# Patient Record
Sex: Female | Born: 1975
Health system: Southern US, Community
[De-identification: ages and names within clinical notes are randomized; demographics above are authoritative.]

## PROBLEM LIST (undated history)

## (undated) DIAGNOSIS — S83249A Other tear of medial meniscus, current injury, unspecified knee, initial encounter: Secondary | ICD-10-CM

## (undated) DIAGNOSIS — M1990 Unspecified osteoarthritis, unspecified site: Secondary | ICD-10-CM

## (undated) DIAGNOSIS — K429 Umbilical hernia without obstruction or gangrene: Secondary | ICD-10-CM

## (undated) DIAGNOSIS — M2550 Pain in unspecified joint: Secondary | ICD-10-CM

## (undated) DIAGNOSIS — G4730 Sleep apnea, unspecified: Secondary | ICD-10-CM

## (undated) DIAGNOSIS — R600 Localized edema: Secondary | ICD-10-CM

## (undated) DIAGNOSIS — M549 Dorsalgia, unspecified: Secondary | ICD-10-CM

## (undated) DIAGNOSIS — T7840XA Allergy, unspecified, initial encounter: Secondary | ICD-10-CM

## (undated) DIAGNOSIS — Z91018 Allergy to other foods: Secondary | ICD-10-CM

## (undated) HISTORY — DX: Allergy to other foods: Z91.018

## (undated) HISTORY — DX: Pain in unspecified joint: M25.50

## (undated) HISTORY — DX: Localized edema: R60.0

## (undated) HISTORY — DX: Dorsalgia, unspecified: M54.9

## (undated) HISTORY — DX: Allergy, unspecified, initial encounter: T78.40XA

## (undated) HISTORY — DX: Umbilical hernia without obstruction or gangrene: K42.9

---

## 2010-03-29 LAB — HIV ANTIBODY (ROUTINE TESTING W REFLEX): HIV: NONREACTIVE

## 2010-03-29 LAB — ABO/RH

## 2010-05-01 LAB — ABO/RH: RH Type: NEGATIVE

## 2010-05-29 ENCOUNTER — Other Ambulatory Visit (HOSPITAL_COMMUNITY): Payer: Self-pay | Admitting: Obstetrics and Gynecology

## 2010-05-29 DIAGNOSIS — IMO0002 Reserved for concepts with insufficient information to code with codable children: Secondary | ICD-10-CM

## 2010-05-29 DIAGNOSIS — Z0489 Encounter for examination and observation for other specified reasons: Secondary | ICD-10-CM

## 2010-06-28 ENCOUNTER — Ambulatory Visit (HOSPITAL_COMMUNITY)
Admission: RE | Admit: 2010-06-28 | Discharge: 2010-06-28 | Disposition: A | Discharge status: Home / Self Care | Payer: BC Managed Care – PPO | Source: Ambulatory Visit | Attending: Obstetrics and Gynecology | Admitting: Obstetrics and Gynecology

## 2010-06-28 DIAGNOSIS — Z1389 Encounter for screening for other disorder: Secondary | ICD-10-CM | POA: Insufficient documentation

## 2010-06-28 DIAGNOSIS — O09519 Supervision of elderly primigravida, unspecified trimester: Secondary | ICD-10-CM | POA: Insufficient documentation

## 2010-06-28 DIAGNOSIS — Z363 Encounter for antenatal screening for malformations: Secondary | ICD-10-CM | POA: Insufficient documentation

## 2010-06-28 DIAGNOSIS — IMO0002 Reserved for concepts with insufficient information to code with codable children: Secondary | ICD-10-CM

## 2010-06-28 DIAGNOSIS — Z0489 Encounter for examination and observation for other specified reasons: Secondary | ICD-10-CM

## 2010-06-28 DIAGNOSIS — O358XX0 Maternal care for other (suspected) fetal abnormality and damage, not applicable or unspecified: Secondary | ICD-10-CM | POA: Insufficient documentation

## 2010-06-28 IMAGING — US US OB DETAIL+14 WK
1 series · 12 of 28 positions shown · non-contrast
Comparison: none

[Series 1: us ob detail +14 wk · 12 of 92 slices shown]
[im 4/92]
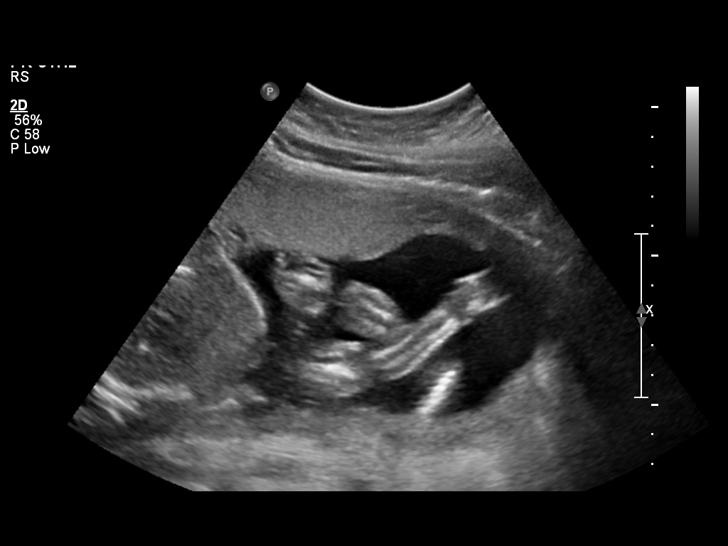
[im 11/92]
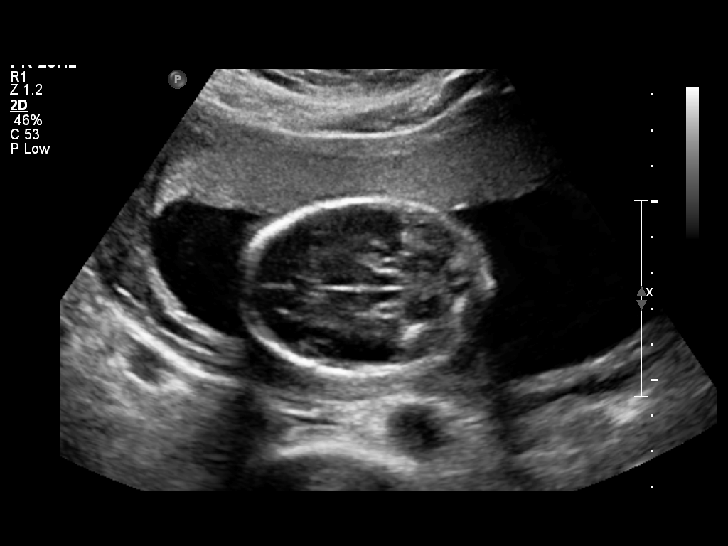
[im 17/92]
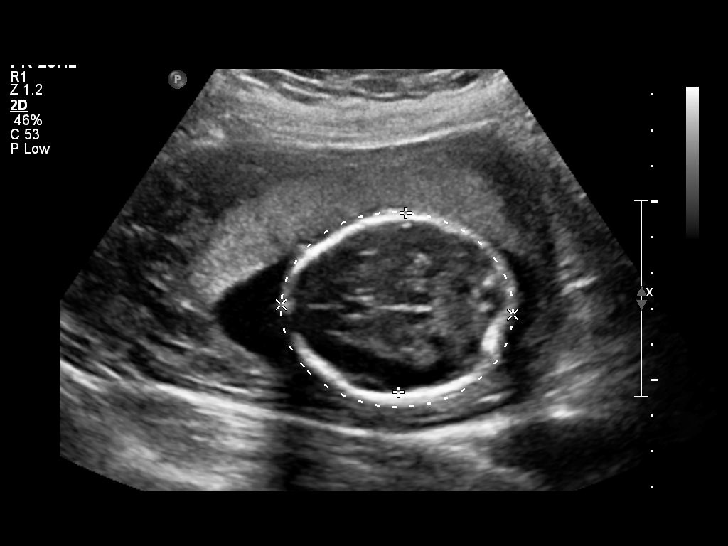
[im 27/92]
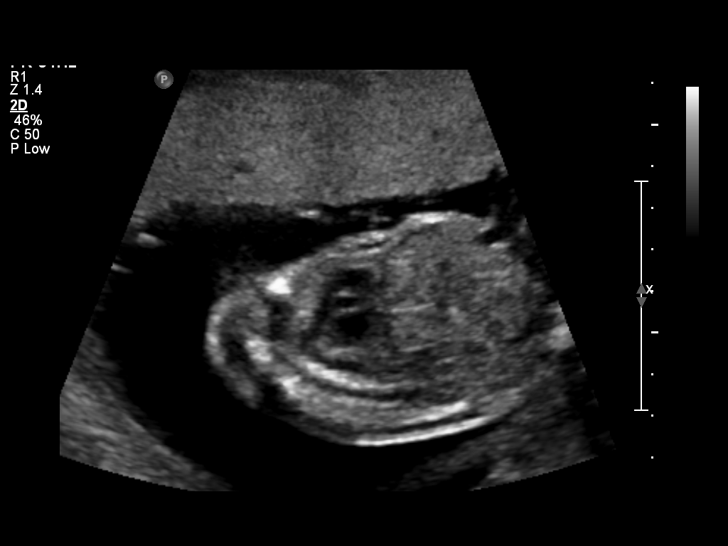
[im 34/92]
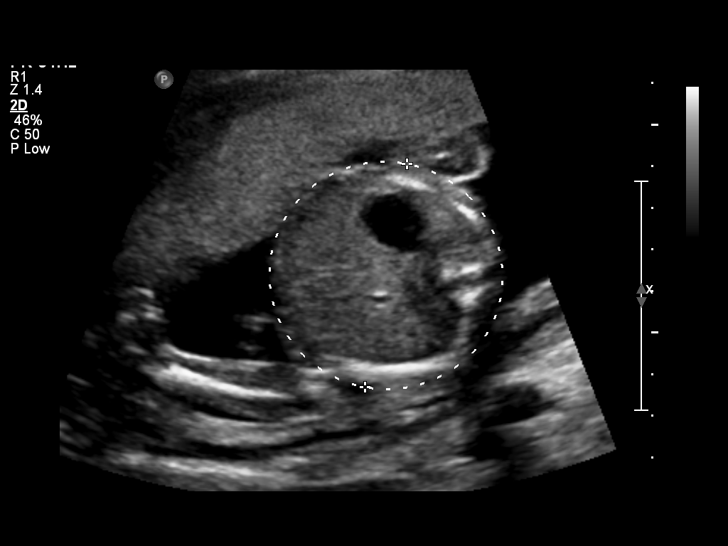
[im 41/92]
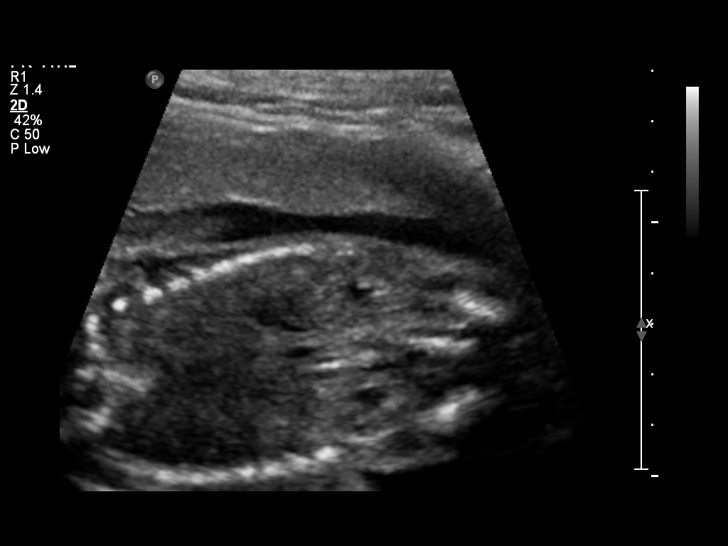
[im 51/92]
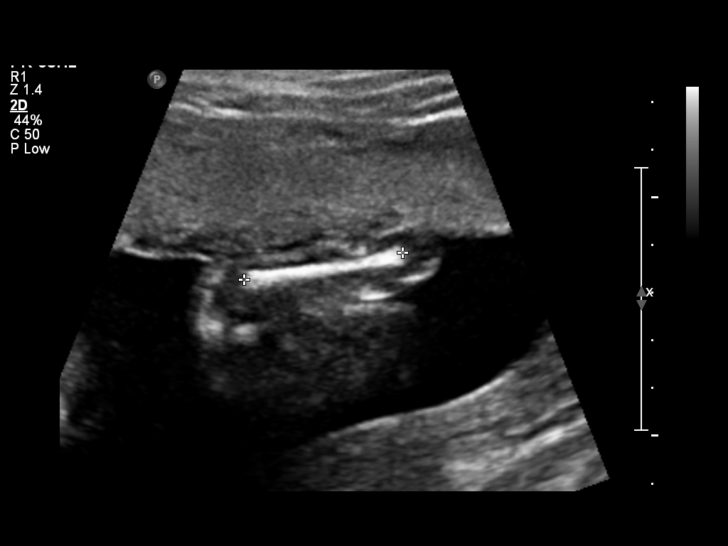
[im 58/92]
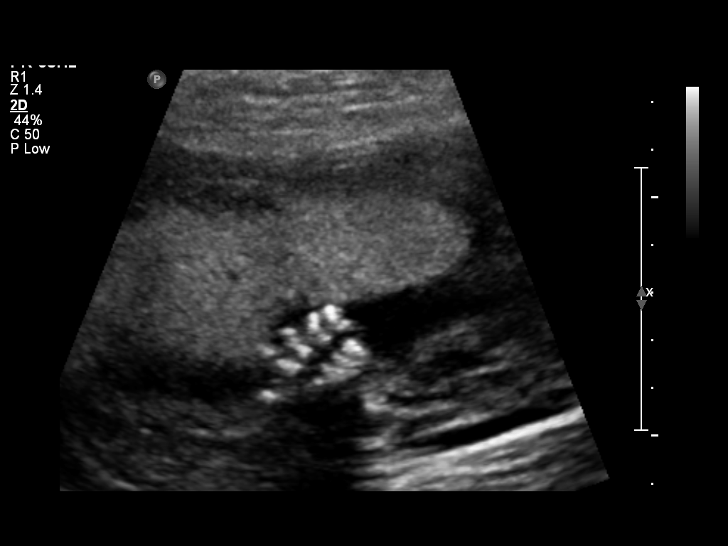
[im 65/92]
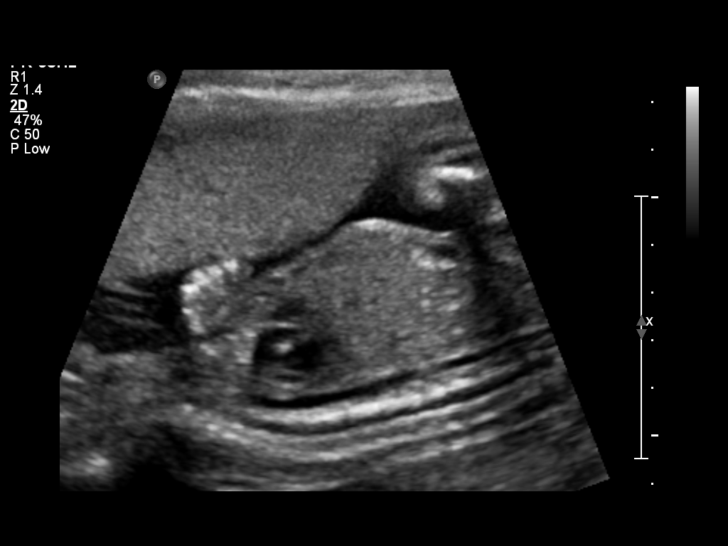
[im 75/92]
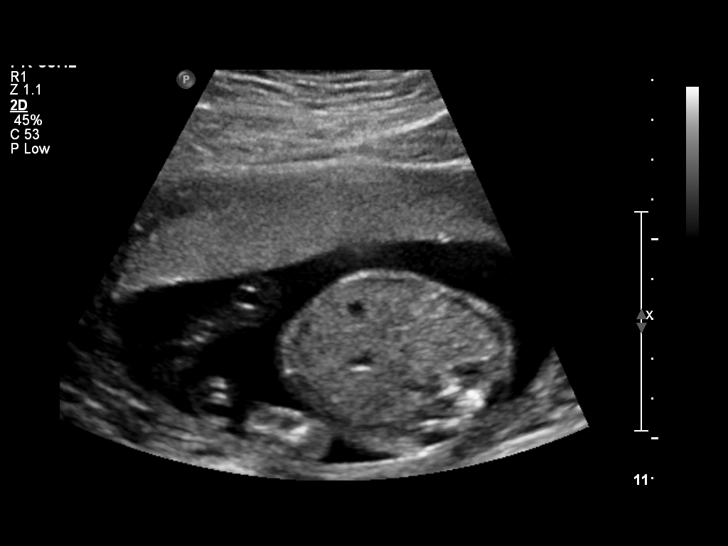
[im 81/92]
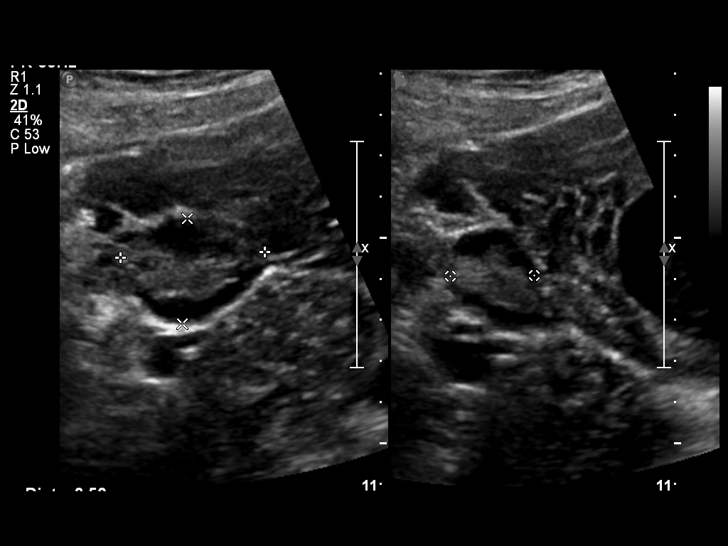
[im 88/92]
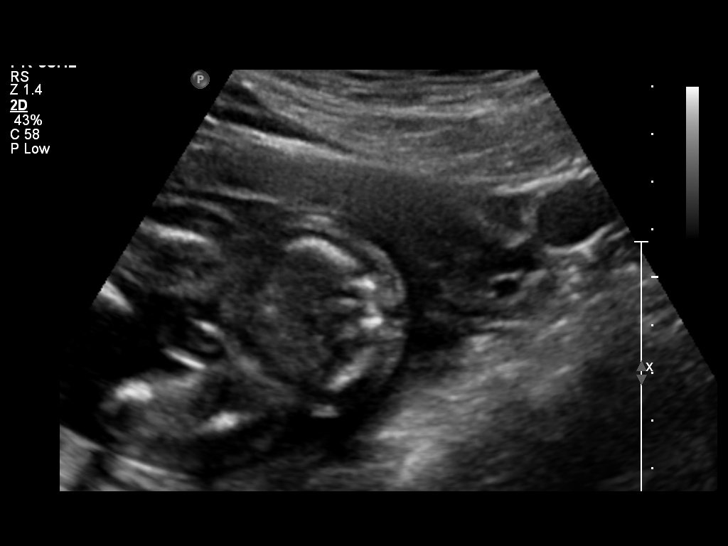

[12 of 28 positions shown; findings below may reference images not displayed]

OBSTETRICS REPORT
                      (Signed Final [DATE] [DATE])

 Order#:         [PHONE_NUMBER]_O
Procedures

 US OB DETAIL + 14 WK                                  [id number]
Indications

 Detailed fetal anatomic survey                        655.83 [LU]
 Advanced maternal age (AMA), Primigravida
Fetal Evaluation

 Fetal Heart Rate:  149                          bpm
 Cardiac Activity:  Observed
 Presentation:      Cephalic
 Placenta:          Anterior, above cervical os
 P. Cord            Visualized
 Insertion:

 Amniotic Fluid
 AFI FV:      Subjectively within normal limits
                                             Larg Pckt:     5.1  cm
Biometry

 BPD:     49.6  mm     G. Age:  21w 0d                CI:        70.39   70 - 86
                                                      FL/HC:      19.5   15.9 -

 HC:     188.5  mm     G. Age:  21w 1d       54  %    HC/AC:      1.11   1.06 -

 AC:     170.3  mm     G. Age:  22w 0d       78  %    FL/BPD:
 FL:      36.8  mm     G. Age:  21w 5d       70  %    FL/AC:      21.6   20 - 24
 HUM:     32.8  mm     G. Age:  21w 0d       52  %

 Est. FW:     449  gm           1 lb     58  %
Gestational Age

 Clinical EDD:  20w 6d                                        EDD:   [DATE]
 U/S Today:     21w 3d                                        EDD:   [DATE]
 Best:          20w 6d     Det. By:  Clinical EDD             EDD:   [DATE]
Genetic Sonogram - Trisomy 21 Screening

 Age:                                             35          Risk=1:   237
 Echogenic bowel:                                 No          LR :
 Hypoplastic/absent Nasal bone:                   No
 Choroid plexus cysts:                            No
 Structural anomalies (inc. cardiac):             No          LR :
 Hypoplastic / absent midphalanx 5th Digit:       No
 Short femur:                                     No          LR :
 Wide space [LU] toes:                         No
 Short humerus:                                   No          LR :
 2-vessel umbilical cord:                         No
 Pyelectasis:                                     No          LR :
 Echogenic cardiac foci:                          No          LR :

 11 Of 11 Criteria Were Visualized and 0 Abnormal(s) Were Seen.
 Ultrasound Modified Risk for Fetal Down Syndrome = [DATE]
Anatomy

 Cranium:           Appears normal      Aortic Arch:       Appears normal
 Fetal Cavum:       Appears normal      Ductal Arch:       Appears normal
 Ventricles:        Appears normal      Diaphragm:         Appears normal
 Choroid Plexus:    Appears normal      Stomach:           Appears
                                                           normal, left
                                                           sided
 Cerebellum:        Appears normal      Abdomen:           Appears normal
 Posterior Fossa:   Appears normal      Abdominal Wall:    Appears nml
                                                           (cord insert,
                                                           abd wall)
 Nuchal Fold:       Not applicable      Cord Vessels:      Appears normal
                    (>20 wks GA)                           (3 vessel cord)
 Face:              Appears normal      Kidneys:           Appear normal
                    (lips/profile/orbit
                    s)
 Heart:             Appears normal      Bladder:           Appears normal
                    (4 chamber &
                    axis)
 RVOT:              Appears normal      Spine:             Appears normal
 LVOT:              Appears normal      Limbs:             Appears normal
                                                           (hands, ankles,
                                                           feet)

 Other:     Heels and 5th digit appears normal. Fetus appears to be
            a female.
Cervix Uterus Adnexa

 Cervical Length:    5.1      cm

 Cervix:       Normal appearance by transabdominal scan.
 Uterus:       No abnormality visualized.
 Cul De Sac:   No free fluid seen.
 Left Ovary:    Within normal limits.
 Right Ovary:   Within normal limits.
 Adnexa:     No abnormality visualized.
Impression

  Single living intrauterine gestation with concordant
 gestational age and normal visualized anatomy. No
 sonographic markers for aneuploidy visualized;  see above
 for US modified Trisomy 21 risk calculation.

## 2010-08-21 LAB — STREP B DNA PROBE: GBS: NEGATIVE

## 2010-08-21 LAB — RPR: RPR: NONREACTIVE

## 2010-08-21 LAB — CBC: Platelets: 211 10*3/uL (ref 150–399)

## 2010-11-11 ENCOUNTER — Inpatient Hospital Stay (HOSPITAL_COMMUNITY)
Admission: AD | Admit: 2010-11-11 | Discharge: 2010-11-14 | DRG: 371 | Disposition: A | Discharge status: Home / Self Care | Payer: BC Managed Care – PPO | Source: Ambulatory Visit | Attending: Obstetrics & Gynecology | Admitting: Obstetrics & Gynecology

## 2010-11-11 ENCOUNTER — Encounter (HOSPITAL_COMMUNITY): Payer: Self-pay | Admitting: Family Medicine

## 2010-11-11 DIAGNOSIS — O09519 Supervision of elderly primigravida, unspecified trimester: Secondary | ICD-10-CM | POA: Diagnosis present

## 2010-11-11 DIAGNOSIS — Z9889 Other specified postprocedural states: Secondary | ICD-10-CM

## 2010-11-11 DIAGNOSIS — O34599 Maternal care for other abnormalities of gravid uterus, unspecified trimester: Secondary | ICD-10-CM | POA: Diagnosis present

## 2010-11-11 DIAGNOSIS — N83209 Unspecified ovarian cyst, unspecified side: Secondary | ICD-10-CM | POA: Diagnosis present

## 2010-11-11 DIAGNOSIS — O3660X0 Maternal care for excessive fetal growth, unspecified trimester, not applicable or unspecified: Principal | ICD-10-CM | POA: Diagnosis present

## 2010-11-11 HISTORY — DX: Sleep apnea, unspecified: G47.30

## 2010-11-11 LAB — CBC
Hemoglobin: 14.4 g/dL (ref 12.0–15.0)
MCH: 32.4 pg (ref 26.0–34.0)
Platelets: 194 10*3/uL (ref 150–400)
RBC: 4.44 MIL/uL (ref 3.87–5.11)
WBC: 14.2 10*3/uL — ABNORMAL HIGH (ref 4.0–10.5)

## 2010-11-11 LAB — COMPREHENSIVE METABOLIC PANEL
ALT: 14 U/L (ref 0–35)
Alkaline Phosphatase: 96 U/L (ref 39–117)
CO2: 21 mEq/L (ref 19–32)
GFR calc Af Amer: >60 mL/min (ref >60)
Glucose, Bld: 100 mg/dL — ABNORMAL HIGH (ref 70–99)
Potassium: 3.7 mEq/L (ref 3.5–5.1)
Sodium: 136 mEq/L (ref 135–145)
Total Protein: 6.3 g/dL (ref 6.0–8.3)

## 2010-11-11 MED ORDER — ACETAMINOPHEN 325 MG PO TABS: 650.0000 mg | ORAL_TABLET | ORAL | Status: DC | PRN

## 2010-11-11 MED ORDER — IBUPROFEN 600 MG PO TABS
600.0000 mg | ORAL_TABLET | Freq: Four times a day (QID) | ORAL | Status: DC | PRN
  Filled 2010-11-11: qty 1

## 2010-11-11 MED ORDER — OXYCODONE-ACETAMINOPHEN 5-325 MG PO TABS: 2.0000 | ORAL_TABLET | ORAL | Status: DC | PRN

## 2010-11-11 MED ORDER — LACTATED RINGERS IV SOLN: 500.0000 mL | INTRAVENOUS | Status: DC | PRN

## 2010-11-11 MED ORDER — ONDANSETRON HCL 4 MG/2ML IJ SOLN: 4.0000 mg | Freq: Four times a day (QID) | INTRAMUSCULAR | Status: DC | PRN

## 2010-11-11 MED ORDER — OXYTOCIN BOLUS FROM INFUSION
500.0000 mL | Freq: Once | INTRAVENOUS | Status: DC
  Filled 2010-11-11: qty 500

## 2010-11-11 MED ORDER — OXYTOCIN 20 UNITS IN LACTATED RINGERS INFUSION - SIMPLE: 125.0000 mL/h | INTRAVENOUS | Status: AC

## 2010-11-11 MED ORDER — BUTORPHANOL TARTRATE 2 MG/ML IJ SOLN
1.0000 mg | INTRAMUSCULAR | Status: DC | PRN
  Administered 2010-11-11: 1 mg via INTRAVENOUS
  Filled 2010-11-11: qty 1

## 2010-11-11 MED ORDER — LIDOCAINE HCL (PF) 1 % IJ SOLN
30.0000 mL | INTRAMUSCULAR | Status: DC | PRN
  Filled 2010-11-11: qty 30

## 2010-11-11 MED ORDER — CITRIC ACID-SODIUM CITRATE 334-500 MG/5ML PO SOLN
30.0000 mL | ORAL | Status: DC | PRN
  Administered 2010-11-12: 30 mL via ORAL
  Filled 2010-11-11: qty 15

## 2010-11-11 MED ORDER — LACTATED RINGERS IV SOLN
INTRAVENOUS | Status: DC
  Administered 2010-11-11 – 2010-11-12 (×7): via INTRAVENOUS

## 2010-11-11 MED ORDER — FLEET ENEMA 7-19 GM/118ML RE ENEM: 1.0000 | ENEMA | RECTAL | Status: DC | PRN

## 2010-11-11 NOTE — Progress Notes (Signed)
Dr. Aldona Bar notified of pt status, SVE, FHR, UC pattern, and pt's pain.  Orders received to allow pt to walk for an hour.  Dr. Aldona Bar stated he would be in later this evening to assess pt and recheck pt's cervix.  Will continue to monitor.

## 2010-11-11 NOTE — Progress Notes (Signed)
Post dates, will assess on BS due to bed status in [facility]

## 2010-11-11 NOTE — H&P (Signed)
  35 yo G1P0 at 40wk 2days "put thru" to L&D with contractions several hours ago.  Pregnancy relatively benign.  Obese.  U/S by history 10 days ago in office suggested EFW of 8 pounds 6 oz, vtx, adeq AF.  Has been contracting all day and had recent increase in quality of contractions.  -GBS.  VS BP 140/90 range. Abd S+41 FH reactive CX 2/70/vtx -3  (was 1.5/50 2 hours ago) Brawny edema LE  Imp:  40+ weeks/probable early labor/probably LGA baby/mild elevation of BP Plan:  Admit/ etc.  Will check "PIH" labs.  Epidural when 3+ if desired.

## 2010-11-12 ENCOUNTER — Inpatient Hospital Stay (HOSPITAL_COMMUNITY): Payer: BC Managed Care – PPO | Admitting: Anesthesiology

## 2010-11-12 ENCOUNTER — Encounter (HOSPITAL_COMMUNITY)
Admission: AD | Disposition: A | Discharge status: Home / Self Care | Payer: Self-pay | Source: Ambulatory Visit | Attending: Obstetrics & Gynecology

## 2010-11-12 ENCOUNTER — Encounter (HOSPITAL_COMMUNITY): Payer: Self-pay | Admitting: Anesthesiology

## 2010-11-12 ENCOUNTER — Encounter (HOSPITAL_COMMUNITY): Payer: Self-pay | Admitting: *Deleted

## 2010-11-12 LAB — RPR: RPR Ser Ql: NONREACTIVE

## 2010-11-12 LAB — ABO/RH: ABO/RH(D): A NEG

## 2010-11-12 SURGERY — Surgical Case
Anesthesia: Regional | Site: Abdomen | Wound class: Clean Contaminated

## 2010-11-12 MED ORDER — OXYTOCIN 10 UNIT/ML IJ SOLN
INTRAMUSCULAR | Status: AC
Start: 2010-11-12 — End: 2010-11-12
  Filled 2010-11-12: qty 2

## 2010-11-12 MED ORDER — LACTATED RINGERS IV SOLN: INTRAVENOUS | Status: DC

## 2010-11-12 MED ORDER — FENTANYL CITRATE 0.05 MG/ML IJ SOLN
INTRAMUSCULAR | Status: AC
  Filled 2010-11-12: qty 2

## 2010-11-12 MED ORDER — SCOPOLAMINE 1 MG/3DAYS TD PT72: 1.0000 | MEDICATED_PATCH | Freq: Once | TRANSDERMAL | Status: DC

## 2010-11-12 MED ORDER — DIPHENHYDRAMINE HCL 25 MG PO CAPS
25.0000 mg | ORAL_CAPSULE | ORAL | Status: DC | PRN
  Administered 2010-11-12: 25 mg via ORAL
  Filled 2010-11-12: qty 1

## 2010-11-12 MED ORDER — OXYTOCIN 20 UNITS IN LACTATED RINGERS INFUSION - SIMPLE: 125.0000 mL/h | INTRAVENOUS | Status: DC

## 2010-11-12 MED ORDER — NALBUPHINE SYRINGE 5 MG/0.5 ML
5.0000 mg | INJECTION | INTRAMUSCULAR | Status: DC | PRN
Start: 2010-11-12 — End: 2010-11-14

## 2010-11-12 MED ORDER — WITCH HAZEL-GLYCERIN EX PADS: 1.0000 "application " | MEDICATED_PAD | CUTANEOUS | Status: DC | PRN

## 2010-11-12 MED ORDER — EPHEDRINE 5 MG/ML INJ
INTRAVENOUS | Status: AC
  Filled 2010-11-12: qty 10

## 2010-11-12 MED ORDER — KETOROLAC TROMETHAMINE 30 MG/ML IJ SOLN: 30.0000 mg | Freq: Four times a day (QID) | INTRAMUSCULAR | Status: DC | PRN

## 2010-11-12 MED ORDER — LIDOCAINE HCL 1.5 % IJ SOLN
INTRAMUSCULAR | Status: DC | PRN
  Administered 2010-11-12 (×2): 5 mL via EPIDURAL
  Administered 2010-11-12: 2 mL via EPIDURAL

## 2010-11-12 MED ORDER — FENTANYL 2.5 MCG/ML BUPIVACAINE 1/10 % EPIDURAL INFUSION (WH - ANES)
14.0000 mL/h | INTRAMUSCULAR | Status: DC
  Administered 2010-11-12 (×2): 14 mL/h via EPIDURAL
  Filled 2010-11-12 (×2): qty 60

## 2010-11-12 MED ORDER — SENNOSIDES-DOCUSATE SODIUM 8.6-50 MG PO TABS
2.0000 | ORAL_TABLET | Freq: Every day | ORAL | Status: DC
  Administered 2010-11-12 – 2010-11-13 (×2): 2 via ORAL

## 2010-11-12 MED ORDER — DIPHENHYDRAMINE HCL 50 MG/ML IJ SOLN: 25.0000 mg | INTRAMUSCULAR | Status: DC | PRN

## 2010-11-12 MED ORDER — PHENYLEPHRINE HCL 10 MG/ML IJ SOLN
INTRAMUSCULAR | Status: DC | PRN
  Administered 2010-11-12: 80 ug via INTRAVENOUS

## 2010-11-12 MED ORDER — METOCLOPRAMIDE HCL 5 MG/ML IJ SOLN: 10.0000 mg | Freq: Three times a day (TID) | INTRAMUSCULAR | Status: DC | PRN

## 2010-11-12 MED ORDER — FENTANYL CITRATE 0.05 MG/ML IJ SOLN
INTRAMUSCULAR | Status: DC | PRN
  Administered 2010-11-12 (×2): 50 ug via INTRAVENOUS

## 2010-11-12 MED ORDER — PHENYLEPHRINE 40 MCG/ML (10ML) SYRINGE FOR IV PUSH (FOR BLOOD PRESSURE SUPPORT)
80.0000 ug | PREFILLED_SYRINGE | INTRAVENOUS | Status: DC | PRN
  Filled 2010-11-12 (×2): qty 5

## 2010-11-12 MED ORDER — DIPHENHYDRAMINE HCL 50 MG/ML IJ SOLN: 12.5000 mg | INTRAMUSCULAR | Status: DC | PRN

## 2010-11-12 MED ORDER — SCOPOLAMINE 1 MG/3DAYS TD PT72
MEDICATED_PATCH | TRANSDERMAL | Status: AC
  Filled 2010-11-12: qty 1

## 2010-11-12 MED ORDER — OXYTOCIN 20 UNITS IN LACTATED RINGERS INFUSION - SIMPLE
INTRAVENOUS | Status: DC | PRN
  Administered 2010-11-12 (×2): 20 [IU] via INTRAVENOUS

## 2010-11-12 MED ORDER — ONDANSETRON HCL 4 MG/2ML IJ SOLN: 4.0000 mg | INTRAMUSCULAR | Status: DC | PRN

## 2010-11-12 MED ORDER — ONDANSETRON HCL 4 MG/2ML IJ SOLN
INTRAMUSCULAR | Status: AC
  Filled 2010-11-12: qty 2

## 2010-11-12 MED ORDER — ONDANSETRON HCL 4 MG PO TABS: 4.0000 mg | ORAL_TABLET | ORAL | Status: DC | PRN

## 2010-11-12 MED ORDER — MORPHINE SULFATE (PF) 0.5 MG/ML IJ SOLN
INTRAMUSCULAR | Status: DC | PRN
  Administered 2010-11-12: 2 mg via INTRAVENOUS
  Administered 2010-11-12: 3 mg via EPIDURAL

## 2010-11-12 MED ORDER — ZOLPIDEM TARTRATE 5 MG PO TABS: 5.0000 mg | ORAL_TABLET | Freq: Every evening | ORAL | Status: DC | PRN

## 2010-11-12 MED ORDER — KETOROLAC TROMETHAMINE 60 MG/2ML IM SOLN
INTRAMUSCULAR | Status: AC
  Administered 2010-11-12: 60 mg via INTRAMUSCULAR
  Filled 2010-11-12: qty 2

## 2010-11-12 MED ORDER — OXYCODONE-ACETAMINOPHEN 5-325 MG PO TABS
1.0000 | ORAL_TABLET | ORAL | Status: DC | PRN
  Administered 2010-11-13 – 2010-11-14 (×4): 1 via ORAL
  Filled 2010-11-12 (×4): qty 1

## 2010-11-12 MED ORDER — EPHEDRINE 5 MG/ML INJ
10.0000 mg | INTRAVENOUS | Status: DC | PRN
  Filled 2010-11-12: qty 4

## 2010-11-12 MED ORDER — OXYTOCIN 10 UNIT/ML IJ SOLN
INTRAMUSCULAR | Status: AC
  Filled 2010-11-12: qty 2

## 2010-11-12 MED ORDER — SODIUM BICARBONATE 8.4 % IV SOLN
INTRAVENOUS | Status: AC
  Filled 2010-11-12: qty 50

## 2010-11-12 MED ORDER — KETOROLAC TROMETHAMINE 60 MG/2ML IM SOLN
60.0000 mg | Freq: Once | INTRAMUSCULAR | Status: AC | PRN
  Administered 2010-11-12: 60 mg via INTRAMUSCULAR

## 2010-11-12 MED ORDER — PRENATAL PLUS 27-1 MG PO TABS
1.0000 | ORAL_TABLET | Freq: Every day | ORAL | Status: DC
  Administered 2010-11-13 – 2010-11-14 (×2): 1 via ORAL
  Filled 2010-11-12 (×2): qty 1

## 2010-11-12 MED ORDER — SODIUM BICARBONATE 8.4 % IV SOLN
INTRAVENOUS | Status: DC | PRN
  Administered 2010-11-12: 5 mL via EPIDURAL

## 2010-11-12 MED ORDER — SODIUM CHLORIDE 0.9 % IV SOLN: 1.0000 ug/kg/h | INTRAVENOUS | Status: DC | PRN

## 2010-11-12 MED ORDER — SODIUM CHLORIDE 0.9 % IJ SOLN: 3.0000 mL | INTRAMUSCULAR | Status: DC | PRN

## 2010-11-12 MED ORDER — PHENYLEPHRINE 40 MCG/ML (10ML) SYRINGE FOR IV PUSH (FOR BLOOD PRESSURE SUPPORT)
80.0000 ug | PREFILLED_SYRINGE | INTRAVENOUS | Status: DC | PRN
  Filled 2010-11-12: qty 5

## 2010-11-12 MED ORDER — LACTATED RINGERS IV SOLN
500.0000 mL | Freq: Once | INTRAVENOUS | Status: AC
  Administered 2010-11-12: 500 mL via INTRAVENOUS

## 2010-11-12 MED ORDER — NALOXONE HCL 0.4 MG/ML IJ SOLN: 0.4000 mg | INTRAMUSCULAR | Status: DC | PRN

## 2010-11-12 MED ORDER — PHENYLEPHRINE 40 MCG/ML (10ML) SYRINGE FOR IV PUSH (FOR BLOOD PRESSURE SUPPORT)
PREFILLED_SYRINGE | INTRAVENOUS | Status: AC
  Filled 2010-11-12: qty 5

## 2010-11-12 MED ORDER — IBUPROFEN 600 MG PO TABS
600.0000 mg | ORAL_TABLET | Freq: Four times a day (QID) | ORAL | Status: DC
  Administered 2010-11-12 – 2010-11-14 (×7): 600 mg via ORAL
  Filled 2010-11-12 (×5): qty 1

## 2010-11-12 MED ORDER — ONDANSETRON HCL 4 MG/2ML IJ SOLN
INTRAMUSCULAR | Status: DC | PRN
  Administered 2010-11-12: 4 mg via INTRAVENOUS

## 2010-11-12 MED ORDER — EPHEDRINE 5 MG/ML INJ
10.0000 mg | INTRAVENOUS | Status: DC | PRN
  Filled 2010-11-12 (×2): qty 4

## 2010-11-12 MED ORDER — TETANUS-DIPHTH-ACELL PERTUSSIS 5-2.5-18.5 LF-MCG/0.5 IM SUSP
0.5000 mL | Freq: Once | INTRAMUSCULAR | Status: AC
  Administered 2010-11-13: 0.5 mL via INTRAMUSCULAR

## 2010-11-12 MED ORDER — IBUPROFEN 600 MG PO TABS
600.0000 mg | ORAL_TABLET | Freq: Four times a day (QID) | ORAL | Status: DC | PRN
  Filled 2010-11-12: qty 1

## 2010-11-12 MED ORDER — SIMETHICONE 80 MG PO CHEW
80.0000 mg | CHEWABLE_TABLET | Freq: Three times a day (TID) | ORAL | Status: DC
  Administered 2010-11-12 – 2010-11-14 (×8): 80 mg via ORAL

## 2010-11-12 MED ORDER — CEFAZOLIN SODIUM 1-5 GM-% IV SOLN
INTRAVENOUS | Status: AC
  Filled 2010-11-12: qty 100

## 2010-11-12 MED ORDER — LIDOCAINE-EPINEPHRINE (PF) 2 %-1:200000 IJ SOLN
INTRAMUSCULAR | Status: AC
  Filled 2010-11-12: qty 20

## 2010-11-12 MED ORDER — ONDANSETRON HCL 4 MG/2ML IJ SOLN: 4.0000 mg | Freq: Three times a day (TID) | INTRAMUSCULAR | Status: DC | PRN

## 2010-11-12 MED ORDER — DIBUCAINE 1 % RE OINT: 1.0000 "application " | TOPICAL_OINTMENT | RECTAL | Status: DC | PRN

## 2010-11-12 MED ORDER — MENTHOL 3 MG MT LOZG: 1.0000 | LOZENGE | OROMUCOSAL | Status: DC | PRN

## 2010-11-12 MED ORDER — LANOLIN HYDROUS EX OINT: 1.0000 "application " | TOPICAL_OINTMENT | CUTANEOUS | Status: DC | PRN

## 2010-11-12 MED ORDER — CEFAZOLIN SODIUM 1-5 GM-% IV SOLN
INTRAVENOUS | Status: DC | PRN
  Administered 2010-11-12: 2 g via INTRAVENOUS

## 2010-11-12 MED ORDER — MORPHINE SULFATE 0.5 MG/ML IJ SOLN
INTRAMUSCULAR | Status: AC
  Filled 2010-11-12: qty 10

## 2010-11-12 MED ORDER — SIMETHICONE 80 MG PO CHEW: 80.0000 mg | CHEWABLE_TABLET | ORAL | Status: DC | PRN

## 2010-11-12 MED ORDER — DIPHENHYDRAMINE HCL 25 MG PO CAPS: 25.0000 mg | ORAL_CAPSULE | Freq: Four times a day (QID) | ORAL | Status: DC | PRN

## 2010-11-12 SURGICAL SUPPLY — 28 items
CLOTH BEACON ORANGE TIMEOUT ST (SAFETY) ×2 IMPLANT
DRESSING TELFA 8X3 (GAUZE/BANDAGES/DRESSINGS) ×2 IMPLANT
DRSG COVADERM 4X10 (GAUZE/BANDAGES/DRESSINGS) ×2 IMPLANT
ELECT REM PT RETURN 9FT ADLT (ELECTROSURGICAL) ×2
ELECTRODE REM PT RTRN 9FT ADLT (ELECTROSURGICAL) ×1 IMPLANT
EXTRACTOR VACUUM M CUP 4 TUBE (SUCTIONS) ×2 IMPLANT
GAUZE SPONGE 4X4 12PLY STRL LF (GAUZE/BANDAGES/DRESSINGS) ×4 IMPLANT
GLOVE BIO SURGEON STRL SZ7 (GLOVE) ×4 IMPLANT
GOWN PREVENTION PLUS LG XLONG (DISPOSABLE) ×4 IMPLANT
KIT ABG SYR 3ML LUER SLIP (SYRINGE) IMPLANT
NEEDLE HYPO 25X5/8 SAFETYGLIDE (NEEDLE) IMPLANT
NS IRRIG 1000ML POUR BTL (IV SOLUTION) ×2 IMPLANT
PACK C SECTION WH (CUSTOM PROCEDURE TRAY) ×2 IMPLANT
PAD ABD 7.5X8 STRL (GAUZE/BANDAGES/DRESSINGS) ×2 IMPLANT
PAD ABD DERMACEA PRESS 5X9 (GAUZE/BANDAGES/DRESSINGS) ×2 IMPLANT
RTRCTR C-SECT PINK 25CM LRG (MISCELLANEOUS) ×2 IMPLANT
RTRCTR C-SECT PINK 34CM XLRG (MISCELLANEOUS) IMPLANT
SLEEVE SCD COMPRESS KNEE MED (MISCELLANEOUS) IMPLANT
SPONGE LAP 18X18 X RAY DECT (DISPOSABLE) ×2 IMPLANT
STAPLER VISISTAT 35W (STAPLE) IMPLANT
SUT CHROMIC 1 CTX 36 (SUTURE) ×6 IMPLANT
SUT PDS AB 0 CTX 60 (SUTURE) ×2 IMPLANT
SUT PLAIN 2 0 XLH (SUTURE) ×2 IMPLANT
SUT VIC AB 2-0 CT1 27 (SUTURE) ×1
SUT VIC AB 2-0 CT1 TAPERPNT 27 (SUTURE) ×1 IMPLANT
TOWEL OR 17X24 6PK STRL BLUE (TOWEL DISPOSABLE) ×4 IMPLANT
TRAY FOLEY CATH 14FR (SET/KITS/TRAYS/PACK) ×2 IMPLANT
WATER STERILE IRR 1000ML POUR (IV SOLUTION) ×2 IMPLANT

## 2010-11-12 NOTE — Progress Notes (Signed)
UR chart review completed.  

## 2010-11-12 NOTE — Anesthesia Procedure Notes (Signed)
Epidural Patient location during procedure: OB Start time: 11/12/2010 1:23 AM Reason for block: procedure for pain  Staffing Performed by: anesthesiologist   Preanesthetic Checklist Completed: patient identified, site marked, surgical consent, pre-op evaluation, timeout performed, IV checked, risks and benefits discussed and monitors and equipment checked  Epidural Patient position: sitting Prep: site prepped and draped and DuraPrep Patient monitoring: continuous pulse ox and blood pressure Approach: midline Injection technique: LOR air  Needle:  Needle type: Tuohy  Needle gauge: 17 G Needle length: 9 cm Needle insertion depth: 5 cm cm Catheter type: closed end flexible Catheter size: 19 Gauge Catheter at skin depth: 10 cm Test dose: negative  Assessment Events: blood not aspirated, injection not painful, no injection resistance, negative IV test and no paresthesia  Additional Notes Discussed risk of headache, infection, bleeding, nerve injury and failed or incomplete block.  Patient voices understanding and wishes to proceed.

## 2010-11-12 NOTE — Progress Notes (Signed)
Katie Glass is a 35 y.o. G1P0000 at [redacted]w[redacted]d by LMP admitted for active labor  Subjective: Comfortable with epidural  Objective: BP 130/65  Pulse 105  Temp(Src) 98.2 F (36.8 C) (Oral)  Resp 18  Ht 5\' 6"  (1.676 m)  Wt 126.1 kg (278 lb)  BMI 44.87 kg/m2      FHT:  FHR: 140 bpm, variability: moderate,  accelerations:  Present,  decelerations:  Absent UC:   regular, every 2 minutes SVE:   Dilation: 6 Effacement (%): 80 Station: -2;-3 Exam by:: Dr Tenny Craw  Labs: Lab Results  Component Value Date   WBC 14.2* 11/11/2010   HGB 14.4 11/11/2010   HCT 41.4 11/11/2010   MCV 93.2 11/11/2010   PLT 194 11/11/2010    Assessment / Plan: Protracted active phase, arrest of progression  Labor: Cervical dilation now regressing. Pt had been 7-8 cm dilated and now cervix is 6 cm with edema  Fetal Wellbeing:  Category I Pain Control:  Epidural Anticipated MOD:  Discussed findings with patient and husband and concern for regression of cervical dilation.  Given this a c-section was recommended. R/B/A were reviewed with patient and her husband.  Will proceed with c-section.  Katie Parveen H. 11/12/2010, 8:36 AM

## 2010-11-12 NOTE — Op Note (Signed)
Preoperative diagnosis: 1) suspected macrosomia, 2) failure to progress, 3) maternal obesity 4) advanced maternal age Postoperative diagnosis: Same and bilateral theica lutein ovarian cysts Procedure: primary low transverse cesarean section via Pfannenstiel skin incision Surgeon: Dr. Waynard Reeds Asst. None Anesthesia: Epidural Operative findings: vigorous female infant in the vertex presentation weighing 9 lbs. 7 oz. With Apgar scores of 9 at 1 minute and 9 at 5 minutes. Bilateral theica lutein ovarian cysts were noted. Fallopian tubes were normal in appearance as was the uterus. EBL: 800 cc UOP: 200 cc Specimen: Placenta, for disposal Procedure: Katie Glass is a 35 year old gravida 1 para 0 who presented at 40 weeks and 2 days estimated gestational age in labor. Her pregnancy has been complicated by maternal obstructive sleep apnea, advanced maternal age, and obesity. An ultrasound for estimated fetal weight was obtained the week prior to admission and demonstrated an estimated fetal weight of 8 pounds and 6 ounces. When she presented in labor her cervix is 1-2 cm dilated. She progressed to approximately 7-8 cm dilated, however 2 subsequent cervical exams demonstrated progression in cervical dilation to 6 cm, with significant edema noted of the cervix. Additionally the vertex was noted to be -2-3 station. Given the lack of cervical dilation and in fact regression of cervical dilation, and the failure of descent of the fetal vertex and the concern for macrosomic infant the decision was made to proceed with primary cesarean section. Risks benefits and alternatives of the procedure were discussed with the patient and her husband and informed consent was obtained. The patient was taken to the operating room where epidural anesthesia was confirmed to be adequate. She was placed in the dorsal supine position with a leftward tilt. The abdomen was prepped and draped in the normal sterile fashion. A scalpel was  then used to make a Pfannenstiel skin incision which was carried down to the underlying layers of soft tissue to the fascia. The fascial incision was extended laterally with Mayo scissors. The superficial aspect of the fascial incision was grasped with Coker clamps x2 tented up and the underlying rectus muscle was dissected off sharp sharply with the Bovie. The rectus muscles were then separated in the midline, the abdominal peritoneum was identified and entered bluntly. The incision was extended superiorly and inferiorly with good visualization of the bladder. The Alexis retractor was deployed. The vesicouterine peritoneum was identified, tented up, entered sharply with the Metzenbaum scissors, and the incision was extended laterally. The bladder flap was created digitally. The scalpel was used to make a low transverse uterine incision which was extended laterally with blunt dissection. The fetal vertex was identified, brought up to the uterine incision, the vacuum was engaged on the fetal vertex. With the assistance of the vacuum, the infant's head was delivered easily through the uterine incision followed by the body. The infant cried vigorously on the operative field, the cord was clamped and cut and the infant was passed to the waiting pediatricians. The placenta was then manually extracted, the uterus was cleared of all clot and debris. The uterine incision was repaired with #1 chromic in a running locked fashion followed by a second imbricating layer. Several figure-of-eight sutures were required to achieve complete hemostasis. The ovaries were inspected and found to have a multicystic appearance consistent with theca lutein cysts. The abdominal cavity was irrigated and cleared of all clot and debris. The abdominal peritoneum was reapproximated with 2-0 Vicryl. The rectus muscles were reapproximated with 1 chromic in a running fashion appear. The  fascia was closed with oh loop PDS. The subcutaneous layer was  reapproximated with 2-0 plain gut interrupted sutures and the skin was closed with staples. All sponge lap needle counts were correct x2. Patient tolerated the procedure well and was brought to the recovery room in stable condition following the procedure.

## 2010-11-12 NOTE — Consult Note (Signed)
Called to attend primary C/S for failure to progress with h/o light MSF at ROM today. No other risk factors reported.  At birth infant was in vertex and delivery was vacuum assisted with spontaneous cries at birth and MAE. No dysmorphic features. Given tactile stim and bulb suction. Normal transition. Shown to parents then carried to Transitional Nursery by father. Care to [facility].   Dagoberto Ligas MD [facility]

## 2010-11-12 NOTE — Anesthesia Preprocedure Evaluation (Addendum)
Anesthesia Evaluation  Name, MR# and DOB Patient awake  General Assessment Comment  Reviewed: Allergy & Precautions, H&P , NPO status , Patient's Chart, lab work & pertinent test results, reviewed documented beta blocker date and time   History of Anesthesia Complications Negative for: history of anesthetic complications  Airway Mallampati: III TM Distance: >3 FB Neck ROM: full    Dental  (+) Teeth Intact   Pulmonary  sleep apnea and Continuous Positive Airway Pressure Ventilation  clear to auscultation  breath sounds clear to auscultation none    Cardiovascular regular Normal    Neuro/Psych Negative Neurological ROS  Negative Psych ROS  GI/Hepatic/Renal negative GI ROS  negative Liver ROS  negative Renal ROS        Endo/Other  (+)   Morbid obesity  Abdominal   Musculoskeletal   Hematology negative hematology ROS (+)   Peds  Reproductive/Obstetrics (+) Pregnancy    Anesthesia Other Findings             Anesthesia Physical Anesthesia Plan  ASA: III  Anesthesia Plan: Epidural   Post-op Pain Management:    Induction:   Airway Management Planned:   Additional Equipment:   Intra-op Plan:   Post-operative Plan:   Informed Consent: I have reviewed the patients History and Physical, chart, labs and discussed the procedure including the risks, benefits and alternatives for the proposed anesthesia with the patient or authorized representative who has indicated his/her understanding and acceptance.     Plan Discussed with:   Anesthesia Plan Comments:        Anesthesia Quick Evaluation

## 2010-11-12 NOTE — Transfer of Care (Signed)
Immediate Anesthesia Transfer of Care Note  Patient: Katie Glass  Procedure(s) Performed:  CESAREAN SECTION  Patient Location: PACU  Anesthesia Type: Spinal  Level of Consciousness: awake, alert  and oriented  Airway & Oxygen Therapy: Patient Spontanous Breathing  Post-op Assessment: Report given to PACU RN  Post vital signs: stable  Complications: No apparent anesthesia complications

## 2010-11-13 LAB — CBC
Hemoglobin: 11.3 g/dL — ABNORMAL LOW (ref 12.0–15.0)
MCH: 32.1 pg (ref 26.0–34.0)
MCV: 95.5 fL (ref 78.0–100.0)
RBC: 3.52 MIL/uL — ABNORMAL LOW (ref 3.87–5.11)
WBC: 14.5 10*3/uL — ABNORMAL HIGH (ref 4.0–10.5)

## 2010-11-13 MED ORDER — RHO D IMMUNE GLOBULIN 1500 UNIT/2ML IJ SOLN
300.0000 ug | Freq: Once | INTRAMUSCULAR | Status: AC
  Administered 2010-11-13: 300 ug via INTRAMUSCULAR
  Filled 2010-11-13: qty 2

## 2010-11-13 NOTE — Progress Notes (Signed)
  Post Op Day 1 Subjective: no complaints, up ad lib, voiding and tolerating PO  Objective: Blood pressure 114/63, pulse 95, temperature 98.2 F (36.8 C), temperature source Oral, resp. rate 19, height 5\' 6"  (1.676 m), weight 126.1 kg (278 lb), SpO2 95.00%, unknown if currently breastfeeding.  Physical Exam:  General: alert Lochia: appropriate Uterine Fundus: firm Incision: no significant drainage   Basename 11/13/10 0640 11/11/10 2105  HGB 11.3* 14.4  HCT 33.6* 41.4    Assessment/Plan: Continue post op observation   LOS: 2 days   Katie Glass D 11/13/2010, 9:00 AM

## 2010-11-14 LAB — RH IG WORKUP (INCLUDES ABO/RH)
Antibody Screen: NEGATIVE
Fetal Screen: NEGATIVE
Gestational Age(Wks): 40

## 2010-11-14 MED ORDER — OXYCODONE-ACETAMINOPHEN 5-325 MG PO TABS: 1.0000 | ORAL_TABLET | ORAL | Status: AC | PRN

## 2010-11-14 NOTE — Anesthesia Postprocedure Evaluation (Signed)
Anesthesia Post Note  Patient: Katie Glass  Procedure(s) Performed:  CESAREAN SECTION This patient has recovered from her surgery , I am not aware of any complications or problems.

## 2010-11-14 NOTE — Discharge Summary (Signed)
Obstetric Discharge Summary Reason for Admission: labor Prenatal Procedures: none Intrapartum Procedures: cesarean: low cervical, transverse for AOAP by Dr. Waynard Reeds Postpartum Procedures: Rho(D) Ig Complications-Operative and Postpartum: none  HOSPITAL COURSE:  Uncomplicated and routine, home POD 2.  Hemoglobin  Date Value Range Status  11/13/2010 11.3* 12.0-15.0 (g/dL) Final     DELTA CHECK NOTED     REPEATED TO VERIFY     HCT  Date Value Range Status  11/13/2010 33.6* 36.0-46.0 (%) Final    Discharge Diagnoses: Term Pregnancy-delivered  Discharge Information: Date: 11/14/2010 Activity: pelvic rest Diet: routine Medications: Percocet Condition: stable Instructions: refer to practice specific booklet Discharge to: home   Newborn Data: Live born female  Birth Weight: 9 lb 7 oz (4281 g) APGAR: 9, 9  Home with mother.  Katie Glass A 11/14/2010, 8:12 AM

## 2010-11-14 NOTE — Progress Notes (Addendum)
  Patient is eating, ambulating, voiding.  Pain control is good.  Filed Vitals:   11/13/10 0930 11/13/10 1700 11/13/10 2050 11/14/10 0529  BP: 113/74 121/78 131/76 133/91  Pulse: 103 100 111 114  Temp: 98.3 F (36.8 C) 98.7 F (37.1 C) 98.4 F (36.9 C) 97.9 F (36.6 C)  TempSrc: Oral Oral Oral Oral  Resp: 18 20 18 18   Height:      Weight:      SpO2: 95%  98%     lungs:   clear to auscultation cor:    RRR Abdomen:  soft, appropriate tenderness, incisions intact and without erythema or exudate ex:    no cords   Lab Results  Component Value Date   WBC 14.5* 11/13/2010   HGB 11.3* 11/13/2010   HCT 33.6* 11/13/2010   MCV 95.5 11/13/2010   PLT 151 11/13/2010    --/--/A NEG (08/22 0640)  A/P    Post operative day 2.  Routine post op and postpartum care.  Expect d/c per plan-today if BP is better over next two readings.  Percocet for pain control.

## 2010-11-19 ENCOUNTER — Encounter (HOSPITAL_COMMUNITY): Payer: BC Managed Care – PPO

## 2010-11-19 ENCOUNTER — Ambulatory Visit (HOSPITAL_COMMUNITY)
Admission: RE | Admit: 2010-11-19 | Discharge: 2010-11-19 | Disposition: A | Discharge status: Home / Self Care | Payer: BC Managed Care – PPO | Source: Ambulatory Visit | Attending: Obstetrics and Gynecology | Admitting: Obstetrics and Gynecology

## 2010-11-19 NOTE — Progress Notes (Signed)
Adult Lactation Consultation Outpatient Visit Note  Patient Name: Katie Glass Date of Birth: Mar 18, 1976 Gestational Age at Delivery: [redacted]w[redacted]d Type of Delivery: c/s  Breastfeeding History: Frequency of Breastfeeding: Every 2-3 hours  Length of Feeding: 15+ minutes Voids: 3-4 per day Stools: 1 per day, mustard colored,  Baby had 2 stools at this visit, mustard color.   Supplementing / Method: Pumping:  Type of Pump:  No supplements or pumping at this time   Frequency:  Volume:    Comments:    Consultation Evaluation:  Initial Feeding Assessment: Pre-feed Weight:  8lb 14.9 oz  4052 gm. Post-feed Weight:  8lb 15.0 oz  4054 gm Amount Transferred:  2 ml Comments:  Baby nursed on left breast for 20 minutes without nipple shield  Additional Feeding Assessment: Pre-feed Weight:  8lb 15.0 oz  4054 gm Post-feed Weight:  9lb 1.4 oz   4122 gm Amount Transferred:  68 ml. Comments:  Baby nursed of right breast with nipple shield for 20 minutes  Additional Feeding Assessment: Pre-feed Weight:  9lb 1.4 oz  4122gm Post-feed Weight:  9lb 1.9 oz  4135 gm Amount Transferred:  14 ml Comments:  Re-latched to left breast using nipple shield and the baby transferred milk better.   Total Breast milk Transferred this Visit:  84 ml. Total Supplement Given:  None  Additional Interventions:     Advised mom at this time, to continue to use the nipple shield with nursing since the baby is transferring milk better using the nipple shield. We discussed ways to slowly wean off the nipple shield and will evaluate this at the next visit. Mom may initiate the feeding with the nipple shield and then take it off near the end of the feeding to start the weaning process. Mom is having soreness with the initial latch, both nipples are cracked. Worked with mom with positioning and obtaining a deeper latch which she reported was helpful. She has been wearing shells for sore nipples and this is causing some edema  at the end of the right nipple. Advised to use shells for inverted nipples instead. Encourage mom to use comfort gels and lanolin which she has at home. Advised mom to  monitor voids and stools, advised baby should be having 6-8 voids per day.      Follow-Up  Tuesday, September 4th at 0900.    Alfred Levins 11/19/2010, 10:28 AM

## 2010-11-26 ENCOUNTER — Ambulatory Visit (HOSPITAL_COMMUNITY)
Admission: RE | Admit: 2010-11-26 | Discharge: 2010-11-26 | Disposition: A | Discharge status: Home / Self Care | Payer: BC Managed Care – PPO | Source: Ambulatory Visit | Attending: Obstetrics and Gynecology | Admitting: Obstetrics and Gynecology

## 2010-11-26 NOTE — Progress Notes (Signed)
Adult Lactation Consultation Outpatient Visit Note  Patient Name: Katie Glass Date of Birth: 12-14-1975 Gestational Age at Delivery: [redacted]w[redacted]d Type of Delivery: c-section del of female, weight 9-7  Breastfeeding History: Frequency of Breastfeeding: every 3 hrs Length of Feeding:  10-60min Voids:  6-7 Stools: 6-7 mustard coloe  Supplementing / Method: Pumping:  Type of Pump: medela electric and hand pump   Frequency: not pumping yet  Volume:    Comments: Mother here to follow for latch difficulty. Left inverted and right swollen. Using#24 nipple shield since she left hospital. Nipples sore , mostly right 8-9 pain scale and 3-4 after 10-30 seconds.    Consultation Evaluation: Mothers right nipple still pink with small scab and complaints of painful latch only on right. Left nipple intact no noted trama.  Observed Katie Glass on right breast with nipple shield. inst mother to use good breast support to maintain good depth. 20-25 mins of good rhythmic sucks. Adjusted chin for more comfort. Katie Glass transferred 54 ml from first breast. Attempted to latch without shield for 3-5 mins, lots of pinching obs.  breastfed on left breast for another 10 mins and transferred 36 ml. For a total of 90 ml. Feeding was amazing on left breast, no discomfort at all. Plan was given to mother to call OB and request All-purpose nipple cream for right nipple and to continue to offer left breast without nipple shield. Inst mother to pre-pump right breast to assist with less pain when infant initially latches. Mother has amazing milk supply. Post pumping not necessary. inst to offer both breast at each feeding allowing Katie Glass to feed longer on first breast. Mother very reassured by amt of milk transferred today. Follow up only as needed.    Initial Feeding Assessment: Pre-feed Weight:   4242gms,  9-5.6 Post-feed Weight:  4296gms, 9-7.6 Amount Transferred:  54ml  Comments:  Additional Feeding Assessment: Pre-feed  Weight:  4296 gms, 9-7.6 Post-feed Weight: 4332gms, 9-8.7  Amount Transferred: 36 ml Comments:  Additional Feeding Assessment: Pre-feed Weight: Post-feed Weight: Amount Transferred: Comments:  Total Breast milk Transferred this Visit: 90 mls Total Supplement Given: n/a  Additional Interventions:   Follow-Up   PRN basis only.    Stevan Born McCoy 11/26/2010, 9:05 AM

## 2010-12-02 ENCOUNTER — Encounter (HOSPITAL_COMMUNITY): Payer: Self-pay | Admitting: Obstetrics and Gynecology

## 2014-01-23 ENCOUNTER — Encounter (HOSPITAL_COMMUNITY): Payer: Self-pay | Admitting: Obstetrics and Gynecology

## 2015-11-05 DIAGNOSIS — G4733 Obstructive sleep apnea (adult) (pediatric): Secondary | ICD-10-CM | POA: Diagnosis not present

## 2015-12-01 ENCOUNTER — Other Ambulatory Visit (HOSPITAL_COMMUNITY)
Admission: RE | Admit: 2015-12-01 | Discharge: 2015-12-01 | Disposition: A | Discharge status: Home / Self Care | Payer: Self-pay | Source: Ambulatory Visit | Attending: Obstetrics and Gynecology | Admitting: Obstetrics and Gynecology

## 2015-12-01 ENCOUNTER — Telehealth: Payer: Self-pay | Admitting: Advanced Practice Midwife

## 2015-12-01 DIAGNOSIS — Z3201 Encounter for pregnancy test, result positive: Secondary | ICD-10-CM | POA: Insufficient documentation

## 2015-12-01 LAB — HCG, QUANTITATIVE, PREGNANCY: hCG, Beta Chain, Quant, S: 1017 m[IU]/mL — ABNORMAL HIGH (ref <5)

## 2015-12-01 NOTE — Telephone Encounter (Signed)
40 y.o. G1P1000 in early pregnancy, 4148w2d by LMP, was sent to lab for outpatient quantitative hcg. Lab ordered per Dr Dareen PianoAnderson.  Pt denies abdominal pain today.  Ectopic and bleeding precautions reviewed with pt prior to lab draw.  Results sent to Dr Dareen PianoAnderson.

## 2015-12-03 DIAGNOSIS — N925 Other specified irregular menstruation: Secondary | ICD-10-CM | POA: Diagnosis not present

## 2015-12-04 DIAGNOSIS — Z6837 Body mass index (BMI) 37.0-37.9, adult: Secondary | ICD-10-CM | POA: Diagnosis not present

## 2015-12-04 DIAGNOSIS — O360990 Maternal care for other rhesus isoimmunization, unspecified trimester, not applicable or unspecified: Secondary | ICD-10-CM | POA: Diagnosis not present

## 2016-05-16 DIAGNOSIS — Z01419 Encounter for gynecological examination (general) (routine) without abnormal findings: Secondary | ICD-10-CM | POA: Diagnosis not present

## 2016-05-16 DIAGNOSIS — Z6839 Body mass index (BMI) 39.0-39.9, adult: Secondary | ICD-10-CM | POA: Diagnosis not present

## 2016-05-16 DIAGNOSIS — Z124 Encounter for screening for malignant neoplasm of cervix: Secondary | ICD-10-CM | POA: Diagnosis not present

## 2016-05-16 DIAGNOSIS — Z3141 Encounter for fertility testing: Secondary | ICD-10-CM | POA: Diagnosis not present

## 2016-05-16 DIAGNOSIS — Z1231 Encounter for screening mammogram for malignant neoplasm of breast: Secondary | ICD-10-CM | POA: Diagnosis not present

## 2016-08-21 DIAGNOSIS — M2550 Pain in unspecified joint: Secondary | ICD-10-CM | POA: Diagnosis not present

## 2016-08-21 DIAGNOSIS — R635 Abnormal weight gain: Secondary | ICD-10-CM | POA: Diagnosis not present

## 2016-08-21 DIAGNOSIS — K219 Gastro-esophageal reflux disease without esophagitis: Secondary | ICD-10-CM | POA: Diagnosis not present

## 2016-08-21 DIAGNOSIS — R51 Headache: Secondary | ICD-10-CM | POA: Diagnosis not present

## 2016-08-25 DIAGNOSIS — F4323 Adjustment disorder with mixed anxiety and depressed mood: Secondary | ICD-10-CM | POA: Diagnosis not present

## 2016-11-04 DIAGNOSIS — M25561 Pain in right knee: Secondary | ICD-10-CM | POA: Diagnosis not present

## 2016-11-05 DIAGNOSIS — G4733 Obstructive sleep apnea (adult) (pediatric): Secondary | ICD-10-CM | POA: Diagnosis not present

## 2016-11-12 DIAGNOSIS — M25561 Pain in right knee: Secondary | ICD-10-CM | POA: Diagnosis not present

## 2016-11-12 DIAGNOSIS — M545 Low back pain: Secondary | ICD-10-CM | POA: Diagnosis not present

## 2016-11-18 DIAGNOSIS — G4733 Obstructive sleep apnea (adult) (pediatric): Secondary | ICD-10-CM | POA: Diagnosis not present

## 2016-11-20 DIAGNOSIS — M25561 Pain in right knee: Secondary | ICD-10-CM | POA: Diagnosis not present

## 2016-12-02 DIAGNOSIS — S83241A Other tear of medial meniscus, current injury, right knee, initial encounter: Secondary | ICD-10-CM | POA: Diagnosis not present

## 2016-12-02 DIAGNOSIS — M545 Low back pain: Secondary | ICD-10-CM | POA: Diagnosis not present

## 2016-12-04 DIAGNOSIS — M545 Low back pain: Secondary | ICD-10-CM | POA: Diagnosis not present

## 2016-12-08 DIAGNOSIS — M545 Low back pain: Secondary | ICD-10-CM | POA: Diagnosis not present

## 2016-12-11 DIAGNOSIS — M545 Low back pain: Secondary | ICD-10-CM | POA: Diagnosis not present

## 2016-12-15 DIAGNOSIS — M545 Low back pain: Secondary | ICD-10-CM | POA: Diagnosis not present

## 2016-12-17 DIAGNOSIS — M545 Low back pain: Secondary | ICD-10-CM | POA: Diagnosis not present

## 2016-12-24 DIAGNOSIS — M545 Low back pain: Secondary | ICD-10-CM | POA: Diagnosis not present

## 2016-12-29 DIAGNOSIS — M545 Low back pain: Secondary | ICD-10-CM | POA: Diagnosis not present

## 2017-01-01 DIAGNOSIS — M545 Low back pain: Secondary | ICD-10-CM | POA: Diagnosis not present

## 2017-01-05 DIAGNOSIS — M545 Low back pain: Secondary | ICD-10-CM | POA: Diagnosis not present

## 2017-01-13 DIAGNOSIS — M545 Low back pain: Secondary | ICD-10-CM | POA: Diagnosis not present

## 2017-02-05 ENCOUNTER — Encounter (HOSPITAL_BASED_OUTPATIENT_CLINIC_OR_DEPARTMENT_OTHER): Payer: Self-pay | Admitting: *Deleted

## 2017-02-06 ENCOUNTER — Other Ambulatory Visit: Payer: Self-pay

## 2017-02-06 ENCOUNTER — Encounter (HOSPITAL_BASED_OUTPATIENT_CLINIC_OR_DEPARTMENT_OTHER): Payer: Self-pay | Admitting: *Deleted

## 2017-02-10 DIAGNOSIS — S83241A Other tear of medial meniscus, current injury, right knee, initial encounter: Secondary | ICD-10-CM | POA: Diagnosis present

## 2017-02-10 NOTE — H&P (Signed)
Katie GottronKathryn Yamin is an 41 y.o. female.   Chief Complaint: Right Knee Pain  HPI: Katie EvansCatherine is here today in consultation from Dr. Althea CharonMcKinley for a posterior horn medial meniscal tear.  She was playing soccer in January.  She works as a Education officer, environmentalpastor.  She tried conservative measures but has not had a cortisone injection at this time.  An MRI scan has been accomplished showing a complex posterior horn medial meniscal tear.  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  She also reports low back pain is had previous x-ray showing mild scoliosis and some very mild degenerative disc disease.  She reports that the knee pain is intermittent, moderate with a dull quality worse with exercise, better with stretching.  Zumba aggravates it.  Past Medical History:  Diagnosis Date  . Arthritis    knees  . Asthma    as a child  . MMT (medial meniscus tear)    right  . Sleep apnea    CPAP nightly    Past Surgical History:  Procedure Laterality Date  . CESAREAN SECTION N/A 11/12/2010   Performed by Almon Herculesoss, Kendra H., [facility]    Family History  Problem Relation Age of Onset  . Hypertension Father   . Cancer Maternal Grandfather   . Heart disease Paternal Grandfather    Social History:  reports that  has never smoked. she has never used smokeless tobacco. She reports that she drinks alcohol. She reports that she does not use drugs.  Allergies: No Known Allergies  No medications prior to admission.    No results found for this or any previous visit (from the past 48 hour(s)). No results found.  Review of Systems  Constitutional: Negative.   HENT: Negative.   Eyes: Negative.   Respiratory: Negative.   Cardiovascular: Negative.   Gastrointestinal: Negative.   Genitourinary: Negative.   Musculoskeletal: Positive for back pain, joint pain and myalgias.  Skin: Negative.   Neurological: Negative.   Endo/Heme/Allergies:  Negative.   Psychiatric/Behavioral: The patient has insomnia.     Height 5\' 6"  (1.676 m), weight 115.7 kg (255 lb), last menstrual period 01/25/2017, unknown if currently breastfeeding. Physical Exam  Constitutional: She is oriented to person, place, and time. She appears well-developed and well-nourished.  HENT:  Head: Normocephalic and atraumatic.  Eyes: Pupils are equal, round, and reactive to light.  Neck: Normal range of motion. Neck supple.  Cardiovascular: Intact distal pulses.  Respiratory: Effort normal.  Musculoskeletal: She exhibits tenderness.  Range of motion is from 0-140, limited by adipose tissue.  Collateral ligaments are stable.  Neurovascularly intact distally.  Normal pulses.  She is somewhat tender along the paralumbar muscles, rotation aggravates her back pain.  One plus.  Neurovascular intact distally  Neurological: She is alert and oriented to person, place, and time.  Skin: Skin is warm and dry.  Psychiatric: She has a normal mood and affect. Her behavior is normal. Judgment and thought content normal.     Assessment/Plan 1.  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  Plan: Prescription for physical therapy twice a week for up to 4 weeks and follow up with Dr. Althea CharonMcKinley regarding the back.  Regarding her any, if it does become more of an issue, certainly if it bothers her interfering with sleep or work I'd be happy to see her back  and further discuss surgical intervention, but at this time is probably not indicated.  I have encouraged walking, swimming, cycling, elliptical machine and light weight training.  Maeli Spacek R, PA-C 02/10/2017, 8:25 AM

## 2017-02-11 ENCOUNTER — Ambulatory Visit (HOSPITAL_BASED_OUTPATIENT_CLINIC_OR_DEPARTMENT_OTHER): Payer: BLUE CROSS/BLUE SHIELD | Admitting: Anesthesiology

## 2017-02-11 ENCOUNTER — Encounter (HOSPITAL_BASED_OUTPATIENT_CLINIC_OR_DEPARTMENT_OTHER)
Admission: RE | Disposition: A | Discharge status: Home / Self Care | Payer: Self-pay | Source: Ambulatory Visit | Attending: Orthopedic Surgery

## 2017-02-11 ENCOUNTER — Encounter (HOSPITAL_BASED_OUTPATIENT_CLINIC_OR_DEPARTMENT_OTHER): Payer: Self-pay | Admitting: Anesthesiology

## 2017-02-11 ENCOUNTER — Other Ambulatory Visit: Payer: Self-pay

## 2017-02-11 ENCOUNTER — Ambulatory Visit (HOSPITAL_BASED_OUTPATIENT_CLINIC_OR_DEPARTMENT_OTHER)
Admission: RE | Admit: 2017-02-11 | Discharge: 2017-02-11 | Disposition: A | Discharge status: Home / Self Care | Payer: BLUE CROSS/BLUE SHIELD | Source: Ambulatory Visit | Attending: Orthopedic Surgery | Admitting: Orthopedic Surgery

## 2017-02-11 DIAGNOSIS — Z8249 Family history of ischemic heart disease and other diseases of the circulatory system: Secondary | ICD-10-CM | POA: Diagnosis not present

## 2017-02-11 DIAGNOSIS — Y9366 Activity, soccer: Secondary | ICD-10-CM | POA: Insufficient documentation

## 2017-02-11 DIAGNOSIS — M419 Scoliosis, unspecified: Secondary | ICD-10-CM | POA: Diagnosis not present

## 2017-02-11 DIAGNOSIS — S83241A Other tear of medial meniscus, current injury, right knee, initial encounter: Secondary | ICD-10-CM | POA: Diagnosis present

## 2017-02-11 DIAGNOSIS — S83231A Complex tear of medial meniscus, current injury, right knee, initial encounter: Secondary | ICD-10-CM | POA: Insufficient documentation

## 2017-02-11 DIAGNOSIS — M170 Bilateral primary osteoarthritis of knee: Secondary | ICD-10-CM | POA: Diagnosis not present

## 2017-02-11 DIAGNOSIS — M23321 Other meniscus derangements, posterior horn of medial meniscus, right knee: Secondary | ICD-10-CM | POA: Diagnosis not present

## 2017-02-11 DIAGNOSIS — J45909 Unspecified asthma, uncomplicated: Secondary | ICD-10-CM | POA: Insufficient documentation

## 2017-02-11 DIAGNOSIS — G4730 Sleep apnea, unspecified: Secondary | ICD-10-CM | POA: Insufficient documentation

## 2017-02-11 DIAGNOSIS — M5136 Other intervertebral disc degeneration, lumbar region: Secondary | ICD-10-CM | POA: Insufficient documentation

## 2017-02-11 DIAGNOSIS — Z6841 Body Mass Index (BMI) 40.0 and over, adult: Secondary | ICD-10-CM | POA: Diagnosis not present

## 2017-02-11 DIAGNOSIS — M2241 Chondromalacia patellae, right knee: Secondary | ICD-10-CM | POA: Diagnosis not present

## 2017-02-11 HISTORY — DX: Other tear of medial meniscus, current injury, unspecified knee, initial encounter: S83.249A

## 2017-02-11 HISTORY — PX: KNEE ARTHROSCOPY: SHX127

## 2017-02-11 HISTORY — DX: Unspecified osteoarthritis, unspecified site: M19.90

## 2017-02-11 SURGERY — ARTHROSCOPY, KNEE
Anesthesia: General | Site: Knee | Laterality: Right

## 2017-02-11 MED ORDER — CEFAZOLIN SODIUM-DEXTROSE 2-4 GM/100ML-% IV SOLN
2.0000 g | INTRAVENOUS | Status: AC
  Administered 2017-02-11: 2 g via INTRAVENOUS

## 2017-02-11 MED ORDER — CEFAZOLIN SODIUM-DEXTROSE 2-4 GM/100ML-% IV SOLN
INTRAVENOUS | Status: AC
  Filled 2017-02-11: qty 100

## 2017-02-11 MED ORDER — SCOPOLAMINE 1 MG/3DAYS TD PT72: 1.0000 | MEDICATED_PATCH | Freq: Once | TRANSDERMAL | Status: DC | PRN

## 2017-02-11 MED ORDER — BUPIVACAINE-EPINEPHRINE (PF) 0.25% -1:200000 IJ SOLN
INTRAMUSCULAR | Status: AC
Start: 2017-02-11 — End: 2017-02-11
  Filled 2017-02-11: qty 30

## 2017-02-11 MED ORDER — DEXAMETHASONE SODIUM PHOSPHATE 10 MG/ML IJ SOLN
INTRAMUSCULAR | Status: AC
  Filled 2017-02-11: qty 1

## 2017-02-11 MED ORDER — MIDAZOLAM HCL 2 MG/2ML IJ SOLN
INTRAMUSCULAR | Status: AC
  Filled 2017-02-11: qty 2

## 2017-02-11 MED ORDER — LACTATED RINGERS IV SOLN
INTRAVENOUS | Status: DC
  Administered 2017-02-11: 10:00:00 via INTRAVENOUS

## 2017-02-11 MED ORDER — KETOROLAC TROMETHAMINE 30 MG/ML IJ SOLN: 30.0000 mg | Freq: Once | INTRAMUSCULAR | Status: DC | PRN

## 2017-02-11 MED ORDER — HYDROCODONE-ACETAMINOPHEN 5-325 MG PO TABS: 1.0000 | ORAL_TABLET | ORAL | 0 refills | Status: DC | PRN

## 2017-02-11 MED ORDER — EPINEPHRINE 30 MG/30ML IJ SOLN
INTRAMUSCULAR | Status: AC
  Filled 2017-02-11: qty 1

## 2017-02-11 MED ORDER — ONDANSETRON HCL 4 MG/2ML IJ SOLN: 4.0000 mg | Freq: Once | INTRAMUSCULAR | Status: DC | PRN

## 2017-02-11 MED ORDER — BUPIVACAINE-EPINEPHRINE 0.5% -1:200000 IJ SOLN
INTRAMUSCULAR | Status: DC | PRN
  Administered 2017-02-11: 30 mL

## 2017-02-11 MED ORDER — ACETAMINOPHEN 160 MG/5ML PO SOLN: 325.0000 mg | ORAL | Status: DC | PRN

## 2017-02-11 MED ORDER — KETOROLAC TROMETHAMINE 30 MG/ML IJ SOLN
INTRAMUSCULAR | Status: DC | PRN
  Administered 2017-02-11: 30 mg via INTRAVENOUS

## 2017-02-11 MED ORDER — LIDOCAINE 2% (20 MG/ML) 5 ML SYRINGE
INTRAMUSCULAR | Status: AC
  Filled 2017-02-11: qty 5

## 2017-02-11 MED ORDER — OXYCODONE HCL 5 MG PO TABS: 5.0000 mg | ORAL_TABLET | Freq: Once | ORAL | Status: DC | PRN

## 2017-02-11 MED ORDER — ACETAMINOPHEN 325 MG PO TABS: 325.0000 mg | ORAL_TABLET | ORAL | Status: DC | PRN

## 2017-02-11 MED ORDER — CHLORHEXIDINE GLUCONATE 4 % EX LIQD: 60.0000 mL | Freq: Once | CUTANEOUS | Status: DC

## 2017-02-11 MED ORDER — BUPIVACAINE-EPINEPHRINE (PF) 0.5% -1:200000 IJ SOLN
INTRAMUSCULAR | Status: AC
  Filled 2017-02-11: qty 30

## 2017-02-11 MED ORDER — FENTANYL CITRATE (PF) 100 MCG/2ML IJ SOLN
INTRAMUSCULAR | Status: AC
  Filled 2017-02-11: qty 2

## 2017-02-11 MED ORDER — MIDAZOLAM HCL 2 MG/2ML IJ SOLN
1.0000 mg | INTRAMUSCULAR | Status: DC | PRN
  Administered 2017-02-11: 2 mg via INTRAVENOUS

## 2017-02-11 MED ORDER — LACTATED RINGERS IV SOLN: INTRAVENOUS | Status: DC

## 2017-02-11 MED ORDER — PROPOFOL 10 MG/ML IV BOLUS
INTRAVENOUS | Status: DC | PRN
  Administered 2017-02-11: 180 mg via INTRAVENOUS

## 2017-02-11 MED ORDER — MEPERIDINE HCL 25 MG/ML IJ SOLN: 6.2500 mg | INTRAMUSCULAR | Status: DC | PRN

## 2017-02-11 MED ORDER — KETOROLAC TROMETHAMINE 30 MG/ML IJ SOLN
INTRAMUSCULAR | Status: AC
  Filled 2017-02-11: qty 1

## 2017-02-11 MED ORDER — SODIUM CHLORIDE 0.9 % IR SOLN
Status: DC | PRN
  Administered 2017-02-11: 3000 mL

## 2017-02-11 MED ORDER — FENTANYL CITRATE (PF) 100 MCG/2ML IJ SOLN
50.0000 ug | INTRAMUSCULAR | Status: DC | PRN
  Administered 2017-02-11: 100 ug via INTRAVENOUS

## 2017-02-11 MED ORDER — BUPIVACAINE HCL (PF) 0.5 % IJ SOLN
INTRAMUSCULAR | Status: AC
  Filled 2017-02-11: qty 30

## 2017-02-11 MED ORDER — ONDANSETRON HCL 4 MG/2ML IJ SOLN
INTRAMUSCULAR | Status: AC
  Filled 2017-02-11: qty 2

## 2017-02-11 MED ORDER — DEXAMETHASONE SODIUM PHOSPHATE 4 MG/ML IJ SOLN
INTRAMUSCULAR | Status: DC | PRN
  Administered 2017-02-11: 10 mg via INTRAVENOUS

## 2017-02-11 MED ORDER — PROPOFOL 500 MG/50ML IV EMUL
INTRAVENOUS | Status: AC
  Filled 2017-02-11: qty 50

## 2017-02-11 MED ORDER — FENTANYL CITRATE (PF) 100 MCG/2ML IJ SOLN: 25.0000 ug | INTRAMUSCULAR | Status: DC | PRN

## 2017-02-11 MED ORDER — ONDANSETRON HCL 4 MG/2ML IJ SOLN
INTRAMUSCULAR | Status: DC | PRN
  Administered 2017-02-11: 4 mg via INTRAVENOUS

## 2017-02-11 MED ORDER — OXYCODONE HCL 5 MG/5ML PO SOLN: 5.0000 mg | Freq: Once | ORAL | Status: DC | PRN

## 2017-02-11 SURGICAL SUPPLY — 42 items
BANDAGE ACE 6X5 VEL STRL LF (GAUZE/BANDAGES/DRESSINGS) ×2 IMPLANT
BLADE 4.2CUDA (BLADE) IMPLANT
BLADE CUTTER GATOR 3.5 (BLADE) ×2 IMPLANT
BLADE GREAT WHITE 4.2 (BLADE) IMPLANT
BNDG COHESIVE 6X5 TAN STRL LF (GAUZE/BANDAGES/DRESSINGS) ×2 IMPLANT
DRAPE ARTHROSCOPY W/POUCH 114 (DRAPES) ×2 IMPLANT
DURAPREP 26ML APPLICATOR (WOUND CARE) ×2 IMPLANT
ELECT MENISCUS 165MM 90D (ELECTRODE) IMPLANT
ELECT REM PT RETURN 9FT ADLT (ELECTROSURGICAL)
ELECTRODE REM PT RTRN 9FT ADLT (ELECTROSURGICAL) IMPLANT
GAUZE SPONGE 4X4 12PLY STRL (GAUZE/BANDAGES/DRESSINGS) ×2 IMPLANT
GAUZE XEROFORM 1X8 LF (GAUZE/BANDAGES/DRESSINGS) ×2 IMPLANT
GLOVE BIO SURGEON STRL SZ7.5 (GLOVE) ×2 IMPLANT
GLOVE BIO SURGEON STRL SZ8.5 (GLOVE) ×2 IMPLANT
GLOVE BIOGEL PI IND STRL 7.0 (GLOVE) ×1 IMPLANT
GLOVE BIOGEL PI IND STRL 8 (GLOVE) ×1 IMPLANT
GLOVE BIOGEL PI IND STRL 9 (GLOVE) ×1 IMPLANT
GLOVE BIOGEL PI INDICATOR 7.0 (GLOVE) ×1
GLOVE BIOGEL PI INDICATOR 8 (GLOVE) ×1
GLOVE BIOGEL PI INDICATOR 9 (GLOVE) ×1
GLOVE ECLIPSE 6.5 STRL STRAW (GLOVE) ×2 IMPLANT
GLOVE EXAM NITRILE MD LF STRL (GLOVE) ×2 IMPLANT
GOWN STRL REUS W/ TWL LRG LVL3 (GOWN DISPOSABLE) ×1 IMPLANT
GOWN STRL REUS W/TWL LRG LVL3 (GOWN DISPOSABLE) ×1
GOWN STRL REUS W/TWL XL LVL3 (GOWN DISPOSABLE) ×4 IMPLANT
IV NS IRRIG 3000ML ARTHROMATIC (IV SOLUTION) ×2 IMPLANT
KNEE WRAP E Z 3 GEL PACK (MISCELLANEOUS) ×2 IMPLANT
MANIFOLD NEPTUNE II (INSTRUMENTS) IMPLANT
NDL SAFETY ECLIPSE 18X1.5 (NEEDLE) ×1 IMPLANT
NEEDLE HYPO 18GX1.5 SHARP (NEEDLE) ×1
PACK ARTHROSCOPY DSU (CUSTOM PROCEDURE TRAY) ×2 IMPLANT
PACK BASIN DAY SURGERY FS (CUSTOM PROCEDURE TRAY) ×2 IMPLANT
PAD ALCOHOL SWAB (MISCELLANEOUS) ×2 IMPLANT
PENCIL BUTTON HOLSTER BLD 10FT (ELECTRODE) IMPLANT
PROBE BIPOLAR ATHRO 135MM 90D (MISCELLANEOUS) IMPLANT
SET ARTHROSCOPY TUBING (MISCELLANEOUS) ×1
SET ARTHROSCOPY TUBING LN (MISCELLANEOUS) ×1 IMPLANT
SLEEVE SCD COMPRESS KNEE MED (MISCELLANEOUS) ×2 IMPLANT
SYR 3ML 18GX1 1/2 (SYRINGE) IMPLANT
SYR 5ML LL (SYRINGE) ×2 IMPLANT
TOWEL OR 17X24 6PK STRL BLUE (TOWEL DISPOSABLE) ×2 IMPLANT
WATER STERILE IRR 1000ML POUR (IV SOLUTION) ×2 IMPLANT

## 2017-02-11 NOTE — Transfer of Care (Signed)
Immediate Anesthesia Transfer of Care Note  Patient: Katie GottronKathryn Glass  Procedure(s) Performed: RIGHT KNEE ARTHROSCOPY WITH DEBRIDEMENT CHONDROMALACIA (Right Knee)  Patient Location: PACU  Anesthesia Type:General  Level of Consciousness: awake, oriented and patient cooperative  Airway & Oxygen Therapy: Patient Spontanous Breathing and Patient connected to face mask oxygen  Post-op Assessment: Report given to RN and Post -op Vital signs reviewed and stable  Post vital signs: Reviewed and stable  Last Vitals:  Vitals:   02/11/17 1000  BP: 138/80  Pulse: 80  Resp: 18  Temp: 37 C  SpO2: 97%    Last Pain:  Vitals:   02/11/17 1000  TempSrc: Oral      Patients Stated Pain Goal: 0 (02/11/17 1000)  Complications: No apparent anesthesia complications

## 2017-02-11 NOTE — Discharge Instructions (Signed)

## 2017-02-11 NOTE — Interval H&P Note (Signed)
History and Physical Interval Note:  02/11/2017 11:38 AM  Katie GottronKathryn Glass  has presented today for surgery, with the diagnosis of RIGHT KNEE MEDIAL MENISCAL TEAR  The various methods of treatment have been discussed with the patient and family. After consideration of risks, benefits and other options for treatment, the patient has consented to  Procedure(s): ARTHROSCOPY RIGHT KNEE (Right) as a surgical intervention .  The patient's history has been reviewed, patient examined, no change in status, stable for surgery.  I have reviewed the patient's chart and labs.  Questions were answered to the patient's satisfaction.     Nestor LewandowskyFrank J Lequisha Cammack

## 2017-02-11 NOTE — Anesthesia Procedure Notes (Signed)
Procedure Name: LMA Insertion Date/Time: 02/11/2017 11:54 AM Performed by: Gar GibbonKeeton, Karan Ramnauth S, CRNA Pre-anesthesia Checklist: Patient identified, Emergency Drugs available, Suction available and Patient being monitored Patient Re-evaluated:Patient Re-evaluated prior to induction Oxygen Delivery Method: Circle system utilized Preoxygenation: Pre-oxygenation with 100% oxygen Induction Type: IV induction Ventilation: Mask ventilation without difficulty LMA: LMA inserted LMA Size: 4.0 Number of attempts: 1 Airway Equipment and Method: Bite block Placement Confirmation: positive ETCO2 Tube secured with: Tape Dental Injury: Teeth and Oropharynx as per pre-operative assessment

## 2017-02-11 NOTE — Anesthesia Preprocedure Evaluation (Signed)
Anesthesia Evaluation  Patient identified by MRN, date of birth, ID band Patient awake    Reviewed: Allergy & Precautions, NPO status , Patient's Chart, lab work & pertinent test results  Airway Mallampati: II       Dental no notable dental hx. (+) Teeth Intact   Pulmonary    Pulmonary exam normal breath sounds clear to auscultation       Cardiovascular negative cardio ROS Normal cardiovascular exam Rhythm:Regular Rate:Normal     Neuro/Psych negative neurological ROS  negative psych ROS   GI/Hepatic negative GI ROS, Neg liver ROS,   Endo/Other  Morbid obesity  Renal/GU negative Renal ROS  negative genitourinary   Musculoskeletal   Abdominal (+) + obese,   Peds  Hematology   Anesthesia Other Findings   Reproductive/Obstetrics                             Anesthesia Physical Anesthesia Plan  ASA: III  Anesthesia Plan: General   Post-op Pain Management:    Induction:   PONV Risk Score and Plan: 2 and Ondansetron and Dexamethasone  Airway Management Planned: LMA  Additional Equipment:   Intra-op Plan:   Post-operative Plan:   Informed Consent: I have reviewed the patients History and Physical, chart, labs and discussed the procedure including the risks, benefits and alternatives for the proposed anesthesia with the patient or authorized representative who has indicated his/her understanding and acceptance.   Dental advisory given  Plan Discussed with: CRNA and Surgeon  Anesthesia Plan Comments:         Anesthesia Quick Evaluation

## 2017-02-11 NOTE — Op Note (Signed)
Pre-Op Dx: right knee medial meniscal tear with chondromalacia  Postop Dx: Right knee medial meniscal tear posterior horn. Chondromalacia of trochlea  Procedure: Right knee partial arthroscopic medial meniscectomy, debridement chondromalacia trochlea   Surgeon: Feliberto GottronFrank J. Turner Danielsowan M.D.  Assist: Tomi LikensEric K. Gaylene BrooksPhillips PA-C  (present throughout entire procedure and necessary for timely completion of the procedure) Anes: General LMA  EBL: Minimal  Fluids: 800 cc   Indications: Catching popping and pain in the right knee. Pt has failed conservative treatment with anti-inflammatory medicines, physical therapy, and modified activites but did get good temporarily from an intra-articular cortisone injection. Pain has recurred and patient desires elective arthroscopic evaluation and treatment of knee. Risks and benefits of surgery have been discussed and questions answered.  Procedure: Patient identified by arm band and taken to the operating room at the day surgery Center. The appropriate anesthetic monitors were attached, and General LMA anesthesia was induced without difficulty. Lateral post was applied to the table and the lower extremity was prepped and draped in usual sterile fashion from the ankle to the midthigh. Time out procedure was performed. We began the operation by making standard inferior lateral and inferior medial peripatellar portals with a #11 blade allowing introduction of the arthroscope through the inferior lateral portal and the out flow to the inferior medial portal. Pump pressure was set at 100 mmHg and diagnostic arthroscopy  revealed grade II chondromalacia to grade III chondromalacia apex of the patella that was debrided back to a stable margin with a 3.5 mm Gator resector shaver.  Likewise were flap tears in the trochlea grade 3 again debrided with a sucker shaver.  Moving into the medial compartment we identified a medial meniscal tear posterior horn parrot-beak and horizontal cleavage  again debrided back to a stable margin with a straight small bladder and a 3 5 Gator resector shaver.  The ACL and PCL are intact.  Lateral compartment was in excellent condition.  The gutters were cleared medially and laterally.. The knee was irrigated out normal saline solution. A dressing of xerofoam 4 x 4 dressing sponges, web roll and an Ace wrap was applied. The patient was awakened extubated and taken to the recovery without difficulty.    Signed: Nestor LewandowskyFrank J Lexton Hidalgo, MD

## 2017-02-11 NOTE — Anesthesia Postprocedure Evaluation (Signed)
Anesthesia Post Note  Patient: Katie GottronKathryn Lanigan  Procedure(s) Performed: RIGHT KNEE ARTHROSCOPY WITH DEBRIDEMENT CHONDROMALACIA (Right Knee)     Patient location during evaluation: PACU Anesthesia Type: General Level of consciousness: awake Pain management: pain level controlled Vital Signs Assessment: post-procedure vital signs reviewed and stable Respiratory status: spontaneous breathing Cardiovascular status: stable Postop Assessment: no apparent nausea or vomiting Anesthetic complications: no    Last Vitals:  Vitals:   02/11/17 1245 02/11/17 1300  BP: 131/83 140/82  Pulse: 80 83  Resp: 15 19  Temp:    SpO2: 100% 98%    Last Pain:  Vitals:   02/11/17 1300  TempSrc:   PainSc: 0-No pain   Pain Goal: Patients Stated Pain Goal: 0 (02/11/17 1000)      RLE Motor Response: Purposeful movement (02/11/17 1300) RLE Sensation: Full sensation (02/11/17 1300)      Vestal Crandall JR,JOHN Cheyrl Buley

## 2017-02-16 ENCOUNTER — Encounter (HOSPITAL_BASED_OUTPATIENT_CLINIC_OR_DEPARTMENT_OTHER): Payer: Self-pay | Admitting: Orthopedic Surgery

## 2017-02-18 DIAGNOSIS — M25661 Stiffness of right knee, not elsewhere classified: Secondary | ICD-10-CM | POA: Diagnosis not present

## 2017-02-18 DIAGNOSIS — S83242A Other tear of medial meniscus, current injury, left knee, initial encounter: Secondary | ICD-10-CM | POA: Diagnosis not present

## 2017-02-18 DIAGNOSIS — M25561 Pain in right knee: Secondary | ICD-10-CM | POA: Diagnosis not present

## 2017-02-18 DIAGNOSIS — M23207 Derangement of unspecified meniscus due to old tear or injury, left knee: Secondary | ICD-10-CM | POA: Diagnosis not present

## 2017-02-23 DIAGNOSIS — M25661 Stiffness of right knee, not elsewhere classified: Secondary | ICD-10-CM | POA: Diagnosis not present

## 2017-02-23 DIAGNOSIS — M25562 Pain in left knee: Secondary | ICD-10-CM | POA: Diagnosis not present

## 2017-02-23 DIAGNOSIS — M25561 Pain in right knee: Secondary | ICD-10-CM | POA: Diagnosis not present

## 2017-02-26 DIAGNOSIS — M25661 Stiffness of right knee, not elsewhere classified: Secondary | ICD-10-CM | POA: Diagnosis not present

## 2017-02-26 DIAGNOSIS — M25561 Pain in right knee: Secondary | ICD-10-CM | POA: Diagnosis not present

## 2017-03-03 DIAGNOSIS — Z9889 Other specified postprocedural states: Secondary | ICD-10-CM | POA: Diagnosis not present

## 2017-03-04 DIAGNOSIS — M25561 Pain in right knee: Secondary | ICD-10-CM | POA: Diagnosis not present

## 2017-03-04 DIAGNOSIS — M25661 Stiffness of right knee, not elsewhere classified: Secondary | ICD-10-CM | POA: Diagnosis not present

## 2017-03-05 DIAGNOSIS — S83242A Other tear of medial meniscus, current injury, left knee, initial encounter: Secondary | ICD-10-CM | POA: Insufficient documentation

## 2017-03-09 DIAGNOSIS — M25661 Stiffness of right knee, not elsewhere classified: Secondary | ICD-10-CM | POA: Diagnosis not present

## 2017-03-09 DIAGNOSIS — M25561 Pain in right knee: Secondary | ICD-10-CM | POA: Diagnosis not present

## 2017-03-11 DIAGNOSIS — S83242A Other tear of medial meniscus, current injury, left knee, initial encounter: Secondary | ICD-10-CM | POA: Diagnosis not present

## 2017-03-11 DIAGNOSIS — M23322 Other meniscus derangements, posterior horn of medial meniscus, left knee: Secondary | ICD-10-CM | POA: Diagnosis not present

## 2017-03-11 DIAGNOSIS — M23262 Derangement of other lateral meniscus due to old tear or injury, left knee: Secondary | ICD-10-CM | POA: Diagnosis not present

## 2017-03-11 DIAGNOSIS — Y999 Unspecified external cause status: Secondary | ICD-10-CM | POA: Diagnosis not present

## 2017-03-11 DIAGNOSIS — X58XXXA Exposure to other specified factors, initial encounter: Secondary | ICD-10-CM | POA: Diagnosis not present

## 2017-03-11 DIAGNOSIS — G8918 Other acute postprocedural pain: Secondary | ICD-10-CM | POA: Diagnosis not present

## 2017-03-11 DIAGNOSIS — M2242 Chondromalacia patellae, left knee: Secondary | ICD-10-CM | POA: Diagnosis not present

## 2017-03-23 DIAGNOSIS — M25662 Stiffness of left knee, not elsewhere classified: Secondary | ICD-10-CM | POA: Diagnosis not present

## 2017-03-23 DIAGNOSIS — M23232 Derangement of other medial meniscus due to old tear or injury, left knee: Secondary | ICD-10-CM | POA: Diagnosis not present

## 2017-03-23 DIAGNOSIS — M2242 Chondromalacia patellae, left knee: Secondary | ICD-10-CM | POA: Diagnosis not present

## 2017-03-23 DIAGNOSIS — M25562 Pain in left knee: Secondary | ICD-10-CM | POA: Diagnosis not present

## 2017-03-25 DIAGNOSIS — H6981 Other specified disorders of Eustachian tube, right ear: Secondary | ICD-10-CM | POA: Diagnosis not present

## 2017-03-26 DIAGNOSIS — M2242 Chondromalacia patellae, left knee: Secondary | ICD-10-CM | POA: Diagnosis not present

## 2017-03-26 DIAGNOSIS — M23232 Derangement of other medial meniscus due to old tear or injury, left knee: Secondary | ICD-10-CM | POA: Diagnosis not present

## 2017-03-26 DIAGNOSIS — M25562 Pain in left knee: Secondary | ICD-10-CM | POA: Diagnosis not present

## 2017-03-26 DIAGNOSIS — M25662 Stiffness of left knee, not elsewhere classified: Secondary | ICD-10-CM | POA: Diagnosis not present

## 2017-03-30 DIAGNOSIS — M23232 Derangement of other medial meniscus due to old tear or injury, left knee: Secondary | ICD-10-CM | POA: Diagnosis not present

## 2017-03-30 DIAGNOSIS — M25662 Stiffness of left knee, not elsewhere classified: Secondary | ICD-10-CM | POA: Diagnosis not present

## 2017-03-30 DIAGNOSIS — M2242 Chondromalacia patellae, left knee: Secondary | ICD-10-CM | POA: Diagnosis not present

## 2017-03-30 DIAGNOSIS — M25562 Pain in left knee: Secondary | ICD-10-CM | POA: Diagnosis not present

## 2017-04-02 DIAGNOSIS — M23232 Derangement of other medial meniscus due to old tear or injury, left knee: Secondary | ICD-10-CM | POA: Diagnosis not present

## 2017-04-02 DIAGNOSIS — M25662 Stiffness of left knee, not elsewhere classified: Secondary | ICD-10-CM | POA: Diagnosis not present

## 2017-04-02 DIAGNOSIS — M2242 Chondromalacia patellae, left knee: Secondary | ICD-10-CM | POA: Diagnosis not present

## 2017-04-02 DIAGNOSIS — M25562 Pain in left knee: Secondary | ICD-10-CM | POA: Diagnosis not present

## 2017-04-06 DIAGNOSIS — M23232 Derangement of other medial meniscus due to old tear or injury, left knee: Secondary | ICD-10-CM | POA: Diagnosis not present

## 2017-04-06 DIAGNOSIS — M2242 Chondromalacia patellae, left knee: Secondary | ICD-10-CM | POA: Diagnosis not present

## 2017-04-06 DIAGNOSIS — M25562 Pain in left knee: Secondary | ICD-10-CM | POA: Diagnosis not present

## 2017-04-06 DIAGNOSIS — M25662 Stiffness of left knee, not elsewhere classified: Secondary | ICD-10-CM | POA: Diagnosis not present

## 2017-04-09 DIAGNOSIS — M2242 Chondromalacia patellae, left knee: Secondary | ICD-10-CM | POA: Diagnosis not present

## 2017-04-09 DIAGNOSIS — M25662 Stiffness of left knee, not elsewhere classified: Secondary | ICD-10-CM | POA: Diagnosis not present

## 2017-04-09 DIAGNOSIS — M23232 Derangement of other medial meniscus due to old tear or injury, left knee: Secondary | ICD-10-CM | POA: Diagnosis not present

## 2017-04-09 DIAGNOSIS — M25562 Pain in left knee: Secondary | ICD-10-CM | POA: Diagnosis not present

## 2017-04-13 DIAGNOSIS — M23232 Derangement of other medial meniscus due to old tear or injury, left knee: Secondary | ICD-10-CM | POA: Diagnosis not present

## 2017-04-13 DIAGNOSIS — M2242 Chondromalacia patellae, left knee: Secondary | ICD-10-CM | POA: Diagnosis not present

## 2017-04-13 DIAGNOSIS — M25562 Pain in left knee: Secondary | ICD-10-CM | POA: Diagnosis not present

## 2017-04-13 DIAGNOSIS — M25662 Stiffness of left knee, not elsewhere classified: Secondary | ICD-10-CM | POA: Diagnosis not present

## 2017-04-16 DIAGNOSIS — M23232 Derangement of other medial meniscus due to old tear or injury, left knee: Secondary | ICD-10-CM | POA: Diagnosis not present

## 2017-04-16 DIAGNOSIS — M2242 Chondromalacia patellae, left knee: Secondary | ICD-10-CM | POA: Diagnosis not present

## 2017-04-16 DIAGNOSIS — M25562 Pain in left knee: Secondary | ICD-10-CM | POA: Diagnosis not present

## 2017-04-16 DIAGNOSIS — M25662 Stiffness of left knee, not elsewhere classified: Secondary | ICD-10-CM | POA: Diagnosis not present

## 2017-04-20 DIAGNOSIS — M25562 Pain in left knee: Secondary | ICD-10-CM | POA: Diagnosis not present

## 2017-04-20 DIAGNOSIS — M2242 Chondromalacia patellae, left knee: Secondary | ICD-10-CM | POA: Diagnosis not present

## 2017-04-20 DIAGNOSIS — M23232 Derangement of other medial meniscus due to old tear or injury, left knee: Secondary | ICD-10-CM | POA: Diagnosis not present

## 2017-04-20 DIAGNOSIS — M25662 Stiffness of left knee, not elsewhere classified: Secondary | ICD-10-CM | POA: Diagnosis not present

## 2017-05-19 DIAGNOSIS — Z124 Encounter for screening for malignant neoplasm of cervix: Secondary | ICD-10-CM | POA: Diagnosis not present

## 2017-05-19 DIAGNOSIS — Z01419 Encounter for gynecological examination (general) (routine) without abnormal findings: Secondary | ICD-10-CM | POA: Diagnosis not present

## 2017-05-19 DIAGNOSIS — Z1231 Encounter for screening mammogram for malignant neoplasm of breast: Secondary | ICD-10-CM | POA: Diagnosis not present

## 2017-05-19 DIAGNOSIS — Z6841 Body Mass Index (BMI) 40.0 and over, adult: Secondary | ICD-10-CM | POA: Diagnosis not present

## 2017-09-15 DIAGNOSIS — Z6841 Body Mass Index (BMI) 40.0 and over, adult: Secondary | ICD-10-CM | POA: Diagnosis not present

## 2017-09-15 DIAGNOSIS — Z3201 Encounter for pregnancy test, result positive: Secondary | ICD-10-CM | POA: Diagnosis not present

## 2017-09-15 DIAGNOSIS — N925 Other specified irregular menstruation: Secondary | ICD-10-CM | POA: Diagnosis not present

## 2017-09-15 DIAGNOSIS — Z113 Encounter for screening for infections with a predominantly sexual mode of transmission: Secondary | ICD-10-CM | POA: Diagnosis not present

## 2017-09-15 DIAGNOSIS — Z3480 Encounter for supervision of other normal pregnancy, unspecified trimester: Secondary | ICD-10-CM | POA: Diagnosis not present

## 2017-09-30 DIAGNOSIS — Z3480 Encounter for supervision of other normal pregnancy, unspecified trimester: Secondary | ICD-10-CM | POA: Diagnosis not present

## 2017-09-30 DIAGNOSIS — Z369 Encounter for antenatal screening, unspecified: Secondary | ICD-10-CM | POA: Diagnosis not present

## 2017-09-30 DIAGNOSIS — Z3481 Encounter for supervision of other normal pregnancy, first trimester: Secondary | ICD-10-CM | POA: Diagnosis not present

## 2017-09-30 LAB — OB RESULTS CONSOLE RPR: RPR: NONREACTIVE

## 2017-09-30 LAB — OB RESULTS CONSOLE GC/CHLAMYDIA
Chlamydia: NEGATIVE
Gonorrhea: NEGATIVE

## 2017-09-30 LAB — OB RESULTS CONSOLE HIV ANTIBODY (ROUTINE TESTING): HIV: NONREACTIVE

## 2017-09-30 LAB — OB RESULTS CONSOLE HEPATITIS B SURFACE ANTIGEN: HEP B S AG: NEGATIVE

## 2017-09-30 LAB — OB RESULTS CONSOLE ABO/RH: RH TYPE: NEGATIVE

## 2017-09-30 LAB — OB RESULTS CONSOLE RUBELLA ANTIBODY, IGM: Rubella: IMMUNE

## 2017-09-30 LAB — OB RESULTS CONSOLE ANTIBODY SCREEN: Antibody Screen: NEGATIVE

## 2017-10-08 DIAGNOSIS — G4733 Obstructive sleep apnea (adult) (pediatric): Secondary | ICD-10-CM | POA: Diagnosis not present

## 2017-10-12 DIAGNOSIS — O09521 Supervision of elderly multigravida, first trimester: Secondary | ICD-10-CM | POA: Diagnosis not present

## 2017-10-27 DIAGNOSIS — Z369 Encounter for antenatal screening, unspecified: Secondary | ICD-10-CM | POA: Diagnosis not present

## 2017-10-27 DIAGNOSIS — Z3482 Encounter for supervision of other normal pregnancy, second trimester: Secondary | ICD-10-CM | POA: Diagnosis not present

## 2017-11-04 DIAGNOSIS — G4733 Obstructive sleep apnea (adult) (pediatric): Secondary | ICD-10-CM | POA: Diagnosis not present

## 2017-11-09 DIAGNOSIS — M25561 Pain in right knee: Secondary | ICD-10-CM | POA: Diagnosis not present

## 2017-11-12 DIAGNOSIS — M7631 Iliotibial band syndrome, right leg: Secondary | ICD-10-CM | POA: Diagnosis not present

## 2017-11-12 DIAGNOSIS — M25661 Stiffness of right knee, not elsewhere classified: Secondary | ICD-10-CM | POA: Diagnosis not present

## 2017-11-13 DIAGNOSIS — M7631 Iliotibial band syndrome, right leg: Secondary | ICD-10-CM | POA: Diagnosis not present

## 2017-11-13 DIAGNOSIS — M25661 Stiffness of right knee, not elsewhere classified: Secondary | ICD-10-CM | POA: Diagnosis not present

## 2017-11-16 DIAGNOSIS — G4733 Obstructive sleep apnea (adult) (pediatric): Secondary | ICD-10-CM | POA: Diagnosis not present

## 2017-11-16 DIAGNOSIS — M25661 Stiffness of right knee, not elsewhere classified: Secondary | ICD-10-CM | POA: Diagnosis not present

## 2017-11-16 DIAGNOSIS — M7631 Iliotibial band syndrome, right leg: Secondary | ICD-10-CM | POA: Diagnosis not present

## 2017-11-19 DIAGNOSIS — M7631 Iliotibial band syndrome, right leg: Secondary | ICD-10-CM | POA: Diagnosis not present

## 2017-11-19 DIAGNOSIS — M25661 Stiffness of right knee, not elsewhere classified: Secondary | ICD-10-CM | POA: Diagnosis not present

## 2017-11-25 DIAGNOSIS — M25661 Stiffness of right knee, not elsewhere classified: Secondary | ICD-10-CM | POA: Diagnosis not present

## 2017-11-25 DIAGNOSIS — M7631 Iliotibial band syndrome, right leg: Secondary | ICD-10-CM | POA: Diagnosis not present

## 2017-11-27 DIAGNOSIS — M7631 Iliotibial band syndrome, right leg: Secondary | ICD-10-CM | POA: Diagnosis not present

## 2017-11-27 DIAGNOSIS — M25661 Stiffness of right knee, not elsewhere classified: Secondary | ICD-10-CM | POA: Diagnosis not present

## 2017-11-27 DIAGNOSIS — O09899 Supervision of other high risk pregnancies, unspecified trimester: Secondary | ICD-10-CM | POA: Diagnosis not present

## 2017-12-02 DIAGNOSIS — M25661 Stiffness of right knee, not elsewhere classified: Secondary | ICD-10-CM | POA: Diagnosis not present

## 2017-12-02 DIAGNOSIS — M7631 Iliotibial band syndrome, right leg: Secondary | ICD-10-CM | POA: Diagnosis not present

## 2017-12-07 DIAGNOSIS — M7631 Iliotibial band syndrome, right leg: Secondary | ICD-10-CM | POA: Diagnosis not present

## 2017-12-07 DIAGNOSIS — M25661 Stiffness of right knee, not elsewhere classified: Secondary | ICD-10-CM | POA: Diagnosis not present

## 2017-12-10 DIAGNOSIS — M7631 Iliotibial band syndrome, right leg: Secondary | ICD-10-CM | POA: Diagnosis not present

## 2017-12-10 DIAGNOSIS — M25661 Stiffness of right knee, not elsewhere classified: Secondary | ICD-10-CM | POA: Diagnosis not present

## 2017-12-17 DIAGNOSIS — G4733 Obstructive sleep apnea (adult) (pediatric): Secondary | ICD-10-CM | POA: Diagnosis not present

## 2017-12-25 DIAGNOSIS — Z369 Encounter for antenatal screening, unspecified: Secondary | ICD-10-CM | POA: Diagnosis not present

## 2018-01-14 DIAGNOSIS — Z23 Encounter for immunization: Secondary | ICD-10-CM | POA: Diagnosis not present

## 2018-01-16 DIAGNOSIS — G4733 Obstructive sleep apnea (adult) (pediatric): Secondary | ICD-10-CM | POA: Diagnosis not present

## 2018-01-19 DIAGNOSIS — Z23 Encounter for immunization: Secondary | ICD-10-CM | POA: Diagnosis not present

## 2018-01-19 DIAGNOSIS — O360990 Maternal care for other rhesus isoimmunization, unspecified trimester, not applicable or unspecified: Secondary | ICD-10-CM | POA: Diagnosis not present

## 2018-01-19 DIAGNOSIS — Z3480 Encounter for supervision of other normal pregnancy, unspecified trimester: Secondary | ICD-10-CM | POA: Diagnosis not present

## 2018-02-05 DIAGNOSIS — Z369 Encounter for antenatal screening, unspecified: Secondary | ICD-10-CM | POA: Diagnosis not present

## 2018-02-12 ENCOUNTER — Other Ambulatory Visit: Payer: Self-pay | Admitting: Obstetrics and Gynecology

## 2018-02-16 DIAGNOSIS — Z369 Encounter for antenatal screening, unspecified: Secondary | ICD-10-CM | POA: Diagnosis not present

## 2018-02-16 DIAGNOSIS — G4733 Obstructive sleep apnea (adult) (pediatric): Secondary | ICD-10-CM | POA: Diagnosis not present

## 2018-02-22 DIAGNOSIS — D225 Melanocytic nevi of trunk: Secondary | ICD-10-CM | POA: Diagnosis not present

## 2018-02-22 DIAGNOSIS — L743 Miliaria, unspecified: Secondary | ICD-10-CM | POA: Diagnosis not present

## 2018-02-22 DIAGNOSIS — D485 Neoplasm of uncertain behavior of skin: Secondary | ICD-10-CM | POA: Diagnosis not present

## 2018-02-22 DIAGNOSIS — L821 Other seborrheic keratosis: Secondary | ICD-10-CM | POA: Diagnosis not present

## 2018-02-22 DIAGNOSIS — G4733 Obstructive sleep apnea (adult) (pediatric): Secondary | ICD-10-CM | POA: Diagnosis not present

## 2018-02-23 DIAGNOSIS — G4733 Obstructive sleep apnea (adult) (pediatric): Secondary | ICD-10-CM | POA: Diagnosis not present

## 2018-03-01 DIAGNOSIS — O09899 Supervision of other high risk pregnancies, unspecified trimester: Secondary | ICD-10-CM | POA: Diagnosis not present

## 2018-03-18 DIAGNOSIS — G4733 Obstructive sleep apnea (adult) (pediatric): Secondary | ICD-10-CM | POA: Diagnosis not present

## 2018-03-18 DIAGNOSIS — Z369 Encounter for antenatal screening, unspecified: Secondary | ICD-10-CM | POA: Diagnosis not present

## 2018-03-25 DIAGNOSIS — Z3480 Encounter for supervision of other normal pregnancy, unspecified trimester: Secondary | ICD-10-CM | POA: Diagnosis not present

## 2018-03-25 DIAGNOSIS — O09523 Supervision of elderly multigravida, third trimester: Secondary | ICD-10-CM | POA: Diagnosis not present

## 2018-03-25 DIAGNOSIS — Z369 Encounter for antenatal screening, unspecified: Secondary | ICD-10-CM | POA: Diagnosis not present

## 2018-03-25 LAB — OB RESULTS CONSOLE GBS: STREP GROUP B AG: POSITIVE

## 2018-04-02 ENCOUNTER — Telehealth (HOSPITAL_COMMUNITY): Payer: Self-pay | Admitting: *Deleted

## 2018-04-02 ENCOUNTER — Encounter (HOSPITAL_COMMUNITY): Payer: Self-pay | Admitting: *Deleted

## 2018-04-02 DIAGNOSIS — O09523 Supervision of elderly multigravida, third trimester: Secondary | ICD-10-CM | POA: Diagnosis not present

## 2018-04-02 DIAGNOSIS — O99213 Obesity complicating pregnancy, third trimester: Secondary | ICD-10-CM | POA: Diagnosis not present

## 2018-04-02 NOTE — Telephone Encounter (Signed)
Preadmission screen  

## 2018-04-05 ENCOUNTER — Telehealth (HOSPITAL_COMMUNITY): Payer: Self-pay | Admitting: *Deleted

## 2018-04-05 NOTE — Telephone Encounter (Signed)
Preadmission screen  

## 2018-04-06 ENCOUNTER — Encounter (HOSPITAL_COMMUNITY): Payer: Self-pay

## 2018-04-06 ENCOUNTER — Telehealth (HOSPITAL_COMMUNITY): Payer: Self-pay | Admitting: *Deleted

## 2018-04-06 NOTE — Telephone Encounter (Signed)
Preadmission screen  

## 2018-04-09 DIAGNOSIS — O09893 Supervision of other high risk pregnancies, third trimester: Secondary | ICD-10-CM | POA: Diagnosis not present

## 2018-04-09 DIAGNOSIS — O99213 Obesity complicating pregnancy, third trimester: Secondary | ICD-10-CM | POA: Diagnosis not present

## 2018-04-14 ENCOUNTER — Other Ambulatory Visit: Payer: Self-pay | Admitting: Obstetrics and Gynecology

## 2018-04-15 ENCOUNTER — Other Ambulatory Visit: Payer: Self-pay

## 2018-04-15 ENCOUNTER — Inpatient Hospital Stay (HOSPITAL_COMMUNITY): Payer: BLUE CROSS/BLUE SHIELD | Admitting: Anesthesiology

## 2018-04-15 ENCOUNTER — Inpatient Hospital Stay (HOSPITAL_COMMUNITY)
Admission: AD | Admit: 2018-04-15 | Discharge: 2018-04-17 | DRG: 785 | Disposition: A | Discharge status: Home / Self Care | Payer: BLUE CROSS/BLUE SHIELD | Attending: Obstetrics and Gynecology | Admitting: Obstetrics and Gynecology

## 2018-04-15 ENCOUNTER — Inpatient Hospital Stay (HOSPITAL_COMMUNITY)
Admission: RE | Admit: 2018-04-15 | Payer: BLUE CROSS/BLUE SHIELD | Source: Ambulatory Visit | Admitting: Obstetrics and Gynecology

## 2018-04-15 ENCOUNTER — Encounter (HOSPITAL_COMMUNITY)
Admission: RE | Admit: 2018-04-15 | Discharge: 2018-04-15 | Disposition: A | Discharge status: Home / Self Care | Payer: BLUE CROSS/BLUE SHIELD | Source: Ambulatory Visit | Attending: Obstetrics and Gynecology | Admitting: Obstetrics and Gynecology

## 2018-04-15 ENCOUNTER — Encounter (HOSPITAL_COMMUNITY): Payer: Self-pay | Admitting: *Deleted

## 2018-04-15 ENCOUNTER — Encounter (HOSPITAL_COMMUNITY)
Admission: AD | Disposition: A | Discharge status: Home / Self Care | Payer: Self-pay | Source: Home / Self Care | Attending: Obstetrics and Gynecology

## 2018-04-15 DIAGNOSIS — Z3A00 Weeks of gestation of pregnancy not specified: Secondary | ICD-10-CM | POA: Diagnosis not present

## 2018-04-15 DIAGNOSIS — N838 Other noninflammatory disorders of ovary, fallopian tube and broad ligament: Secondary | ICD-10-CM | POA: Diagnosis not present

## 2018-04-15 DIAGNOSIS — O99214 Obesity complicating childbirth: Secondary | ICD-10-CM | POA: Diagnosis not present

## 2018-04-15 DIAGNOSIS — O99824 Streptococcus B carrier state complicating childbirth: Secondary | ICD-10-CM | POA: Diagnosis present

## 2018-04-15 DIAGNOSIS — O34211 Maternal care for low transverse scar from previous cesarean delivery: Secondary | ICD-10-CM | POA: Diagnosis not present

## 2018-04-15 DIAGNOSIS — Z302 Encounter for sterilization: Secondary | ICD-10-CM | POA: Diagnosis not present

## 2018-04-15 DIAGNOSIS — Z3A39 39 weeks gestation of pregnancy: Secondary | ICD-10-CM

## 2018-04-15 DIAGNOSIS — O34219 Maternal care for unspecified type scar from previous cesarean delivery: Secondary | ICD-10-CM | POA: Diagnosis not present

## 2018-04-15 DIAGNOSIS — Z9889 Other specified postprocedural states: Secondary | ICD-10-CM

## 2018-04-15 HISTORY — PX: BILATERAL SALPINGECTOMY: SHX5743

## 2018-04-15 LAB — TYPE AND SCREEN
ABO/RH(D): A NEG
ANTIBODY SCREEN: NEGATIVE

## 2018-04-15 LAB — CBC
HCT: 38.2 % (ref 36.0–46.0)
HEMOGLOBIN: 12.9 g/dL (ref 12.0–15.0)
MCH: 31.9 pg (ref 26.0–34.0)
MCHC: 33.8 g/dL (ref 30.0–36.0)
MCV: 94.3 fL (ref 80.0–100.0)
Platelets: 199 10*3/uL (ref 150–400)
RBC: 4.05 MIL/uL (ref 3.87–5.11)
RDW: 13.3 % (ref 11.5–15.5)
WBC: 9.1 10*3/uL (ref 4.0–10.5)

## 2018-04-15 LAB — POCT FERN TEST: POCT Fern Test: POSITIVE

## 2018-04-15 SURGERY — Surgical Case
Anesthesia: Spinal | Site: Abdomen

## 2018-04-15 SURGERY — Surgical Case
Anesthesia: Regional

## 2018-04-15 MED ORDER — OXYTOCIN 40 UNITS IN NORMAL SALINE INFUSION - SIMPLE MED
INTRAVENOUS | Status: AC
  Filled 2018-04-15: qty 1000

## 2018-04-15 MED ORDER — OXYTOCIN 40 UNITS IN NORMAL SALINE INFUSION - SIMPLE MED: 2.5000 [IU]/h | INTRAVENOUS | Status: AC

## 2018-04-15 MED ORDER — FAMOTIDINE IN NACL 20-0.9 MG/50ML-% IV SOLN
INTRAVENOUS | Status: AC
  Filled 2018-04-15: qty 50

## 2018-04-15 MED ORDER — DEXTROSE 5 % IV SOLN
INTRAVENOUS | Status: DC | PRN
  Administered 2018-04-15: 3 mg via INTRAVENOUS

## 2018-04-15 MED ORDER — OXYTOCIN 10 UNIT/ML IJ SOLN
INTRAVENOUS | Status: DC | PRN
  Administered 2018-04-15: 40 [IU] via INTRAVENOUS

## 2018-04-15 MED ORDER — DIPHENHYDRAMINE HCL 25 MG PO CAPS: 25.0000 mg | ORAL_CAPSULE | Freq: Four times a day (QID) | ORAL | Status: DC | PRN

## 2018-04-15 MED ORDER — ONDANSETRON HCL 4 MG/2ML IJ SOLN
INTRAMUSCULAR | Status: DC | PRN
  Administered 2018-04-15: 4 mg via INTRAVENOUS

## 2018-04-15 MED ORDER — TETANUS-DIPHTH-ACELL PERTUSSIS 5-2.5-18.5 LF-MCG/0.5 IM SUSP: 0.5000 mL | Freq: Once | INTRAMUSCULAR | Status: DC

## 2018-04-15 MED ORDER — MORPHINE SULFATE (PF) 0.5 MG/ML IJ SOLN
INTRAMUSCULAR | Status: AC
  Filled 2018-04-15: qty 10

## 2018-04-15 MED ORDER — SOD CITRATE-CITRIC ACID 500-334 MG/5ML PO SOLN
ORAL | Status: AC
  Filled 2018-04-15: qty 15

## 2018-04-15 MED ORDER — FENTANYL CITRATE (PF) 100 MCG/2ML IJ SOLN
INTRAMUSCULAR | Status: AC
  Filled 2018-04-15: qty 2

## 2018-04-15 MED ORDER — LACTATED RINGERS IV SOLN
INTRAVENOUS | Status: DC
  Administered 2018-04-15 (×2): via INTRAVENOUS

## 2018-04-15 MED ORDER — IBUPROFEN 800 MG PO TABS
800.0000 mg | ORAL_TABLET | Freq: Three times a day (TID) | ORAL | Status: DC
  Administered 2018-04-16 – 2018-04-17 (×5): 800 mg via ORAL
  Filled 2018-04-15 (×5): qty 1

## 2018-04-15 MED ORDER — NALBUPHINE HCL 10 MG/ML IJ SOLN: 5.0000 mg | INTRAMUSCULAR | Status: DC | PRN

## 2018-04-15 MED ORDER — NALOXONE HCL 4 MG/10ML IJ SOLN: 1.0000 ug/kg/h | INTRAVENOUS | Status: DC | PRN

## 2018-04-15 MED ORDER — SOD CITRATE-CITRIC ACID 500-334 MG/5ML PO SOLN
30.0000 mL | Freq: Once | ORAL | Status: AC
  Administered 2018-04-15: 30 mL via ORAL

## 2018-04-15 MED ORDER — SIMETHICONE 80 MG PO CHEW
80.0000 mg | CHEWABLE_TABLET | Freq: Three times a day (TID) | ORAL | Status: DC
  Administered 2018-04-16 – 2018-04-17 (×5): 80 mg via ORAL
  Filled 2018-04-15 (×5): qty 1

## 2018-04-15 MED ORDER — LORATADINE 10 MG PO TABS
10.0000 mg | ORAL_TABLET | Freq: Every day | ORAL | Status: DC
  Filled 2018-04-15: qty 1

## 2018-04-15 MED ORDER — KETOROLAC TROMETHAMINE 30 MG/ML IJ SOLN
INTRAMUSCULAR | Status: AC
  Filled 2018-04-15: qty 1

## 2018-04-15 MED ORDER — FLEET ENEMA 7-19 GM/118ML RE ENEM: 1.0000 | ENEMA | Freq: Every day | RECTAL | Status: DC | PRN

## 2018-04-15 MED ORDER — LACTATED RINGERS IV SOLN
INTRAVENOUS | Status: DC | PRN
  Administered 2018-04-15: 20:00:00 via INTRAVENOUS

## 2018-04-15 MED ORDER — NALBUPHINE HCL 10 MG/ML IJ SOLN: 5.0000 mg | Freq: Once | INTRAMUSCULAR | Status: DC | PRN

## 2018-04-15 MED ORDER — SODIUM CHLORIDE 0.9 % IR SOLN
Status: DC | PRN
  Administered 2018-04-15: 1

## 2018-04-15 MED ORDER — METHYLERGONOVINE MALEATE 0.2 MG/ML IJ SOLN: 0.2000 mg | INTRAMUSCULAR | Status: DC | PRN

## 2018-04-15 MED ORDER — DIPHENHYDRAMINE HCL 25 MG PO CAPS: 25.0000 mg | ORAL_CAPSULE | ORAL | Status: DC | PRN

## 2018-04-15 MED ORDER — LACTATED RINGERS IV SOLN
INTRAVENOUS | Status: DC
  Administered 2018-04-16: 12:00:00 via INTRAVENOUS

## 2018-04-15 MED ORDER — PHENYLEPHRINE 8 MG IN D5W 100 ML (0.08MG/ML) PREMIX OPTIME
INJECTION | INTRAVENOUS | Status: DC | PRN
  Administered 2018-04-15: 60 ug/min via INTRAVENOUS

## 2018-04-15 MED ORDER — SCOPOLAMINE 1 MG/3DAYS TD PT72
MEDICATED_PATCH | TRANSDERMAL | Status: DC | PRN
  Administered 2018-04-15: 1 via TRANSDERMAL

## 2018-04-15 MED ORDER — MEASLES, MUMPS & RUBELLA VAC IJ SOLR: 0.5000 mL | Freq: Once | INTRAMUSCULAR | Status: DC

## 2018-04-15 MED ORDER — SENNOSIDES-DOCUSATE SODIUM 8.6-50 MG PO TABS
2.0000 | ORAL_TABLET | ORAL | Status: DC
  Administered 2018-04-15 – 2018-04-17 (×2): 2 via ORAL
  Filled 2018-04-15 (×2): qty 2

## 2018-04-15 MED ORDER — PHENYLEPHRINE 8 MG IN D5W 100 ML (0.08MG/ML) PREMIX OPTIME
INJECTION | INTRAVENOUS | Status: AC
  Filled 2018-04-15: qty 100

## 2018-04-15 MED ORDER — NALOXONE HCL 0.4 MG/ML IJ SOLN: 0.4000 mg | INTRAMUSCULAR | Status: DC | PRN

## 2018-04-15 MED ORDER — DEXAMETHASONE SODIUM PHOSPHATE 10 MG/ML IJ SOLN
INTRAMUSCULAR | Status: DC | PRN
  Administered 2018-04-15: 10 mg via INTRAVENOUS

## 2018-04-15 MED ORDER — ZOLPIDEM TARTRATE 5 MG PO TABS: 5.0000 mg | ORAL_TABLET | Freq: Every evening | ORAL | Status: DC | PRN

## 2018-04-15 MED ORDER — SCOPOLAMINE 1 MG/3DAYS TD PT72: 1.0000 | MEDICATED_PATCH | Freq: Once | TRANSDERMAL | Status: DC

## 2018-04-15 MED ORDER — OXYTOCIN 10 UNIT/ML IJ SOLN
INTRAMUSCULAR | Status: AC
  Filled 2018-04-15: qty 4

## 2018-04-15 MED ORDER — COCONUT OIL OIL: 1.0000 "application " | TOPICAL_OIL | Status: DC | PRN

## 2018-04-15 MED ORDER — ONDANSETRON HCL 4 MG/2ML IJ SOLN
INTRAMUSCULAR | Status: AC
  Filled 2018-04-15: qty 2

## 2018-04-15 MED ORDER — KETOROLAC TROMETHAMINE 30 MG/ML IJ SOLN
30.0000 mg | Freq: Once | INTRAMUSCULAR | Status: AC
  Administered 2018-04-15: 30 mg via INTRAVENOUS

## 2018-04-15 MED ORDER — MORPHINE SULFATE (PF) 0.5 MG/ML IJ SOLN
INTRAMUSCULAR | Status: DC | PRN
  Administered 2018-04-15: .15 mg via INTRATHECAL

## 2018-04-15 MED ORDER — SIMETHICONE 80 MG PO CHEW: 80.0000 mg | CHEWABLE_TABLET | ORAL | Status: DC | PRN

## 2018-04-15 MED ORDER — BISACODYL 10 MG RE SUPP: 10.0000 mg | Freq: Every day | RECTAL | Status: DC | PRN

## 2018-04-15 MED ORDER — DEXAMETHASONE SODIUM PHOSPHATE 4 MG/ML IJ SOLN
INTRAMUSCULAR | Status: AC
  Filled 2018-04-15: qty 1

## 2018-04-15 MED ORDER — ACETAMINOPHEN 10 MG/ML IV SOLN: 1000.0000 mg | Freq: Once | INTRAVENOUS | Status: DC | PRN

## 2018-04-15 MED ORDER — PENICILLIN G POTASSIUM 5000000 UNITS IJ SOLR: 5.0000 10*6.[IU] | Freq: Once | INTRAMUSCULAR | Status: DC

## 2018-04-15 MED ORDER — BUPIVACAINE IN DEXTROSE 0.75-8.25 % IT SOLN
INTRATHECAL | Status: DC | PRN
  Administered 2018-04-15: 2 mL via INTRATHECAL

## 2018-04-15 MED ORDER — FERROUS SULFATE 325 (65 FE) MG PO TABS
325.0000 mg | ORAL_TABLET | Freq: Two times a day (BID) | ORAL | Status: DC
  Administered 2018-04-16 – 2018-04-17 (×3): 325 mg via ORAL
  Filled 2018-04-15 (×3): qty 1

## 2018-04-15 MED ORDER — SIMETHICONE 80 MG PO CHEW
80.0000 mg | CHEWABLE_TABLET | ORAL | Status: DC
  Administered 2018-04-15 – 2018-04-17 (×2): 80 mg via ORAL
  Filled 2018-04-15 (×2): qty 1

## 2018-04-15 MED ORDER — DEXTROSE 5 % IV SOLN
3.0000 g | INTRAVENOUS | Status: DC
  Filled 2018-04-15: qty 3000

## 2018-04-15 MED ORDER — PRENATAL MULTIVITAMIN CH
1.0000 | ORAL_TABLET | Freq: Every day | ORAL | Status: DC
  Administered 2018-04-16: 1 via ORAL
  Filled 2018-04-15 (×2): qty 1

## 2018-04-15 MED ORDER — DIBUCAINE 1 % RE OINT: 1.0000 "application " | TOPICAL_OINTMENT | RECTAL | Status: DC | PRN

## 2018-04-15 MED ORDER — SODIUM CHLORIDE 0.9 % IV SOLN
5.0000 10*6.[IU] | Freq: Once | INTRAVENOUS | Status: AC
  Administered 2018-04-15: 5 10*6.[IU] via INTRAVENOUS
  Filled 2018-04-15: qty 5

## 2018-04-15 MED ORDER — MENTHOL 3 MG MT LOZG: 1.0000 | LOZENGE | OROMUCOSAL | Status: DC | PRN

## 2018-04-15 MED ORDER — OXYCODONE-ACETAMINOPHEN 5-325 MG PO TABS: 1.0000 | ORAL_TABLET | ORAL | Status: DC | PRN

## 2018-04-15 MED ORDER — SODIUM CHLORIDE 0.9 % IV SOLN
500.0000 mg | Freq: Once | INTRAVENOUS | Status: AC
  Administered 2018-04-15: 500 mg via INTRAVENOUS
  Filled 2018-04-15: qty 500

## 2018-04-15 MED ORDER — FAMOTIDINE IN NACL 20-0.9 MG/50ML-% IV SOLN
20.0000 mg | Freq: Once | INTRAVENOUS | Status: AC
  Administered 2018-04-15: 20 mg via INTRAVENOUS

## 2018-04-15 MED ORDER — ONDANSETRON HCL 4 MG/2ML IJ SOLN: 4.0000 mg | Freq: Three times a day (TID) | INTRAMUSCULAR | Status: DC | PRN

## 2018-04-15 MED ORDER — DIPHENHYDRAMINE HCL 50 MG/ML IJ SOLN: 12.5000 mg | INTRAMUSCULAR | Status: DC | PRN

## 2018-04-15 MED ORDER — FENTANYL CITRATE (PF) 100 MCG/2ML IJ SOLN
INTRAMUSCULAR | Status: DC | PRN
  Administered 2018-04-15: 15 ug via INTRATHECAL

## 2018-04-15 MED ORDER — METHYLERGONOVINE MALEATE 0.2 MG PO TABS: 0.2000 mg | ORAL_TABLET | ORAL | Status: DC | PRN

## 2018-04-15 MED ORDER — SODIUM CHLORIDE 0.9% FLUSH: 3.0000 mL | INTRAVENOUS | Status: DC | PRN

## 2018-04-15 MED ORDER — WITCH HAZEL-GLYCERIN EX PADS: 1.0000 "application " | MEDICATED_PAD | CUTANEOUS | Status: DC | PRN

## 2018-04-15 SURGICAL SUPPLY — 32 items
BENZOIN TINCTURE PRP APPL 2/3 (GAUZE/BANDAGES/DRESSINGS) ×3 IMPLANT
CHLORAPREP W/TINT 26ML (MISCELLANEOUS) ×3 IMPLANT
CLAMP CORD UMBIL (MISCELLANEOUS) IMPLANT
CLOSURE STERI STRIP 1/2 X4 (GAUZE/BANDAGES/DRESSINGS) ×3 IMPLANT
CLOTH BEACON ORANGE TIMEOUT ST (SAFETY) ×3 IMPLANT
DRSG OPSITE POSTOP 4X10 (GAUZE/BANDAGES/DRESSINGS) ×3 IMPLANT
ELECT REM PT RETURN 9FT ADLT (ELECTROSURGICAL) ×3
ELECTRODE REM PT RTRN 9FT ADLT (ELECTROSURGICAL) ×2 IMPLANT
EXTRACTOR VACUUM BELL STYLE (SUCTIONS) IMPLANT
GLOVE BIO SURGEON STRL SZ7 (GLOVE) ×6 IMPLANT
GLOVE BIOGEL PI IND STRL 7.0 (GLOVE) ×6 IMPLANT
GLOVE BIOGEL PI INDICATOR 7.0 (GLOVE) ×3
GOWN STRL REUS W/TWL LRG LVL3 (GOWN DISPOSABLE) ×6 IMPLANT
KIT ABG SYR 3ML LUER SLIP (SYRINGE) IMPLANT
NEEDLE HYPO 25X5/8 SAFETYGLIDE (NEEDLE) IMPLANT
NS IRRIG 1000ML POUR BTL (IV SOLUTION) ×3 IMPLANT
PACK C SECTION WH (CUSTOM PROCEDURE TRAY) ×3 IMPLANT
PAD ABD DERMACEA PRESS 5X9 (GAUZE/BANDAGES/DRESSINGS) ×3 IMPLANT
PAD OB MATERNITY 4.3X12.25 (PERSONAL CARE ITEMS) ×3 IMPLANT
PENCIL SMOKE EVAC W/HOLSTER (ELECTROSURGICAL) ×3 IMPLANT
RTRCTR C-SECT PINK 25CM LRG (MISCELLANEOUS) ×3 IMPLANT
STRIP CLOSURE SKIN 1/2X4 (GAUZE/BANDAGES/DRESSINGS) ×3 IMPLANT
SUT MNCRL 0 VIOLET CTX 36 (SUTURE) ×4 IMPLANT
SUT MONOCRYL 0 CTX 36 (SUTURE) ×2
SUT PLAIN 2 0 XLH (SUTURE) IMPLANT
SUT VIC AB 0 CT1 27 (SUTURE) ×2
SUT VIC AB 0 CT1 27XBRD ANBCTR (SUTURE) ×4 IMPLANT
SUT VIC AB 2-0 CT1 27 (SUTURE) ×3
SUT VIC AB 2-0 CT1 TAPERPNT 27 (SUTURE) ×6 IMPLANT
SUT VIC AB 4-0 KS 27 (SUTURE) ×3 IMPLANT
TOWEL OR 17X24 6PK STRL BLUE (TOWEL DISPOSABLE) ×3 IMPLANT
TRAY FOLEY W/BAG SLVR 14FR LF (SET/KITS/TRAYS/PACK) ×3 IMPLANT

## 2018-04-15 NOTE — Transfer of Care (Signed)
Immediate Anesthesia Transfer of Care Note  Patient: Wilmuth Olivetti  Procedure(s) Performed: CESAREAN SECTION (N/A Abdomen) BILATERAL SALPINGECTOMY (Bilateral )  Patient Location: PACU  Anesthesia Type:Spinal  Level of Consciousness: awake  Airway & Oxygen Therapy: Patient Spontanous Breathing  Post-op Assessment: Report given to RN and Post -op Vital signs reviewed and stable  Post vital signs: Reviewed and stable  Last Vitals:  Vitals Value Taken Time  BP    Temp    Pulse 100 04/15/2018  9:00 PM  Resp    SpO2 98 % 04/15/2018  9:00 PM  Vitals shown include unvalidated device data.  Last Pain:  Vitals:   04/15/18 1715  TempSrc:   PainSc: 0-No pain      Patients Stated Pain Goal: 4 (04/15/18 1517)  Complications: No apparent anesthesia complications

## 2018-04-15 NOTE — MAU Note (Signed)
Pt reports water broke around 10:30. Clear fluid. Denies any ctx at this time. good fetal movement reported.

## 2018-04-15 NOTE — Patient Instructions (Signed)
Katie Glass  04/15/2018   Your procedure is scheduled on:  04/16/2018  Enter through the Main Entrance of [facility] at 1000 AM.  Pick up the phone at the desk and dial [phone]  Call this number if you have problems the morning of [phone]  Remember:   Do not eat food:(After Midnight) Desps de medianoche.  Do not drink clear liquids: (After Midnight) Desps de medianoche.  Take these medicines the morning of surgery with A SIP OF WATER: none   Do not wear jewelry, make-up or nail polish.  Do not wear lotions, powders, or perfumes. Do not wear deodorant.  Do not shave 48 hours prior to surgery.  Do not bring valuables to the hospital.  [facility] is not   responsible for any belongings or valuables brought to the hospital.  Contacts, dentures or bridgework may not be worn into surgery.  Leave suitcase in the car. After surgery it may be brought to your room.  For patients admitted to the hospital, checkout time is 11:00 AM the day of              discharge.    N/A   Please read over the following fact sheets that you were given:   Surgical Site Infection Prevention

## 2018-04-15 NOTE — Op Note (Signed)
04/15/2018  8:38 PM  PATIENT:  Katie Glass  43 y.o. female  PRE-OPERATIVE DIAGNOSIS:  cesarean section with bilateral tubal ligation with salpingectomy  POST-OPERATIVE DIAGNOSIS:  Repeat cesarean section at term after ROM and desires permanent sterilization.  PROCEDURE:  Procedure(s): CESAREAN SECTION (N/A) BILATERAL SALPINGECTOMY (Bilateral)  SURGEON:  Surgeon(s) and Role:    Carrington Clamp, MD - Primary  ANESTHESIA:   spinal  EBL:  156 cc per machines  SPECIMEN:  Source of Specimen:  bilateral tubes  DISPOSITION OF SPECIMEN:  PATHOLOGY  COUNTS:  YES  TOURNIQUET:  * No tourniquets in log *  DICTATION: .Note written in EPIC  PLAN OF CARE: Admit to inpatient   PATIENT DISPOSITION:  PACU - hemodynamically stable.   Delay start of Pharmacological VTE agent (>24hrs) due to surgical blood loss or risk of bleeding: not applicable Complications:  none Medications:  Ancef, Pitocin Findings:  Baby female, Apgars 10,10, weight P.   Normal tubes, ovaries and uterus seen.  Baby was skin to skin with mother after birth in the OR.  Technique:  After adequate spinal anesthesia was achieved, the patient was prepped and draped in usual sterile fashion.  A foley catheter was used to drain the bladder.  A pfannanstiel incision was made with the scalpel and carried down to the fascia with the bovie cautery. The fascia was incised in the midline with the scalpel and carried in a transverse curvilinear manner bilaterally.  The fascia was reflected superiorly and inferiorly off the rectus muscles and the muscles split in the midline.  A bowel free portion of the peritoneum was entered bluntly and then extended in a superior and inferior manner with good visualization of the bowel and bladder.  The Alexis instrument was then placed and the vesico-uterine fascia tented up and incised in a transverse curvilinear manner.  A 2 cm transverse incision was made in the upper portion of the lower  uterine segment until the amnion was exposed.   The incision was extended transversely in a blunt manner.  Clear fluid was noted and the baby delivered in the vertex presentation without complication.  The baby was bulb suctioned and the cord was clamped and cut aftet stripping blood from cord into baby.  The baby was then handed to awaiting Neonatology.  The placenta was then delivered manually and the uterus cleared of all debris.  The uterine incision was then closed with a running lock stitch of 0 monocryl.  An imbricating layer of 0 monocryl was closed as well. Excellent hemostasis of the uterine incision was achieved and the abdomen was cleared with irrigation.   Each tube was grasped with a babcock and a clear area of the mesosalpinx was entered with the bovie.  A kelly clamp was placed on the mesosalpinx under the fimbriae to the hole.  Two stitches of 0 plain gut were passed through the incision and tied under the clamp.  (On the L side only one stitch was used.)  A kelly was then placed on the cornual area of the tube and the mesosalpinx back to the incision.  Two stitches were placed under the clamp and secured the vessels.  Each portion of the tubes were then excised completely at each pedicle and removed.  Bovie cautery was used to ensure hemostasis of all pedicles.  The uterus was returned to the abdomen and the pedicles rechecked again and found to be hemostatic.   The peritoneum was closed with a running stitch of 2-0  vicryl.  This incorporated the rectus muscles as a separate layer.  The fascia was then closed with a running stitch of 0 vicryl.  The subcutaneous layer was closed with interrupted  stitches of 2-0 plain gut.  The skin was closed with 4-0 vicryl on a Keith needle and steri-strips.  The patient tolerated the procedure well and was returned to the recovery room in stable condition.  All counts were correct times three.  Loney Laurence

## 2018-04-15 NOTE — H&P (Signed)
43 y.o.  G3P1010 [redacted]w[redacted]d comes in for ROM at term.  She was to have a repeat cesarean section at term tomorrow.  Patient has good fetal movement and no bleeding.  Will proceed with section tonight at 1930 since she ate [employer] at 1130 this morning.   Past Medical History:  Diagnosis Date  . Arthritis    knees  . Asthma    as a child  . MMT (medial meniscus tear)    right  . Sleep apnea    CPAP nightly    Past Surgical History:  Procedure Laterality Date  . CESAREAN SECTION  11/12/2010   Procedure: CESAREAN SECTION;  Surgeon: Almon Hercules;  Location: [facility];  Service: Gynecology;  Laterality: N/A;  . KNEE ARTHROSCOPY Right 02/11/2017   Procedure: RIGHT KNEE ARTHROSCOPY WITH DEBRIDEMENT CHONDROMALACIA;  Surgeon: Gean Birchwood, MD;  Location: [name];  Service: Orthopedics;  Laterality: Right;    OB History  Gravida Para Term Preterm AB Living  3 1 1  0 1 0  SAB TAB Ectopic Multiple Live Births  1 0 0 0      # Outcome Date GA Lbr Len/2nd Weight Sex Delivery Anes PTL Lv  3 Current           2 Term 11/12/10 [redacted]w[redacted]d    CS-LTranv EPI    1 SAB             Social History   Socioeconomic History  . Marital status: Married    Spouse name: Not on file  . Number of children: Not on file  . Years of education: Not on file  . Highest education level: Not on file  Occupational History  . Not on file  Social Needs  . Financial resource strain: Not hard at all  . Food insecurity:    Worry: Never true    Inability: Never true  . Transportation needs:    Medical: No    Non-medical: Not on file  Tobacco Use  . Smoking status: Never Smoker  . Smokeless tobacco: Never Used  Substance and Sexual Activity  . Alcohol use: Yes    Comment: social  . Drug use: No  . Sexual activity: Yes    Birth control/protection: None  Lifestyle  . Physical activity:    Days per week: Not on file    Minutes per session: Not on file  . Stress: Very much  Relationships  . Social  connections:    Talks on phone: Not on file    Gets together: Not on file    Attends religious service: Not on file    Active member of club or organization: Not on file    Attends meetings of clubs or organizations: Not on file    Relationship status: Not on file  . Intimate partner violence:    Fear of current or ex partner: No    Emotionally abused: No    Physically abused: No    Forced sexual activity: No  Other Topics Concern  . Not on file  Social History Narrative  . Not on file   Patient has no known allergies.   Prenatal Course: uncomplicated.   Prenatal Transfer Tool  Maternal Diabetes: No Genetic Screening: Normal- NIPT low risk female Maternal Ultrasounds/Referrals: Normal Fetal Ultrasounds or other Referrals:  None Maternal Substance Abuse:  No Significant Maternal Medications:  None Significant Maternal Lab Results: Lab values include: Group B Strep positive  Vitals:   04/15/18 1309 04/15/18 1321  BP: 138/63 120/75  Pulse: 95   Resp: 18 18  Temp: 98.7 F (37.1 C)   Weight: 131.6 kg   Height: 5\' 6"  (1.676 m)     Lungs/Cor:  NAD Abdomen:  soft, gravid Ex:  no cords, erythema SVE:  NA FHTs:  140s, gSTV, NST R Toco q3-5  A/P   For repeat cesarean sectionat term.  All risks, benefits and alternatives discussed with patient and she desires to proceed.  Will do a dose of PCN for ROM in meantime.  Pt is also for a bilateral salpingectomies for sterilization.  Loney Laurence

## 2018-04-15 NOTE — Anesthesia Procedure Notes (Signed)
Spinal  Patient location during procedure: OR Start time: 04/15/2018 7:29 PM End time: 04/15/2018 7:39 PM Staffing Anesthesiologist: Elmer Picker, MD Performed: anesthesiologist  Preanesthetic Checklist Completed: patient identified, surgical consent, pre-op evaluation, timeout performed, IV checked, risks and benefits discussed and monitors and equipment checked Spinal Block Patient position: sitting Prep: site prepped and draped and DuraPrep Patient monitoring: cardiac monitor, continuous pulse ox and blood pressure Approach: midline Location: L3-4 Injection technique: single-shot Needle Needle type: Pencan  Needle gauge: 24 G Needle length: 9 cm Assessment Sensory level: T6 Additional Notes Functioning IV was confirmed and monitors were applied. Sterile prep and drape, including hand hygiene and sterile gloves were used. The patient was positioned and the spine was prepped. The skin was anesthetized with lidocaine.  Free flow of clear CSF was obtained prior to injecting local anesthetic into the CSF.  The spinal needle aspirated freely following injection.  The needle was carefully withdrawn.  The patient tolerated the procedure well.

## 2018-04-15 NOTE — Anesthesia Preprocedure Evaluation (Addendum)
Anesthesia Evaluation  Patient identified by MRN, date of birth, ID band Patient awake    Reviewed: Allergy & Precautions, NPO status , Patient's Chart, lab work & pertinent test results  History of Anesthesia Complications Negative for: history of anesthetic complications  Airway Mallampati: II  TM Distance: >3 FB Neck ROM: Full    Dental no notable dental hx. (+) Teeth Intact, Dental Advisory Given   Pulmonary neg pulmonary ROS, asthma , sleep apnea and Continuous Positive Airway Pressure Ventilation ,    Pulmonary exam normal breath sounds clear to auscultation       Cardiovascular negative cardio ROS Normal cardiovascular exam Rhythm:Regular Rate:Normal     Neuro/Psych negative neurological ROS  negative psych ROS   GI/Hepatic negative GI ROS, Neg liver ROS,   Endo/Other  Morbid obesity  Renal/GU negative Renal ROS  negative genitourinary   Musculoskeletal negative musculoskeletal ROS (+) Arthritis ,   Abdominal   Peds  Hematology negative hematology ROS (+)   Anesthesia Other Findings   Reproductive/Obstetrics (+) Pregnancy Hx of C/S x1                           Anesthesia Physical Anesthesia Plan  ASA: III  Anesthesia Plan: Spinal   Post-op Pain Management:    Induction:   PONV Risk Score and Plan: 2 and Treatment may vary due to age or medical condition, Ondansetron and Dexamethasone  Airway Management Planned: Natural Airway  Additional Equipment:   Intra-op Plan:   Post-operative Plan:   Informed Consent: I have reviewed the patients History and Physical, chart, labs and discussed the procedure including the risks, benefits and alternatives for the proposed anesthesia with the patient or authorized representative who has indicated his/her understanding and acceptance.     Dental advisory given  Plan Discussed with: CRNA  Anesthesia Plan Comments:        Anesthesia Quick Evaluation

## 2018-04-15 NOTE — MAU Note (Signed)
Urine in lab 

## 2018-04-15 NOTE — Brief Op Note (Signed)
04/15/2018  8:38 PM  PATIENT:  Katie Glass  43 y.o. female  PRE-OPERATIVE DIAGNOSIS:  cesarean section with bilateral tubal ligation with salpingectomy  POST-OPERATIVE DIAGNOSIS:  Repeat cesarean section at term after ROM and desires permanent sterilization.  PROCEDURE:  Procedure(s): CESAREAN SECTION (N/A) BILATERAL SALPINGECTOMY (Bilateral)  SURGEON:  Surgeon(s) and Role:    Carrington Clamp, MD - Primary  ANESTHESIA:   spinal  EBL:  156 cc per machines  SPECIMEN:  Source of Specimen:  bilateral tubes  DISPOSITION OF SPECIMEN:  PATHOLOGY  COUNTS:  YES  TOURNIQUET:  * No tourniquets in log *  DICTATION: .Note written in EPIC  PLAN OF CARE: Admit to inpatient   PATIENT DISPOSITION:  PACU - hemodynamically stable.   Delay start of Pharmacological VTE agent (>24hrs) due to surgical blood loss or risk of bleeding: not applicable

## 2018-04-16 ENCOUNTER — Encounter (HOSPITAL_COMMUNITY): Payer: Self-pay | Admitting: Obstetrics and Gynecology

## 2018-04-16 LAB — CBC
HCT: 34.6 % — ABNORMAL LOW (ref 36.0–46.0)
Hemoglobin: 11.6 g/dL — ABNORMAL LOW (ref 12.0–15.0)
MCH: 32 pg (ref 26.0–34.0)
MCHC: 33.5 g/dL (ref 30.0–36.0)
MCV: 95.6 fL (ref 80.0–100.0)
NRBC: 0 % (ref 0.0–0.2)
PLATELETS: 198 10*3/uL (ref 150–400)
RBC: 3.62 MIL/uL — ABNORMAL LOW (ref 3.87–5.11)
RDW: 13 % (ref 11.5–15.5)
WBC: 13.8 10*3/uL — ABNORMAL HIGH (ref 4.0–10.5)

## 2018-04-16 LAB — RPR: RPR Ser Ql: NONREACTIVE

## 2018-04-16 MED ORDER — ACETAMINOPHEN 325 MG PO TABS
650.0000 mg | ORAL_TABLET | ORAL | Status: DC | PRN
  Administered 2018-04-16: 650 mg via ORAL
  Filled 2018-04-16: qty 2

## 2018-04-16 NOTE — Lactation Note (Signed)
This note was copied from a baby's chart. Lactation Consultation Note:  P2, infant is now 14 hours old. Mother reports that she  breastfed and pumped with her now 43 yr old.   Mother reports that infant breastfed at delivery for 25 mins.  Mother reports that infant has not latched well since.   Advised mother to continue to offer breast with feeding cues.   Infant placed in cross cradle hold. He was unable to sustain latch.  Observed bouncing on and off for multiple attempts.  Infant latched on alternate breast in football and cross cradle.  Infant continued to bounce on and off multiple times.   Mother taught off sided latch technique and tea-cup hold.  Mother tends to pull tissue out of infants mouth.   Encouraged to breastfeed infant 8-12 times in 24 hours . Discussed cluster feeding.   Mother was given a DEBP kit at her request.  She has a old Medela at home and plans to get [employer] to give her a new one.   Advised mother to hand express or use hand pump and offer infant any amt of ebm with spoon.  Mother reports that she used a nipple shield with her first child.  She brought nipple shields from home.  I was unable to assess infants oral cavity.   Advised mother to allow [facility] or nurse to fit with new nipple shield if needed. Mother has 4 visitors in hallway. She was eager to visit.   She began removing infant from breast and redressing infant.   Mother was given [facility] brochure and advised in all [place] resources at [facility].  Patient Name: Katie Glass [id number] Date: 04/16/2018 Reason for consult: Follow-up assessment   Maternal Data Has patient been taught Hand Expression?: Yes Does the patient have breastfeeding experience prior to this delivery?: Yes  Feeding Feeding Type: Breast Fed  LATCH Score Latch: Repeated attempts needed to sustain latch, nipple held in mouth throughout feeding, stimulation needed to elicit sucking reflex.  Audible Swallowing: A few with  stimulation  Type of Nipple: Everted at rest and after stimulation  Comfort (Breast/Nipple): Soft / non-tender  Hold (Positioning): Assistance needed to correctly position infant at breast and maintain latch.  LATCH Score: 7  Interventions Interventions: Breast feeding basics reviewed;Assisted with latch;Skin to skin;Hand express;Reverse pressure;Breast compression;Adjust position;Support pillows;Position options;Expressed milk;Hand pump;DEBP  Lactation Tools Discussed/Used     Consult Status Consult Status: Follow-up Date: 04/17/18 Follow-up type: In-patient    Jess Barters [facility] 04/16/2018, 2:51 PM

## 2018-04-16 NOTE — Progress Notes (Signed)
Dr. Claiborne Billings notified of leaving patients foley catheter in this morning for a little longer, due to blood tinged urine, but good urine output. Patient ambulates well, tolerating po fluids well, IV infusing Lactated Ringers at 125/ml hour at current time while watching urine color in Foley.

## 2018-04-16 NOTE — Progress Notes (Signed)
Patient is eating, ambulating.  Pain control is good.  Appropriate lochia, good UOP.  No complaints.  Vitals:   04/15/18 2305 04/16/18 0005 04/16/18 0105 04/16/18 0511  BP: 120/61 106/64  104/60  Pulse: 87 74  60  Resp: 16 16 18 16   Temp: 98.8 F (37.1 C) 98.5 F (36.9 C) 98.9 F (37.2 C) 98 F (36.7 C)  TempSrc: Oral Oral Oral Oral  SpO2: 96% 96% 97% 97%  Weight:      Height:        Fundus firm Incision: c/d/i Ext: no calf tenderness  Lab Results  Component Value Date   WBC 13.8 (H) 04/16/2018   HGB 11.6 (L) 04/16/2018   HCT 34.6 (L) 04/16/2018   MCV 95.6 04/16/2018   PLT 198 04/16/2018    --/--/A NEG (01/23 1019)  A/P Post op day #1 s/p repeat c/s and bilateral salpingectomy Desires circ, consent obtained, baby not yet cleared by peds.  Routine care.      Philip Aspen

## 2018-04-16 NOTE — Progress Notes (Signed)
CSW received consult due to score 13 on [place] Depression Screen.    CSW met with MOB via bedside due to [facility] score. MOB was pleasant and appropriate during conversation. MOB has one other child, age 44. MOB has one other MOB voiced recently leaving her full time job about two weeks due to "having differences". MOB states this has been a stressor for her however voices leaving her job was a necessary step for her to take for her mental health. MOB states she has been feeling some anxiety due to this recent change in job however is feeling better and feels her spouse, Liane Comber, is a good support for her. MOB states spouse and both families are supportive. MOB voiced no concerns at this time.   CSW provided education regarding Baby Blues vs PMADs and provided MOB with resources for mental health follow up.  CSW encouraged MOB to evaluate her mental health throughout the postpartum period with the use of the New Mom Checklist developed by Postpartum [employer] as well as the [place] Postnatal Depression Scale and notify a medical professional if symptoms arise.     Kingsley Spittle, [place]  [phone]

## 2018-04-17 MED ORDER — OXYCODONE-ACETAMINOPHEN 5-325 MG PO TABS: 1.0000 | ORAL_TABLET | ORAL | 0 refills | Status: DC | PRN

## 2018-04-17 NOTE — Discharge Summary (Signed)
Obstetric Discharge Summary Reason for Admission: cesarean section and rupture of membranes Prenatal Procedures: ultrasound Intrapartum Procedures: cesarean: low cervical, transverse with bilateral salpingectomy Postpartum Procedures: none Complications-Operative and Postpartum: none Hemoglobin  Date Value Ref Range Status  04/16/2018 11.6 (L) 12.0 - 15.0 g/dL Final   HCT  Date Value Ref Range Status  04/16/2018 34.6 (L) 36.0 - 46.0 % Final    Physical Exam:  General: alert and cooperative Lochia: appropriate Uterine Fundus: firm Incision: healing well, no significant drainage DVT Evaluation: No evidence of DVT seen on physical exam.  Discharge Diagnoses: Term Pregnancy-delivered  Discharge Information: Date: 04/17/2018 Activity: pelvic rest Diet: routine Medications: PNV, Ibuprofen and Percocet Condition: stable Instructions: refer to practice specific booklet Discharge to: home   Newborn Data: Live born female  Birth Weight: 8 lb 9 oz (3885 g) APGAR: 10, 10  Newborn Delivery   Birth date/time:  04/15/2018 19:57:00 Delivery type:  C-Section, Low Transverse Trial of labor:  No C-section categorization:  Repeat     Home with mother.  Philip Aspen 04/17/2018, 5:28 PM

## 2018-04-17 NOTE — Lactation Note (Signed)
This note was copied from a baby's chart. Lactation Consultation Note  Patient Name: Katie Glass [id number] Date: 04/17/2018 Reason for consult: Follow-up assessment;Term  P2 mother whose infant is now 62 hours old.  Mother breast fed her first child for 8 months.  She will be discharged this afternoon.  Baby was sleeping in bassinet when I arrived.  He had a circumcision this a.m.  Mother had requested a NS.  Fitted her with a #20 for today but explained she may need a #24 when milk comes to volume.  Demonstrated placement and mother did a return demonstration.  She attempted to latch baby onto the right breast but he was too sleepy.  Placed him STS and he fell asleep.  Engorgement prevention/treatment reviewed.  Manual pump at bedside and, after demonstration, I determined that mother would be more comfortable with a #27 flange size.  She knows to use coconut oil for lubrication if needed.  Comfort gels with instructions given.  Mother has our OP phone number for questions/concerns after discharge.  RN updated.    Maternal Data Formula Feeding for Exclusion: No Has patient been taught Hand Expression?: Yes Does the patient have breastfeeding experience prior to this delivery?: Yes  Feeding Feeding Type: Bottle Fed - Formula  LATCH Score                   Interventions    Lactation Tools Discussed/Used     Consult Status Consult Status: Complete Date: 04/17/18 Follow-up type: Call as needed    Irene Pap [place] 04/17/2018, 2:55 PM

## 2018-04-18 ENCOUNTER — Encounter (HOSPITAL_COMMUNITY): Payer: Self-pay | Admitting: Obstetrics and Gynecology

## 2018-04-18 DIAGNOSIS — G4733 Obstructive sleep apnea (adult) (pediatric): Secondary | ICD-10-CM | POA: Diagnosis not present

## 2018-04-18 NOTE — Anesthesia Postprocedure Evaluation (Signed)
Anesthesia Post Note  Patient: Katie Glass  Procedure(s) Performed: CESAREAN SECTION (N/A Abdomen) BILATERAL SALPINGECTOMY (Bilateral )     Patient location during evaluation: PACU Anesthesia Type: Spinal Level of consciousness: oriented and awake and alert Pain management: pain level controlled Vital Signs Assessment: post-procedure vital signs reviewed and stable Respiratory status: spontaneous breathing, respiratory function stable and patient connected to nasal cannula oxygen Cardiovascular status: blood pressure returned to baseline and stable Postop Assessment: no headache, no backache and no apparent nausea or vomiting Anesthetic complications: no    Last Vitals:  Vitals:   04/16/18 2151 04/17/18 0523  BP: 139/68 138/85  Pulse: 81 73  Resp: 20 18  Temp: 36.7 C 36.9 C  SpO2:  99%    Last Pain:  Vitals:   04/17/18 1400  TempSrc:   PainSc: 2    Pain Goal: Patients Stated Pain Goal: 1 (04/17/18 1400)                 Chelsey L Woodrum

## 2018-05-19 DIAGNOSIS — G4733 Obstructive sleep apnea (adult) (pediatric): Secondary | ICD-10-CM | POA: Diagnosis not present

## 2018-05-25 DIAGNOSIS — Z369 Encounter for antenatal screening, unspecified: Secondary | ICD-10-CM | POA: Diagnosis not present

## 2018-06-17 DIAGNOSIS — G4733 Obstructive sleep apnea (adult) (pediatric): Secondary | ICD-10-CM | POA: Diagnosis not present

## 2018-07-12 DIAGNOSIS — Z6841 Body Mass Index (BMI) 40.0 and over, adult: Secondary | ICD-10-CM | POA: Diagnosis not present

## 2018-07-12 DIAGNOSIS — N920 Excessive and frequent menstruation with regular cycle: Secondary | ICD-10-CM | POA: Diagnosis not present

## 2018-07-18 DIAGNOSIS — G4733 Obstructive sleep apnea (adult) (pediatric): Secondary | ICD-10-CM | POA: Diagnosis not present

## 2018-08-17 DIAGNOSIS — G4733 Obstructive sleep apnea (adult) (pediatric): Secondary | ICD-10-CM | POA: Diagnosis not present

## 2018-09-20 DIAGNOSIS — M25571 Pain in right ankle and joints of right foot: Secondary | ICD-10-CM | POA: Diagnosis not present

## 2018-09-20 DIAGNOSIS — M7671 Peroneal tendinitis, right leg: Secondary | ICD-10-CM | POA: Diagnosis not present

## 2018-12-31 DIAGNOSIS — G4733 Obstructive sleep apnea (adult) (pediatric): Secondary | ICD-10-CM | POA: Diagnosis not present

## 2019-03-10 DIAGNOSIS — G4733 Obstructive sleep apnea (adult) (pediatric): Secondary | ICD-10-CM | POA: Diagnosis not present

## 2019-03-29 DIAGNOSIS — K429 Umbilical hernia without obstruction or gangrene: Secondary | ICD-10-CM | POA: Diagnosis not present

## 2019-03-29 DIAGNOSIS — R1032 Left lower quadrant pain: Secondary | ICD-10-CM | POA: Diagnosis not present

## 2019-05-16 ENCOUNTER — Ambulatory Visit (INDEPENDENT_AMBULATORY_CARE_PROVIDER_SITE_OTHER): Payer: Self-pay | Admitting: Bariatrics

## 2019-05-17 ENCOUNTER — Other Ambulatory Visit: Payer: Self-pay

## 2019-05-17 ENCOUNTER — Encounter (INDEPENDENT_AMBULATORY_CARE_PROVIDER_SITE_OTHER): Payer: Self-pay | Admitting: Family Medicine

## 2019-05-17 ENCOUNTER — Ambulatory Visit (INDEPENDENT_AMBULATORY_CARE_PROVIDER_SITE_OTHER): Payer: BC Managed Care – PPO | Admitting: Family Medicine

## 2019-05-17 VITALS — BP 121/79 | HR 71 | Temp 98.4°F | Ht 66.0 in | Wt 268.0 lb

## 2019-05-17 DIAGNOSIS — F3289 Other specified depressive episodes: Secondary | ICD-10-CM | POA: Diagnosis not present

## 2019-05-17 DIAGNOSIS — G4733 Obstructive sleep apnea (adult) (pediatric): Secondary | ICD-10-CM | POA: Diagnosis not present

## 2019-05-17 DIAGNOSIS — R5383 Other fatigue: Secondary | ICD-10-CM | POA: Diagnosis not present

## 2019-05-17 DIAGNOSIS — R0602 Shortness of breath: Secondary | ICD-10-CM

## 2019-05-17 DIAGNOSIS — Z9989 Dependence on other enabling machines and devices: Secondary | ICD-10-CM

## 2019-05-17 DIAGNOSIS — Z9189 Other specified personal risk factors, not elsewhere classified: Secondary | ICD-10-CM

## 2019-05-17 DIAGNOSIS — Z0289 Encounter for other administrative examinations: Secondary | ICD-10-CM

## 2019-05-17 DIAGNOSIS — N920 Excessive and frequent menstruation with regular cycle: Secondary | ICD-10-CM | POA: Diagnosis not present

## 2019-05-17 DIAGNOSIS — Z6841 Body Mass Index (BMI) 40.0 and over, adult: Secondary | ICD-10-CM

## 2019-05-17 NOTE — Telephone Encounter (Signed)
Please advise 

## 2019-05-17 NOTE — Progress Notes (Signed)
Chief Complaint:   OBESITY Katie Glass (MR# [id number]) is a 44 y.o. female who presents for evaluation and treatment of obesity and related comorbidities. Current BMI is Body mass index is 43.26 kg/m. Katie Glass has been struggling with her weight for many years and has been unsuccessful in either losing weight, maintaining weight loss, or reaching her healthy weight goal.  Katie Glass is currently in the action stage of change and ready to dedicate time achieving and maintaining a healthier weight. Katie Glass is interested in becoming our patient and working on intensive lifestyle modifications including (but not limited to) diet and exercise for weight loss.  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  She has her undergrad in exercise physiology.  Katie Glass's habits were reviewed today and are as follows: Her family eats meals together, her desired weight loss is 68 pounds, she has been heavy most of her life, she started gaining weight after pregnancy, her heaviest weight ever was 270 pounds, she is somewhat of a picky eater, she craves carbs and sweets, she snacks frequently in the evenings, she is frequently drinking liquids with calories, she sometimes makes poor food choices, she frequently eats larger portions than normal and she struggles with emotional eating.  Depression Screen Katie Glass's Food and Mood (modified PHQ-9) score was 7.  Depression screen Katie Glass 2/9 05/17/2019  Decreased Interest 1  Down, Depressed, Hopeless 1  PHQ - 2 Score 2  Altered sleeping 1  Tired, decreased energy 2  Change in appetite 1  Feeling bad or failure about yourself  1  Trouble concentrating 0  Moving slowly or fidgety/restless 0  Suicidal thoughts 0  PHQ-9 Score 7  Difficult doing work/chores Not difficult at all   Subjective:   1. Other fatigue Katie Glass admits to daytime somnolence and denies waking up still tired.  Patent has a history of symptoms of daytime fatigue. Katie Glass generally gets 6 or 7 hours of sleep per night, and states that she has generally restful sleep. Snoring is present. Apneic episodes are not present. Epworth Sleepiness Score is 16.  2. SOB (shortness of breath) on exertion Katie Glass notes increasing shortness of breath with exercising and seems to be worsening over time with weight gain. She notes getting out of breath sooner with activity than she used to. This has not gotten worse recently. Katie Glass denies shortness of breath at rest or orthopnea.  3. OSA on CPAP Katie Glass has a diagnosis of sleep apnea. She reports that she is using a CPAP regularly.  She has a 51-year-old who is not sleeping through the night.  Epworth score is 16 today.  4. Menorrhagia with regular cycle Katie Glass says Katie Glass helps to decrease her flow.  5. Other depression, with emotional eating Katie Glass is struggling with emotional eating and using food for comfort to the extent that it is negatively impacting her health. She has been working on behavior modification techniques to help reduce her emotional eating and has been unsuccessful. She shows no sign of suicidal or homicidal ideations.  Katie Glass eats when she is stressed, sad, bored, to stay awake, and as a reward.  PHQ-9 is 7 today.  6. At risk for deficient intake of food The patient is at a higher than average risk of deficient intake of food.  Assessment/Plan:   1. Other fatigue Carry does feel that her weight is causing her energy to be lower than  it should be. Fatigue may be related to obesity, depression or many other causes. Labs will be ordered, and in the meanwhile, Katie Glass will focus on self care including making healthy food choices, increasing physical activity and focusing on stress reduction.  Orders - EKG 12-Lead - Hemoglobin A1c - Insulin, random - VITAMIN D 25 Hydroxy (Vit-D Deficiency, Fractures) - TSH - T4, free - T3  2. SOB  (shortness of breath) on exertion Katie Glass does not feel that she gets out of breath more easily that she used to when she exercises. Katie Glass's shortness of breath appears to be obesity related and exercise induced. She has agreed to work on weight loss and gradually increase exercise to treat her exercise induced shortness of breath. Will continue to monitor closely.  Orders - Comprehensive metabolic panel - Lipid panel  3. OSA on CPAP Intensive lifestyle modifications are the first line treatment for this issue. We discussed several lifestyle modifications today and she will continue to work on diet, exercise and weight loss efforts. We will continue to monitor. Orders and follow up as documented in patient record.   Counseling  Sleep apnea is a condition in which breathing pauses or becomes shallow during sleep. This happens over and over during the night. This disrupts your sleep and keeps your body from getting the rest that it needs, which can cause tiredness and lack of energy (fatigue) during the day.  Sleep apnea treatment: If you were given a device to open your airway while you sleep, USE IT!  Sleep hygiene:   Limit or avoid alcohol, caffeinated beverages, and cigarettes, especially close to bedtime.   Do not eat a large meal or eat spicy foods right before bedtime. This can lead to digestive discomfort that can make it hard for you to sleep.  Keep a sleep diary to help you and your health care provider figure out what could be causing your insomnia.  . Make your bedroom a dark, comfortable place where it is easy to fall asleep. ? Put up shades or blackout curtains to block light from outside. ? Use a white noise machine to block noise. ? Keep the temperature cool. . Limit screen use before bedtime. This includes: ? Watching TV. ? Using your smartphone, tablet, or computer. . Stick to a routine that includes going to bed and waking up at the same times every day and night.  This can help you fall asleep faster. Consider making a quiet activity, such as reading, part of your nighttime routine. . Try to avoid taking naps during the day so that you sleep better at night. . Get out of bed if you are still awake after 15 minutes of trying to sleep. Keep the lights down, but try reading or doing a quiet activity. When you feel sleepy, go back to bed.  4. Menorrhagia with regular cycle Will check labs today, as below.  Orders - CBC with Differential/Platelet - Anemia panel  5. Other depression, with emotional eating Behavior modification techniques were discussed today to help Keryn deal with her emotional/non-hunger eating behaviors.  Orders and follow up as documented in patient record.   6. At risk for deficient intake of food Katie Glass was given approximately 15 minutes of deficit intake of food prevention counseling today. Katie Glass is at risk for eating too few calories based on current food recall. She was encouraged to focus on meeting caloric and protein goals according to her recommended meal plan.   7. Class 3 severe obesity  with serious comorbidity and body mass index (BMI) of 40.0 to 44.9 in adult, unspecified obesity type [facility]) Katie Glass is currently in the action stage of change and her goal is to continue with weight loss efforts. I recommend Katie Glass begin the structured treatment plan as follows:  She has agreed to the Category 4 Plan.  Exercise goals: As is.   Behavioral modification strategies: increasing lean protein intake, decreasing simple carbohydrates, increasing vegetables, increasing water intake, decreasing liquid calories and meal planning and cooking strategies.  She was informed of the importance of frequent follow-up visits to maximize her success with intensive lifestyle modifications for her multiple health conditions. She was informed we would discuss her lab results at her next visit unless there is a critical issue that needs to be  addressed sooner. Katie Glass agreed to keep her next visit at the agreed upon time to discuss these results.  Objective:   Blood pressure 121/79, pulse 71, temperature 98.4 F (36.9 C), temperature source Oral, height 5\' 6"  (1.676 m), weight 268 lb (121.6 kg), last menstrual period 04/24/2019, SpO2 97 %, unknown if currently breastfeeding. Body mass index is 43.26 kg/m.  EKG: Normal sinus rhythm, rate 81 bpm.  Indirect Calorimeter completed today shows a VO2 of 414 and a REE of 2880.  Her calculated basal metabolic rate is 1610 thus her basal metabolic rate is better than expected.  General: Cooperative, alert, well developed, in no acute distress. HEENT: Conjunctivae and lids unremarkable. Cardiovascular: Regular rhythm.  Lungs: Normal work of breathing. Neurologic: No focal deficits.   Lab Results  Component Value Date   CREATININE 0.64 11/11/2010   BUN 8 11/11/2010   NA 136 11/11/2010   K 3.7 11/11/2010   CL 100 11/11/2010   CO2 21 11/11/2010   Lab Results  Component Value Date   ALT 14 11/11/2010   AST 17 11/11/2010   ALKPHOS 96 11/11/2010   BILITOT 0.5 11/11/2010   Lab Results  Component Value Date   WBC 13.8 (H) 04/16/2018   HGB 11.6 (L) 04/16/2018   HCT 34.6 (L) 04/16/2018   MCV 95.6 04/16/2018   PLT 198 04/16/2018   Attestation Statements:   This is the patient's first visit at [facility]. The patient's NEW PATIENT PACKET was reviewed at length. Included in the packet: current and past health history, medications, allergies, ROS, gynecologic history (women only), surgical history, family history, social history, weight history, weight loss surgery history (for those that have had weight loss surgery), nutritional evaluation, mood and food questionnaire, PHQ9, Epworth questionnaire, sleep habits questionnaire, patient life and health improvement goals questionnaire. These will all be scanned into the patient's chart under media.   During the visit, I  independently reviewed the patient's EKG, bioimpedance scale results, and indirect calorimeter results. I used this information to tailor a meal plan for the patient that will help her to lose weight and will improve her obesity-related conditions going forward. I performed a medically necessary appropriate examination and/or evaluation. I discussed the assessment and treatment plan with the patient. The patient was provided an opportunity to ask questions and all were answered. The patient agreed with the plan and demonstrated an understanding of the instructions. Labs were ordered at this visit and will be reviewed at the next visit unless more critical results need to be addressed immediately. Clinical information was updated and documented in the EMR.   I, Water quality scientist, CMA, am acting as Location manager for [employer], DO.  I have reviewed  the above documentation for accuracy and completeness, and I agree with the above. Helane Rima, DO

## 2019-05-18 ENCOUNTER — Ambulatory Visit (INDEPENDENT_AMBULATORY_CARE_PROVIDER_SITE_OTHER): Payer: 59 | Admitting: Psychology

## 2019-05-18 DIAGNOSIS — F4323 Adjustment disorder with mixed anxiety and depressed mood: Secondary | ICD-10-CM | POA: Diagnosis not present

## 2019-05-18 LAB — LIPID PANEL
Chol/HDL Ratio: 3.3 ratio (ref 0.0–4.4)
Cholesterol, Total: 183 mg/dL (ref 100–199)
HDL: 56 mg/dL (ref >39)
LDL Chol Calc (NIH): 106 mg/dL — ABNORMAL HIGH (ref 0–99)
Triglycerides: 116 mg/dL (ref 0–149)
VLDL Cholesterol Cal: 21 mg/dL (ref 5–40)

## 2019-05-18 LAB — ANEMIA PANEL
Ferritin: 70 ng/mL (ref 15–150)
Folate, Hemolysate: 457 ng/mL
Folate, RBC: 1065 ng/mL (ref >498)
Hematocrit: 42.9 % (ref 34.0–46.6)
Iron Saturation: 21 % (ref 15–55)
Iron: 66 ug/dL (ref 27–159)
Retic Ct Pct: 1.8 % (ref 0.6–2.6)
Total Iron Binding Capacity: 310 ug/dL (ref 250–450)
UIBC: 244 ug/dL (ref 131–425)
Vitamin B-12: 348 pg/mL (ref 232–1245)

## 2019-05-18 LAB — COMPREHENSIVE METABOLIC PANEL
ALT: 22 IU/L (ref 0–32)
AST: 18 IU/L (ref 0–40)
Albumin/Globulin Ratio: 1.7 (ref 1.2–2.2)
Albumin: 4.4 g/dL (ref 3.8–4.8)
Alkaline Phosphatase: 47 IU/L (ref 39–117)
BUN/Creatinine Ratio: 14 (ref 9–23)
BUN: 13 mg/dL (ref 6–24)
Bilirubin Total: 0.4 mg/dL (ref 0.0–1.2)
CO2: 19 mmol/L — ABNORMAL LOW (ref 20–29)
Calcium: 9.2 mg/dL (ref 8.7–10.2)
Chloride: 104 mmol/L (ref 96–106)
Creatinine, Ser: 0.94 mg/dL (ref 0.57–1.00)
GFR calc Af Amer: 86 mL/min/{1.73_m2} (ref >59)
GFR calc non Af Amer: 75 mL/min/{1.73_m2} (ref >59)
Globulin, Total: 2.6 g/dL (ref 1.5–4.5)
Glucose: 101 mg/dL — ABNORMAL HIGH (ref 65–99)
Potassium: 4.4 mmol/L (ref 3.5–5.2)
Sodium: 139 mmol/L (ref 134–144)
Total Protein: 7 g/dL (ref 6.0–8.5)

## 2019-05-18 LAB — CBC WITH DIFFERENTIAL/PLATELET
Basophils Absolute: 0 10*3/uL (ref 0.0–0.2)
Basos: 0 %
EOS (ABSOLUTE): 0.2 10*3/uL (ref 0.0–0.4)
Eos: 2 %
Hemoglobin: 14.7 g/dL (ref 11.1–15.9)
Immature Grans (Abs): 0 10*3/uL (ref 0.0–0.1)
Immature Granulocytes: 0 %
Lymphocytes Absolute: 1.5 10*3/uL (ref 0.7–3.1)
Lymphs: 19 %
MCH: 31.7 pg (ref 26.6–33.0)
MCHC: 34.3 g/dL (ref 31.5–35.7)
MCV: 93 fL (ref 79–97)
Monocytes Absolute: 0.7 10*3/uL (ref 0.1–0.9)
Monocytes: 8 %
Neutrophils Absolute: 5.6 10*3/uL (ref 1.4–7.0)
Neutrophils: 71 %
Platelets: 242 10*3/uL (ref 150–450)
RBC: 4.63 x10E6/uL (ref 3.77–5.28)
RDW: 12.4 % (ref 11.7–15.4)
WBC: 8 10*3/uL (ref 3.4–10.8)

## 2019-05-18 LAB — INSULIN, RANDOM: INSULIN: 13.2 u[IU]/mL (ref 2.6–24.9)

## 2019-05-18 LAB — TSH: TSH: 1.35 u[IU]/mL (ref 0.450–4.500)

## 2019-05-18 LAB — T3: T3, Total: 111 ng/dL (ref 71–180)

## 2019-05-18 LAB — HEMOGLOBIN A1C
Est. average glucose Bld gHb Est-mCnc: 108 mg/dL
Hgb A1c MFr Bld: 5.4 % (ref 4.8–5.6)

## 2019-05-18 LAB — VITAMIN D 25 HYDROXY (VIT D DEFICIENCY, FRACTURES): Vit D, 25-Hydroxy: 22.9 ng/mL — ABNORMAL LOW (ref 30.0–100.0)

## 2019-05-18 LAB — T4, FREE: Free T4: 1.12 ng/dL (ref 0.82–1.77)

## 2019-05-30 ENCOUNTER — Ambulatory Visit (INDEPENDENT_AMBULATORY_CARE_PROVIDER_SITE_OTHER): Payer: Self-pay | Admitting: Bariatrics

## 2019-05-31 ENCOUNTER — Other Ambulatory Visit: Payer: Self-pay

## 2019-05-31 ENCOUNTER — Ambulatory Visit (INDEPENDENT_AMBULATORY_CARE_PROVIDER_SITE_OTHER): Payer: BC Managed Care – PPO | Admitting: Family Medicine

## 2019-05-31 ENCOUNTER — Encounter (INDEPENDENT_AMBULATORY_CARE_PROVIDER_SITE_OTHER): Payer: Self-pay | Admitting: Family Medicine

## 2019-05-31 VITALS — BP 129/77 | HR 75 | Temp 98.0°F | Ht 66.0 in | Wt 260.0 lb

## 2019-05-31 DIAGNOSIS — Z9989 Dependence on other enabling machines and devices: Secondary | ICD-10-CM

## 2019-05-31 DIAGNOSIS — E559 Vitamin D deficiency, unspecified: Secondary | ICD-10-CM

## 2019-05-31 DIAGNOSIS — Z9189 Other specified personal risk factors, not elsewhere classified: Secondary | ICD-10-CM

## 2019-05-31 DIAGNOSIS — G4733 Obstructive sleep apnea (adult) (pediatric): Secondary | ICD-10-CM | POA: Diagnosis not present

## 2019-05-31 DIAGNOSIS — E8881 Metabolic syndrome: Secondary | ICD-10-CM | POA: Diagnosis not present

## 2019-05-31 DIAGNOSIS — E88819 Insulin resistance, unspecified: Secondary | ICD-10-CM

## 2019-05-31 DIAGNOSIS — Z6841 Body Mass Index (BMI) 40.0 and over, adult: Secondary | ICD-10-CM

## 2019-05-31 MED ORDER — VITAMIN D (ERGOCALCIFEROL) 1.25 MG (50000 UNIT) PO CAPS: 50000.0000 [IU] | ORAL_CAPSULE | ORAL | 0 refills | Status: DC

## 2019-05-31 NOTE — Progress Notes (Signed)
Chief Complaint:   OBESITY Katie Glass is here to discuss her progress with her obesity treatment plan along with follow-up of her obesity related diagnoses. Katie Glass is on the Category 4 Plan and states she is following her eating plan approximately 98.8% of the time. Katie Glass states she is doing cardio and full body exercises for 60 minutes 5 times per week.  Today's visit was #: 2 Starting weight: 268 lbs Starting date: 05/17/2019 Today's weight: 260 lbs Today's date: 05/31/2019 Total lbs lost to date: 8 lbs Total lbs lost since last in-office visit: 8 lbs  Interim History: Katie Glass is doing very well on the plan.  She is meeting her calorie and protein goals most days (Lose It app info reviewed together).  She says she is exercising regularly.  She would like to start using machines but she is not sure what to do.  She says she will be trying "Freshly" for lunches (400-600 calories, 35+ grams of protein).   Subjective:   1. Vitamin D deficiency Katie Glass's Vitamin D level was 22.9 on 05/17/2019. She is not currently taking vit D. She denies nausea, vomiting or muscle weakness.  2. Insulin resistance Katie Glass has a diagnosis of insulin resistance based on her elevated fasting insulin level >5. She continues to work on diet and exercise to decrease her risk of diabetes.  Lab Results  Component Value Date   INSULIN 13.2 05/17/2019   Lab Results  Component Value Date   HGBA1C 5.4 05/17/2019   3. OSA on CPAP Katie Glass has a diagnosis of sleep apnea. She reports that she is using a CPAP regularly.   Assessment/Plan:   1. Vitamin D deficiency Low Vitamin D level contributes to fatigue and are associated with obesity, breast, and colon cancer. She agrees to start to take prescription Vitamin D @50 ,000 IU every week and will follow-up for routine testing of Vitamin D, at least 2-3 times per year to avoid over-replacement.  Orders - Vitamin D, Ergocalciferol, (DRISDOL) 1.25 MG (50000 UNIT)  CAPS capsule; Take 1 capsule (50,000 Units total) by mouth every 7 (seven) days.  Dispense: 4 capsule; Refill: 0  2. Insulin resistance Katie Glass will continue to work on weight loss, exercise, and decreasing simple carbohydrates to help decrease the risk of diabetes. Katie Glass agreed to follow-up with Katie Glass as directed to closely monitor her progress.  3. OSA on CPAP Intensive lifestyle modifications are the first line treatment for this issue. We discussed several lifestyle modifications today and she will continue to work on diet, exercise and weight loss efforts. We will continue to monitor. Orders and follow up as documented in patient record.   Counseling  Sleep apnea is a condition in which breathing pauses or becomes shallow during sleep. This happens over and over during the night. This disrupts your sleep and keeps your body from getting the rest that it needs, which can cause tiredness and lack of energy (fatigue) during the day.  Sleep apnea treatment: If you were given a device to open your airway while you sleep, USE IT!  Sleep hygiene:   Limit or avoid alcohol, caffeinated beverages, and cigarettes, especially close to bedtime.   Do not eat a large meal or eat spicy foods right before bedtime. This can lead to digestive discomfort that can make it hard for you to sleep.  Keep a sleep diary to help you and your health care provider figure out what could be causing your insomnia.  . Make your bedroom a dark,  comfortable place where it is easy to fall asleep. ? Put up shades or blackout curtains to block light from outside. ? Use a white noise machine to block noise. ? Keep the temperature cool. . Limit screen use before bedtime. This includes: ? Watching TV. ? Using your smartphone, tablet, or computer. . Stick to a routine that includes going to bed and waking up at the same times every day and night. This can help you fall asleep faster. Consider making a quiet activity, such as  reading, part of your nighttime routine. . Try to avoid taking naps during the day so that you sleep better at night. . Get out of bed if you are still awake after 15 minutes of trying to sleep. Keep the lights down, but try reading or doing a quiet activity. When you feel sleepy, go back to bed.  4. At risk for constipation Katie Glass was informed that a decrease in bowel movement frequency is normal while losing weight, but stools should not be hard or painful.   Counseling Getting to Good Bowel Health: Your goal is to have one soft bowel movement each day. Drink at least 8 glasses of water each day. Eat plenty of fiber (goal is over 25 grams each day). It is best to get most of your fiber from dietary sources which includes leafy green vegetables, fresh fruit, and whole grains. You may need to add fiber with the help of OTC fiber supplements. These include Metamucil, Citrucel, and Flaxseed. If you are still having trouble, try adding Miralax or Magnesium Citrate. If all of these changes do not work, Dietitian.  5. Class 3 severe obesity with serious comorbidity and body mass index (BMI) of 40.0 to 44.9 in adult, unspecified obesity type [facility]) Katie Glass is currently in the action stage of change. As such, her goal is to continue with weight loss efforts. She has agreed to the Category 4 Plan.   Exercise goals: As is.  Behavioral modification strategies: increasing lean protein intake and increasing water intake.  Katie Glass has agreed to follow-up with our clinic in 2 weeks. She was informed of the importance of frequent follow-up visits to maximize her success with intensive lifestyle modifications for her multiple health conditions.   Objective:   Blood pressure 129/77, pulse 75, temperature 98 F (36.7 C), temperature source Oral, height 5\' 6"  (1.676 m), weight 260 lb (117.9 kg), last menstrual period 05/24/2019, SpO2 96 %, unknown if currently breastfeeding. Body mass index is 41.97  kg/m.  General: Cooperative, alert, well developed, in no acute distress. HEENT: Conjunctivae and lids unremarkable. Cardiovascular: Regular rhythm.  Lungs: Normal work of breathing. Neurologic: No focal deficits.   Lab Results  Component Value Date   CREATININE 0.94 05/17/2019   BUN 13 05/17/2019   NA 139 05/17/2019   K 4.4 05/17/2019   CL 104 05/17/2019   CO2 19 (L) 05/17/2019   Lab Results  Component Value Date   ALT 22 05/17/2019   AST 18 05/17/2019   ALKPHOS 47 05/17/2019   BILITOT 0.4 05/17/2019   Lab Results  Component Value Date   HGBA1C 5.4 05/17/2019   Lab Results  Component Value Date   INSULIN 13.2 05/17/2019   Lab Results  Component Value Date   TSH 1.350 05/17/2019   Lab Results  Component Value Date   CHOL 183 05/17/2019   HDL 56 05/17/2019   LDLCALC 106 (H) 05/17/2019   TRIG 116 05/17/2019   CHOLHDL 3.3 05/17/2019  Lab Results  Component Value Date   WBC 8.0 05/17/2019   HGB 14.7 05/17/2019   HCT 42.9 05/17/2019   MCV 93 05/17/2019   PLT 242 05/17/2019   Lab Results  Component Value Date   IRON 66 05/17/2019   TIBC 310 05/17/2019   FERRITIN 70 05/17/2019   Attestation Statements:   Reviewed by clinician on day of visit: allergies, medications, problem list, medical history, surgical history, family history, social history, and previous encounter notes.  I, Water quality scientist, CMA, am acting as Location manager for [employer], DO.  I have reviewed the above documentation for accuracy and completeness, and I agree with the above. Briscoe Deutscher, DO

## 2019-06-06 ENCOUNTER — Ambulatory Visit (INDEPENDENT_AMBULATORY_CARE_PROVIDER_SITE_OTHER): Payer: BC Managed Care – PPO | Admitting: Psychology

## 2019-06-06 DIAGNOSIS — F331 Major depressive disorder, recurrent, moderate: Secondary | ICD-10-CM

## 2019-06-06 DIAGNOSIS — F411 Generalized anxiety disorder: Secondary | ICD-10-CM

## 2019-06-07 DIAGNOSIS — Z6841 Body Mass Index (BMI) 40.0 and over, adult: Secondary | ICD-10-CM | POA: Diagnosis not present

## 2019-06-07 DIAGNOSIS — Z01419 Encounter for gynecological examination (general) (routine) without abnormal findings: Secondary | ICD-10-CM | POA: Diagnosis not present

## 2019-06-15 ENCOUNTER — Encounter (INDEPENDENT_AMBULATORY_CARE_PROVIDER_SITE_OTHER): Payer: Self-pay | Admitting: Family Medicine

## 2019-06-16 NOTE — Telephone Encounter (Signed)
Please advise.  I have a general letter that you can use if you need that.  Thanks

## 2019-06-20 ENCOUNTER — Encounter (INDEPENDENT_AMBULATORY_CARE_PROVIDER_SITE_OTHER): Payer: Self-pay | Admitting: Family Medicine

## 2019-06-20 ENCOUNTER — Ambulatory Visit (INDEPENDENT_AMBULATORY_CARE_PROVIDER_SITE_OTHER): Payer: BC Managed Care – PPO | Admitting: Family Medicine

## 2019-06-20 ENCOUNTER — Other Ambulatory Visit: Payer: Self-pay

## 2019-06-20 VITALS — BP 119/76 | HR 59 | Temp 98.5°F | Ht 66.0 in | Wt 254.0 lb

## 2019-06-20 DIAGNOSIS — E559 Vitamin D deficiency, unspecified: Secondary | ICD-10-CM

## 2019-06-20 DIAGNOSIS — E8881 Metabolic syndrome: Secondary | ICD-10-CM | POA: Diagnosis not present

## 2019-06-20 DIAGNOSIS — G4733 Obstructive sleep apnea (adult) (pediatric): Secondary | ICD-10-CM

## 2019-06-20 DIAGNOSIS — Z9189 Other specified personal risk factors, not elsewhere classified: Secondary | ICD-10-CM

## 2019-06-20 DIAGNOSIS — Z6841 Body Mass Index (BMI) 40.0 and over, adult: Secondary | ICD-10-CM

## 2019-06-20 DIAGNOSIS — Z9989 Dependence on other enabling machines and devices: Secondary | ICD-10-CM

## 2019-06-20 NOTE — Telephone Encounter (Signed)
Please advise. Thanks.  

## 2019-06-20 NOTE — Progress Notes (Signed)
Chief Complaint:   OBESITY Media is here to discuss her progress with her obesity treatment plan along with follow-up of her obesity related diagnoses. Katie Glass is on the Category 4 Plan and states she is following her eating plan approximately 90% of the time. Katie Glass states she is doing cardio and body weight for 30 minutes 5-6 times per week.  Today's visit was #: 3 Starting weight: 268 lbs Starting date: 05/17/2019 Today's weight: 254 lbs Today's date: 06/20/2019 Total lbs lost to date: 14 lbs Total lbs lost since last in-office visit: 6 lbs  Interim History: Katie Glass has been using "Freshly" for lunches (400-600 calories, 35+ grams of protein) but found it to be very expensive so starting to prep her own.  She reports drinking Powerade Zero throughout the day.  She also likes Scientist, forensic [place] sausage ([employer]).  Overall, weight is down 6 pounds, but bioimpedance shows that she is also up 3 pounds of water. Patient is menstruating.    Subjective:   1. Vitamin D deficiency Katie Glass's Vitamin D level was 22.9 on 05/17/2019. She is currently taking prescription vitamin D 50,000 IU each week. She denies nausea, vomiting or muscle weakness.  2. Insulin resistance Katie Glass has a diagnosis of insulin resistance based on her elevated fasting insulin level >5. She continues to work on diet and exercise to decrease her risk of diabetes.  Lab Results  Component Value Date   INSULIN 13.2 05/17/2019   Lab Results  Component Value Date   HGBA1C 5.4 05/17/2019   3. OSA on CPAP Laramie has a diagnosis of sleep apnea. She reports that she is using a CPAP regularly.   Assessment/Plan:   1. Vitamin D deficiency Low Vitamin D level contributes to fatigue and are associated with obesity, breast, and colon cancer. She agrees to continue to take prescription Vitamin D @50 ,000 IU every week and will follow-up for routine testing of Vitamin D, at least 2-3 times per year to avoid  over-replacement.  Orders - Vitamin D, Ergocalciferol, (DRISDOL) 1.25 MG (50000 UNIT) CAPS capsule; Take 1 capsule (50,000 Units total) by mouth every 7 (seven) days.  Dispense: 4 capsule; Refill: 0  2. Insulin resistance Katie Glass will continue to work on weight loss, exercise, and decreasing simple carbohydrates to help decrease the risk of diabetes. Katie Glass agreed to follow-up with [name] as directed to closely monitor her progress.  3. OSA on CPAP Intensive lifestyle modifications are the first line treatment for this issue. We discussed several lifestyle modifications today and she will continue to work on diet, exercise and weight loss efforts. We will continue to monitor. Orders and follow up as documented in patient record.   Counseling  Sleep apnea is a condition in which breathing pauses or becomes shallow during sleep. This happens over and over during the night. This disrupts your sleep and keeps your body from getting the rest that it needs, which can cause tiredness and lack of energy (fatigue) during the day.  Sleep apnea treatment: If you were given a device to open your airway while you sleep, USE IT!  Sleep hygiene:   Limit or avoid alcohol, caffeinated beverages, and cigarettes, especially close to bedtime.   Do not eat a large meal or eat spicy foods right before bedtime. This can lead to digestive discomfort that can make it hard for you to sleep.  Keep a sleep diary to help you and your health care provider figure out what could be causing your insomnia.  [place]  Make your bedroom a dark, comfortable place where it is easy to fall asleep. ? Put up shades or blackout curtains to block light from outside. ? Use a white noise machine to block noise. ? Keep the temperature cool. . Limit screen use before bedtime. This includes: ? Watching TV. ? Using your smartphone, tablet, or computer. . Stick to a routine that includes going to bed and waking up at the same times every day and  night. This can help you fall asleep faster. Consider making a quiet activity, such as reading, part of your nighttime routine. . Try to avoid taking naps during the day so that you sleep better at night. . Get out of bed if you are still awake after 15 minutes of trying to sleep. Keep the lights down, but try reading or doing a quiet activity. When you feel sleepy, go back to bed.  4. At risk for heart disease Katie Glass was given approximately 15 minutes of coronary artery disease prevention counseling today. She is 44 y.o. female and has risk factors for heart disease including obesity. We discussed intensive lifestyle modifications today with an emphasis on specific weight loss instructions and strategies.   Repetitive spaced learning was employed today to elicit superior memory formation and behavioral change.  5. Class 3 severe obesity with serious comorbidity and body mass index (BMI) of 40.0 to 44.9 in adult, unspecified obesity type [name]) Katie Glass is currently in the action stage of change. As such, her goal is to continue with weight loss efforts. She has agreed to the Category 4 Plan.   Exercise goals: As is.  Behavioral modification strategies: increasing lean protein intake and increasing water intake.  Hanne has agreed to follow-up with our clinic in 2 weeks. She was informed of the importance of frequent follow-up visits to maximize her success with intensive lifestyle modifications for her multiple health conditions.   Objective:   Blood pressure 119/76, pulse (!) 59, temperature 98.5 F (36.9 C), temperature source Oral, height 5\' 6"  (1.676 m), weight 254 lb (115.2 kg), last menstrual period 05/24/2019, SpO2 97 %, unknown if currently breastfeeding. Body mass index is 41 kg/m.  General: Cooperative, alert, well developed, in no acute distress. HEENT: Conjunctivae and lids unremarkable. Cardiovascular: Regular rhythm.  Lungs: Normal work of breathing. Neurologic: No focal  deficits.   Lab Results  Component Value Date   CREATININE 0.94 05/17/2019   BUN 13 05/17/2019   NA 139 05/17/2019   K 4.4 05/17/2019   CL 104 05/17/2019   CO2 19 (L) 05/17/2019   Lab Results  Component Value Date   ALT 22 05/17/2019   AST 18 05/17/2019   ALKPHOS 47 05/17/2019   BILITOT 0.4 05/17/2019   Lab Results  Component Value Date   HGBA1C 5.4 05/17/2019   Lab Results  Component Value Date   INSULIN 13.2 05/17/2019   Lab Results  Component Value Date   TSH 1.350 05/17/2019   Lab Results  Component Value Date   CHOL 183 05/17/2019   HDL 56 05/17/2019   LDLCALC 106 (H) 05/17/2019   TRIG 116 05/17/2019   CHOLHDL 3.3 05/17/2019   Lab Results  Component Value Date   WBC 8.0 05/17/2019   HGB 14.7 05/17/2019   HCT 42.9 05/17/2019   MCV 93 05/17/2019   PLT 242 05/17/2019   Lab Results  Component Value Date   IRON 66 05/17/2019   TIBC 310 05/17/2019   FERRITIN 70 05/17/2019   Attestation Statements:  Reviewed by clinician on day of visit: allergies, medications, problem list, medical history, surgical history, family history, social history, and previous encounter notes.  I, Water quality scientist, CMA, am acting as Location manager for [employer], DO.  I have reviewed the above documentation for accuracy and completeness, and I agree with the above. Briscoe Deutscher, DO

## 2019-06-21 MED ORDER — VITAMIN D (ERGOCALCIFEROL) 1.25 MG (50000 UNIT) PO CAPS: 50000.0000 [IU] | ORAL_CAPSULE | ORAL | 0 refills | Status: DC

## 2019-06-22 ENCOUNTER — Ambulatory Visit (INDEPENDENT_AMBULATORY_CARE_PROVIDER_SITE_OTHER): Payer: BC Managed Care – PPO | Admitting: Psychology

## 2019-06-22 DIAGNOSIS — F4323 Adjustment disorder with mixed anxiety and depressed mood: Secondary | ICD-10-CM

## 2019-07-11 ENCOUNTER — Ambulatory Visit (INDEPENDENT_AMBULATORY_CARE_PROVIDER_SITE_OTHER): Payer: BC Managed Care – PPO | Admitting: Family Medicine

## 2019-07-11 ENCOUNTER — Other Ambulatory Visit: Payer: Self-pay

## 2019-07-11 ENCOUNTER — Encounter (INDEPENDENT_AMBULATORY_CARE_PROVIDER_SITE_OTHER): Payer: Self-pay | Admitting: Family Medicine

## 2019-07-11 VITALS — BP 131/80 | HR 74 | Temp 98.3°F | Ht 66.0 in | Wt 250.0 lb

## 2019-07-11 DIAGNOSIS — M79672 Pain in left foot: Secondary | ICD-10-CM

## 2019-07-11 DIAGNOSIS — E559 Vitamin D deficiency, unspecified: Secondary | ICD-10-CM

## 2019-07-11 DIAGNOSIS — Z9189 Other specified personal risk factors, not elsewhere classified: Secondary | ICD-10-CM

## 2019-07-11 DIAGNOSIS — Z6841 Body Mass Index (BMI) 40.0 and over, adult: Secondary | ICD-10-CM

## 2019-07-11 MED ORDER — VITAMIN D (ERGOCALCIFEROL) 1.25 MG (50000 UNIT) PO CAPS: 50000.0000 [IU] | ORAL_CAPSULE | ORAL | 0 refills | Status: DC

## 2019-07-14 ENCOUNTER — Ambulatory Visit (INDEPENDENT_AMBULATORY_CARE_PROVIDER_SITE_OTHER): Payer: BC Managed Care – PPO

## 2019-07-14 ENCOUNTER — Ambulatory Visit: Payer: Self-pay | Admitting: Family Medicine

## 2019-07-14 ENCOUNTER — Other Ambulatory Visit: Payer: Self-pay

## 2019-07-14 ENCOUNTER — Ambulatory Visit: Payer: BC Managed Care – PPO | Admitting: Family Medicine

## 2019-07-14 VITALS — BP 120/78 | HR 83 | Ht 66.0 in

## 2019-07-14 DIAGNOSIS — M79672 Pain in left foot: Secondary | ICD-10-CM | POA: Diagnosis not present

## 2019-07-14 DIAGNOSIS — S93622A Sprain of tarsometatarsal ligament of left foot, initial encounter: Secondary | ICD-10-CM | POA: Diagnosis not present

## 2019-07-14 IMAGING — DX DG FOOT 2V*L*
2 series · 2 of 2 positions shown · non-contrast
Comparison: None.

CLINICAL DATA: Left foot pain.

EXAM:
LEFT FOOT - 2 VIEW

[foot ap]
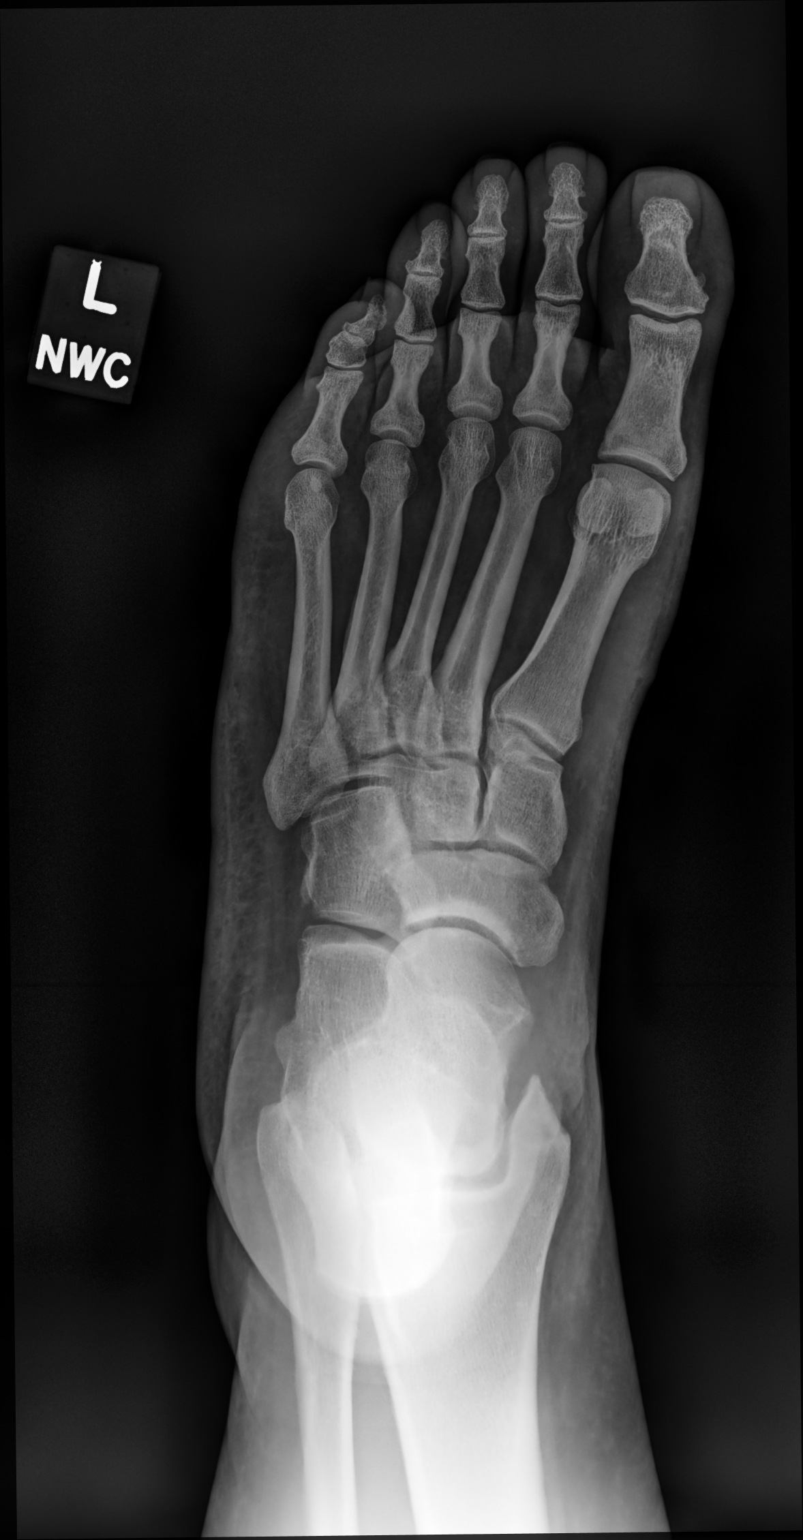

[foot lat]
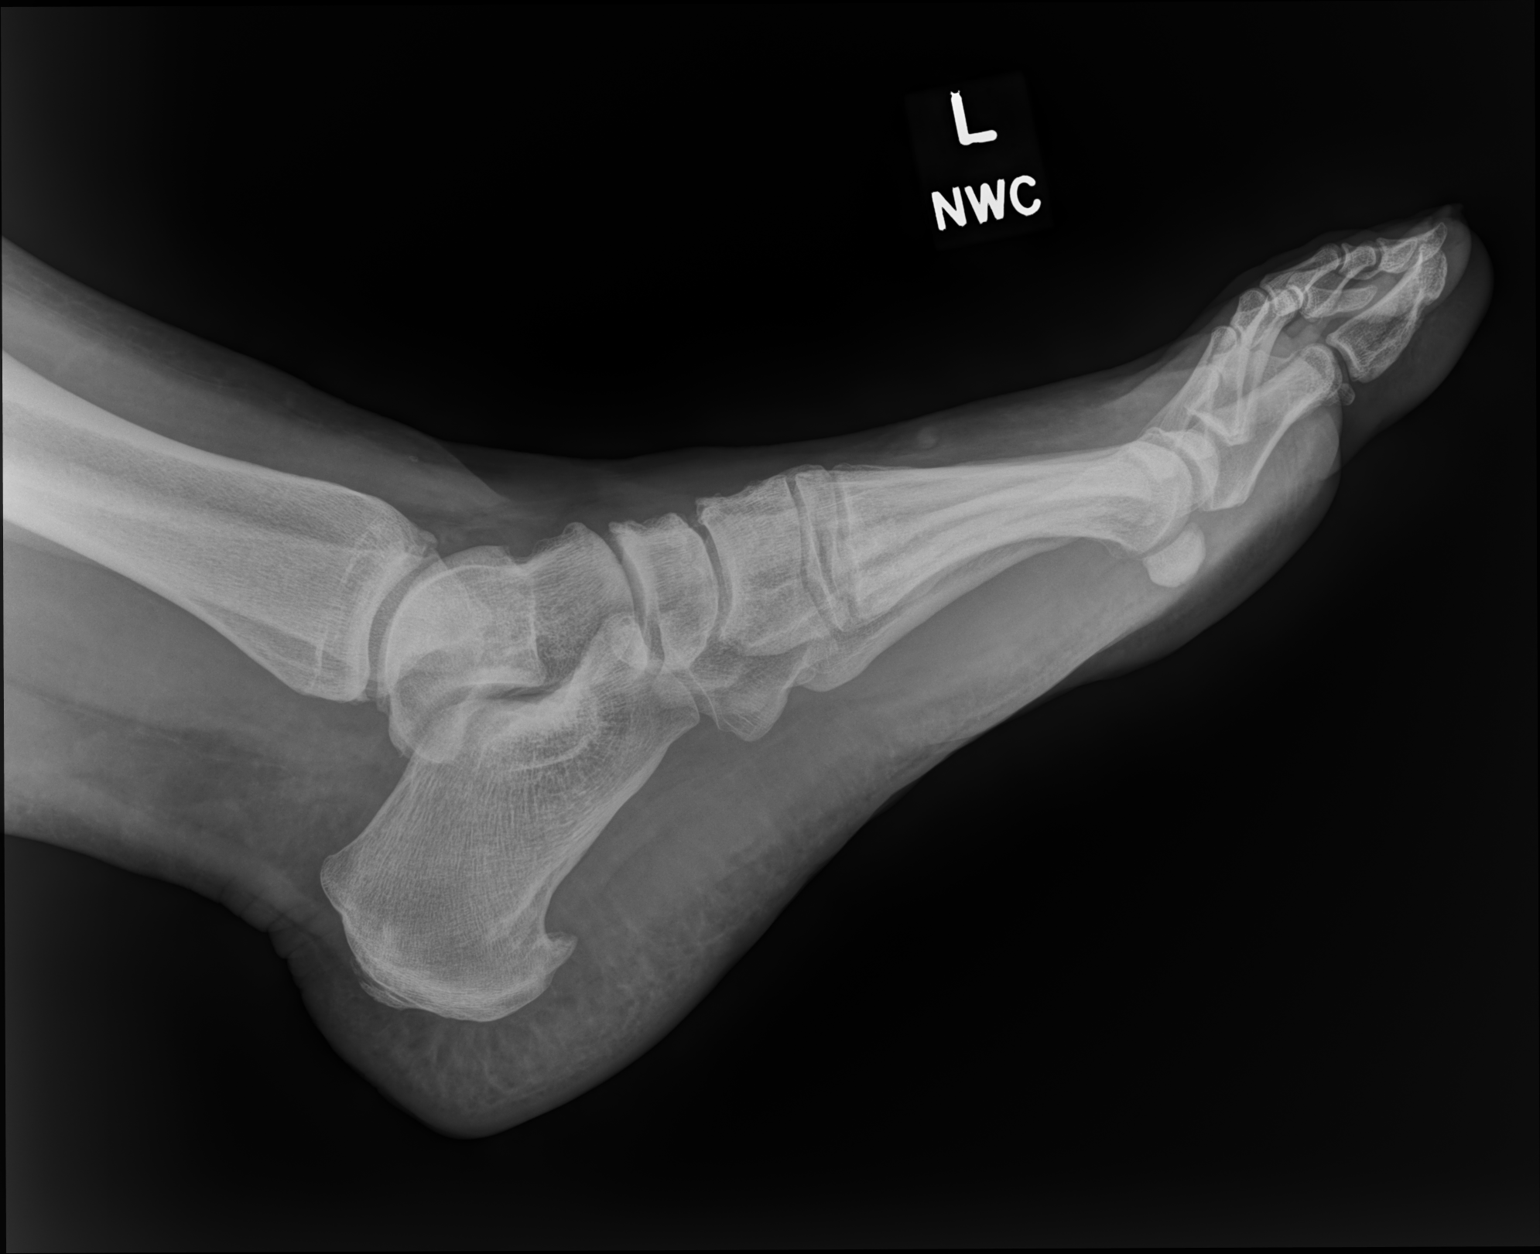

[2 of 2 positions shown; findings below may reference images not displayed]

FINDINGS: There is no evidence of fracture or dislocation. Small calcaneal
spur. No focal bony lesion. No significant degenerative change. Soft
tissues are unremarkable.
IMPRESSION: No acute osseous abnormality in the left foot.

## 2019-07-14 NOTE — Progress Notes (Signed)
[facility] [address] [place] [address] Phone: [phone] Subjective:   Katie Glass, am serving as a scribe for Dr. Antoine Primas. This visit occurred during the SARS-CoV-2 public health emergency.  Safety protocols were in place, including screening questions prior to the visit, additional usage of staff PPE, and extensive cleaning of exam room while observing appropriate contact time as indicated for disinfecting solutions.   I'm seeing this patient by the request  of:  [name], MD  CC: Foot pain   [id number]  Katie Glass is a 44 y.o. female coming in with complaint of bilateral foot pain. 10 years ago tore PF right foot. Has intermittent flares. Patient states that she is having pain in left foot with insidious onset about 4 weeks ago. No pattern to her pain. Continues to be able to work out.  Patient continues to try to be active as much as possible.  Is very motivated to be losing weight at the moment.  Has a 27-month-old son who is very active.  Initial insidious onset may have had some mild swelling and continues to have intermittent swelling.     Past Medical History:  Diagnosis Date  . Arthritis    knees  . Asthma    as a child  . Back pain   . Food allergy    Tannin/sulfate in Wine and ciders, rash on face and hands  . Joint pain   . Lower extremity edema   . MMT (medial meniscus tear)    right  . Sleep apnea    CPAP nightly  . Umbilical hernia    Past Surgical History:  Procedure Laterality Date  . BILATERAL SALPINGECTOMY Bilateral 04/15/2018   Procedure: BILATERAL SALPINGECTOMY;  Surgeon: Carrington Clamp, MD;  Location: [facility]ING SU[facility];  Service: Obstetrics;  Laterality: Bilateral;  . CESAREAN SECTION  11/12/2010   Procedure: CESAREAN SECTION;  Surgeon: Almon Hercules;  Location: [facility] OR[facility];  Service: Gynecology;  Laterality: N/A;  . CESAREAN SECTION N/A 04/15/2018   Procedure: CESAREAN SECTION;  Surgeon:  Carrington Clamp, MD;  Location: [facility]IRTHING SUIT[facility];  Service: Obstetrics;  Laterality: N/A;  . KNEE ARTHROSCOPY Right 02/11/2017   Procedure: RIGHT KNEE ARTHROSCOPY WITH DEBRIDEMENT CHONDROMALACIA;  Surgeon: Gean Birchwood, MD;  Location: [facility];  Service: Orthopedics;  Laterality: Right;   Social History   Socioeconomic History  . Marital status: Married    Spouse name: Ladeana Laplant  . Number of children: Not on file  . Years of education: Not on file  . Highest education level: Not on file  Occupational History  . Occupation: Pastor  Tobacco Use  . Smoking status: Never Smoker  . Smokeless tobacco: Never Used  Substance and Sexual Activity  . Alcohol use: Yes    Comment: social  . Drug use: No  . Sexual activity: Yes    Birth control/protection: None  Other Topics Concern  . Not on file  Social History Narrative  . Not on file   Social Determinants of Health   Financial Resource Strain:   . Difficulty of Paying Living Expenses:   Food Insecurity:   . Worried About Programme researcher, broadcasting/film/video in the Last Year:   . Barista in the Last Year:   Transportation Needs:   . Freight forwarder (Medical):   Marland Kitchen Lack of Transportation (Non-Medical):   Physical Activity:   . Days of Exercise per Week:   . Minutes of  Exercise per Session:   Stress:   . Feeling of Stress :   Social Connections:   . Frequency of Communication with Friends and Family:   . Frequency of Social Gatherings with Friends and Family:   . Attends Religious Services:   . Active Member of Clubs or Organizations:   . Attends Banker Meetings:   Marland Kitchen Marital Status:    No Known Allergies Family History  Problem Relation Age of Onset  . Hypertension Father   . Hyperlipidemia Father   . Heart disease Father   . Cancer Maternal Grandfather   . Heart disease Paternal Grandfather   . Pulmonary embolism Mother   . Alcohol abuse Mother       Current Outpatient  Medications (Respiratory):  .  cetirizine (ZYRTEC) 10 MG tablet, Take 10 mg by mouth daily. .  fluticasone (FLONASE) 50 MCG/ACT nasal spray, Place 1 spray into both nostrils daily.  Current Outpatient Medications (Analgesics):  .  acetaminophen (TYLENOL) 500 MG tablet, Take 500 mg by mouth every 6 (six) hours as needed. Marland Kitchen  ibuprofen (ADVIL) 200 MG tablet, Take 200 mg by mouth every 6 (six) hours as needed.  Current Outpatient Medications (Hematological):  .  tranexamic acid (LYSTEDA) 650 MG TABS tablet, Take 1,300 mg by mouth. Take one tablet by mouth three times a day during menses  Current Outpatient Medications (Other):  Marland Kitchen  Multiple Vitamins-Minerals (WOMENS MULTI VITAMIN & MINERAL PO), Take 1 tablet by mouth daily. .  Vitamin D, Ergocalciferol, (DRISDOL) 1.25 MG (50000 UNIT) CAPS capsule, Take 1 capsule (50,000 Units total) by mouth every 7 (seven) days.   Reviewed prior external information including notes and imaging from  primary care provider As well as notes that were available from care everywhere and other healthcare systems.  Past medical history, social, surgical and family history all reviewed in electronic medical record.  No pertanent information unless stated regarding to the chief complaint.   Review of Systems:  No headache, visual changes, nausea, vomiting, diarrhea, constipation, dizziness, abdominal pain, skin rash, fevers, chills, night sweats, weight loss, swollen lymph nodes, body aches, joint swelling, chest pain, shortness of breath, mood changes. POSITIVE muscle aches  Objective  Blood pressure 120/78, pulse 83, height 5\' 6"  (1.676 m), SpO2 98 %, unknown if currently breastfeeding.   General: No apparent distress alert and oriented x3 mood and affect normal, dressed appropriately.  HEENT: Pupils equal, extraocular movements intact  Respiratory: Patient's speak in full sentences and does not appear short of breath  Cardiovascular: No lower extremity edema,  non tender, no erythema  Neuro: Cranial nerves II through XII are intact, neurovascularly intact in all extremities with 2+ DTRs and 2+ pulses.  Gait normal with good balance and coordination.  MSK: Left ankle exam shows a trace effusion noted of the anterior lateral aspect.  Negative anterior drawer.  Mild tenderness over the talar dome.  Patient has some pain over the midfoot as well.  Neurovascularly intact distally.  Good range of motion of the ankle noted.  Limited musculoskeletal ultrasound was performed and interpreted by  Limited ultrasound of patient's ankle shows that patient may have some cortical irregularity of the lateral malleolus that could correspond with a nonhealing fracture nondisplaced.  Patient also has some hypoechoic changes and what appears to be an effusion of the ankle mortise with some irregularity of the anterior lateral talar dome.  Patient also has significant swelling of the Lisfranc joint.  Questionable loosening of  the ligament but I do not see any gapping on dynamic testing. Impression: Lisfranc sprain with questionable talar dome injury.    Impression and Recommendations:     This case required medical decision making of moderate complexity. The above documentation has been reviewed and is accurate and complete Katie Pulley, DO       Note: This dictation was prepared with Dragon dictation along with smaller phrase technology. Any transcriptional errors that result from this process are unintentional.

## 2019-07-14 NOTE — Patient Instructions (Addendum)
Good to see you.  Pennsaid pinkie amount topically 2 times daily as needed.  K2 daily OTC Ice 20 min X4 times a day  Low impact exercises  Left foot x-ray today See me again in three weeks

## 2019-07-14 NOTE — Progress Notes (Signed)
Chief Complaint:   OBESITY Katie Glass is here to discuss her progress with her obesity treatment plan along with follow-up of her obesity related diagnoses. Katie Glass is on the Category 4 Plan and states Katie Glass is following her eating plan approximately 85% of the time. Katie Glass states Katie Glass is doing boot camp and cardio for 90 minutes 5-6 times per week.  Today's visit was #: 4 Starting weight: 268 lbs Starting date: 05/17/2019 Today's weight: 250 lbs Today's date: 07/11/2019 Total lbs lost to date: 18 lbs Total lbs lost since last in-office visit: 4 lbs  Interim History: Katie Glass says Katie Glass has been overeating lately (cookies and donuts).  Katie Glass is having right foot pain, dorsum, worse after walking with her son on her shoulders for a few miles.  Question stress fracture.  Subjective:   1. Left foot pain Katie Glass has history of right plantar fascia tear and left tennis elbow.  2. Vitamin D deficiency Katie Glass's Vitamin D level was 22.9 on 05/17/2019. Katie Glass is currently taking prescription vitamin D 50,000 IU each week. Katie Glass denies nausea, vomiting or muscle weakness.  Assessment/Plan:   1. Left foot pain Will refer patient to Sports Medicine, as per below.  Orders - Ambulatory referral to Sports Medicine  2. Vitamin D deficiency Low Vitamin D level contributes to fatigue and are associated with obesity, breast, and colon cancer. Katie Glass agrees to continue to take prescription Vitamin D @50 ,000 IU every week and will follow-up for routine testing of Vitamin D, at least 2-3 times per year to avoid over-replacement.  Orders - Vitamin D, Ergocalciferol, (DRISDOL) 1.25 MG (50000 UNIT) CAPS capsule; Take 1 capsule (50,000 Units total) by mouth every 7 (seven) days.  Dispense: 4 capsule; Refill: 0  3. At risk for heart disease Katie Glass was given approximately 15 minutes of coronary artery disease prevention counseling today. Katie Glass is 44 y.o. female and has risk factors for heart disease including obesity.  We discussed intensive lifestyle modifications today with an emphasis on specific weight loss instructions and strategies.   Repetitive spaced learning was employed today to elicit superior memory formation and behavioral change.  4. Class 3 severe obesity with serious comorbidity and body mass index (BMI) of 40.0 to 44.9 in adult, unspecified obesity type Katie Glass) Katie Glass is currently in the action stage of change. As such, her goal is to continue with weight loss efforts. Katie Glass has agreed to the Category 4 Plan.   Exercise goals: For substantial health benefits, adults should do at least 150 minutes (2 hours and 30 minutes) a week of moderate-intensity, or 75 minutes (1 hour and 15 minutes) a week of vigorous-intensity aerobic physical activity, or an equivalent combination of moderate- and vigorous-intensity aerobic activity. Aerobic activity should be performed in episodes of at least 10 minutes, and preferably, it should be spread throughout the week.  Behavioral modification strategies: increasing lean protein intake and emotional eating strategies.  Katie Glass has agreed to follow-up with our clinic in 2 weeks. Katie Glass was informed of the importance of frequent follow-up visits to maximize her success with intensive lifestyle modifications for her multiple health conditions.   Objective:   Blood pressure 131/80, pulse 74, temperature 98.3 F (36.8 C), temperature source Oral, height 5\' 6"  (1.676 m), weight 250 lb (113.4 kg), SpO2 98 %, unknown if currently breastfeeding. Body mass index is 40.35 kg/m.  General: Cooperative, alert, well developed, in no acute distress. HEENT: Conjunctivae and lids unremarkable. Cardiovascular: Regular rhythm.  Lungs: Normal work of breathing. Neurologic: No  focal deficits.   Lab Results  Component Value Date   CREATININE 0.94 05/17/2019   BUN 13 05/17/2019   NA 139 05/17/2019   K 4.4 05/17/2019   CL 104 05/17/2019   CO2 19 (L) 05/17/2019   Lab Results    Component Value Date   ALT 22 05/17/2019   AST 18 05/17/2019   ALKPHOS 47 05/17/2019   BILITOT 0.4 05/17/2019   Lab Results  Component Value Date   HGBA1C 5.4 05/17/2019   Lab Results  Component Value Date   INSULIN 13.2 05/17/2019   Lab Results  Component Value Date   TSH 1.350 05/17/2019   Lab Results  Component Value Date   CHOL 183 05/17/2019   HDL 56 05/17/2019   LDLCALC 106 (H) 05/17/2019   TRIG 116 05/17/2019   CHOLHDL 3.3 05/17/2019   Lab Results  Component Value Date   WBC 8.0 05/17/2019   HGB 14.7 05/17/2019   HCT 42.9 05/17/2019   MCV 93 05/17/2019   PLT 242 05/17/2019   Lab Results  Component Value Date   IRON 66 05/17/2019   TIBC 310 05/17/2019   FERRITIN 70 05/17/2019   Attestation Statements:   Reviewed by clinician on day of visit: allergies, medications, problem list, medical history, surgical history, family history, social history, and previous encounter notes.  I, Insurance claims handler, CMA, am acting as Energy manager for [employer], DO.  I have reviewed the above documentation for accuracy and completeness, and I agree with the above. Helane Rima, DO

## 2019-07-15 ENCOUNTER — Encounter: Payer: Self-pay | Admitting: Family Medicine

## 2019-07-15 DIAGNOSIS — S93622A Sprain of tarsometatarsal ligament of left foot, initial encounter: Secondary | ICD-10-CM | POA: Insufficient documentation

## 2019-07-15 NOTE — Assessment & Plan Note (Signed)
Patient has a sprain with a questionable stress reaction of the lateral malleolus, talar dome and potentially the Lisfranc joint itself.  No gapping on dynamic testing on the ultrasound.  Patient put in a cam walker and can weight-bear as tolerated.  Discussed vitamin D supplementation.  Encourage continue but to take K2 over-the-counter.  Icing regimen, range of motion exercises for the ankle at this time.  Follow-up again in 2 to 3 weeks for further evaluation and treatment.

## 2019-08-03 ENCOUNTER — Other Ambulatory Visit: Payer: Self-pay

## 2019-08-03 ENCOUNTER — Ambulatory Visit (INDEPENDENT_AMBULATORY_CARE_PROVIDER_SITE_OTHER): Payer: BC Managed Care – PPO | Admitting: Family Medicine

## 2019-08-03 ENCOUNTER — Encounter (INDEPENDENT_AMBULATORY_CARE_PROVIDER_SITE_OTHER): Payer: Self-pay | Admitting: Family Medicine

## 2019-08-03 VITALS — BP 122/78 | HR 72 | Temp 98.9°F | Ht 66.0 in | Wt 246.0 lb

## 2019-08-03 DIAGNOSIS — M79672 Pain in left foot: Secondary | ICD-10-CM | POA: Diagnosis not present

## 2019-08-03 DIAGNOSIS — E8881 Metabolic syndrome: Secondary | ICD-10-CM

## 2019-08-03 DIAGNOSIS — E559 Vitamin D deficiency, unspecified: Secondary | ICD-10-CM

## 2019-08-03 DIAGNOSIS — Z6839 Body mass index (BMI) 39.0-39.9, adult: Secondary | ICD-10-CM

## 2019-08-03 MED ORDER — VITAMIN D (ERGOCALCIFEROL) 1.25 MG (50000 UNIT) PO CAPS: 50000.0000 [IU] | ORAL_CAPSULE | ORAL | 0 refills | Status: DC

## 2019-08-03 NOTE — Progress Notes (Signed)
[name] [address] [place] [address] Phone: [phone] Subjective:   [name], am serving as a scribe for Dr. Antoine Primas. This visit occurred during the SARS-CoV-2 public health emergency.  Safety protocols were in place, including screening questions prior to the visit, additional usage of staff PPE, and extensive cleaning of exam room while observing appropriate contact time as indicated for disinfecting solutions.   I'm seeing this patient by the request  of:  [name], MD  CC: Foot pain follow-up  BDZ:[id number]   07/14/2019 Patient has a sprain with a questionable stress reaction of the lateral malleolus, talar dome and potentially the Lisfranc joint itself.  No gapping on dynamic testing on the ultrasound.  Patient put in a cam walker and can weight-bear as tolerated.  Discussed vitamin D supplementation.  Encourage continue but to take K2 over-the-counter.  Icing regimen, range of motion exercises for the ankle at this time.  Follow-up again in 2 to 3 weeks for further evaluation and treatment.  Update 08/04/2019 Katie Glass is a 44 y.o. female coming in with complaint of foot pain.  Was found previously to have more of a Lisfranc sprain.  Patient was put in a cam walker. Patient feels like she is worse. Was using elliptical and felt foot shift. Pain over lateral aspect.   Also having right foot pain which she feels is similar to left foot.      Past Medical History:  Diagnosis Date  . Arthritis    knees  . Asthma    as a child  . Back pain   . Food allergy    Tannin/sulfate in Wine and ciders, rash on face and hands  . Joint pain   . Lower extremity edema   . MMT (medial meniscus tear)    right  . Sleep apnea    CPAP nightly  . Umbilical hernia    Past Surgical History:  Procedure Laterality Date  . BILATERAL SALPINGECTOMY Bilateral 04/15/2018   Procedure: BILATERAL SALPINGECTOMY;  Surgeon: Carrington Clamp, MD;  Location: [facility];  Service: Obstetrics;  Laterality: Bilateral;  . CESAREAN SECTION  11/12/2010   Procedure: CESAREAN SECTION;  Surgeon: Almon Hercules;  Location: [facility];  Service: Gynecology;  Laterality: N/A;  . CESAREAN SECTION N/A 04/15/2018   Procedure: CESAREAN SECTION;  Surgeon: Carrington Clamp, MD;  Location: [facility]THING SUIT[facility];  Service: Obstetrics;  Laterality: N/A;  . KNEE ARTHROSCOPY Right 02/11/2017   Procedure: RIGHT KNEE ARTHROSCOPY WITH DEBRIDEMENT CHONDROMALACIA;  Surgeon: Gean Birchwood, MD;  Location: [facility];  Service: Orthopedics;  Laterality: Right;   Social History   Socioeconomic History  . Marital status: Married    Spouse name: Jazlyne Gauger  . Number of children: Not on file  . Years of education: Not on file  . Highest education level: Not on file  Occupational History  . Occupation: Pastor  Tobacco Use  . Smoking status: Never Smoker  . Smokeless tobacco: Never Used  Substance and Sexual Activity  . Alcohol use: Yes    Comment: social  . Drug use: No  . Sexual activity: Yes    Birth control/protection: None  Other Topics Concern  . Not on file  Social History Narrative  . Not on file   Social Determinants of Health   Financial Resource Strain:   . Difficulty of Paying Living Expenses:   Food Insecurity:   . Worried About Programme researcher, broadcasting/film/video  in the Last Year:   . [place] in the Last Year:   Transportation Needs:   . Film/video editor (Medical):   Marland Kitchen Lack of Transportation (Non-Medical):   Physical Activity:   . Days of Exercise per Week:   . Minutes of Exercise per Session:   Stress:   . Feeling of Stress :   Social Connections:   . Frequency of Communication with Friends and Family:   . Frequency of Social Gatherings with Friends and Family:   . Attends Religious Services:   . Active Member of Clubs or Organizations:   . Attends Archivist Meetings:   Marland Kitchen Marital Status:     No Known Allergies Family History  Problem Relation Age of Onset  . Hypertension Father   . Hyperlipidemia Father   . Heart disease Father   . Cancer Maternal Grandfather   . Heart disease Paternal Grandfather   . Pulmonary embolism Mother   . Alcohol abuse Mother       Current Outpatient Medications (Respiratory):  .  cetirizine (ZYRTEC) 10 MG tablet, Take 10 mg by mouth daily. .  fluticasone (FLONASE) 50 MCG/ACT nasal spray, Place 1 spray into both nostrils daily.  Current Outpatient Medications (Analgesics):  .  acetaminophen (TYLENOL) 500 MG tablet, Take 500 mg by mouth every 6 (six) hours as needed. Marland Kitchen  ibuprofen (ADVIL) 200 MG tablet, Take 200 mg by mouth every 6 (six) hours as needed. .  meloxicam (MOBIC) 15 MG tablet, Take 1 tablet (15 mg total) by mouth daily.  Current Outpatient Medications (Hematological):  .  tranexamic acid (LYSTEDA) 650 MG TABS tablet, Take 1,300 mg by mouth. Take one tablet by mouth three times a day during menses  Current Outpatient Medications (Other):  Marland Kitchen  Multiple Vitamins-Minerals (WOMENS MULTI VITAMIN & MINERAL PO), Take 1 tablet by mouth daily. .  Vitamin D, Ergocalciferol, (DRISDOL) 1.25 MG (50000 UNIT) CAPS capsule, Take 1 capsule (50,000 Units total) by mouth every 7 (seven) days.   Reviewed prior external information including notes and imaging from  primary care provider As well as notes that were available from care everywhere and other healthcare systems.  Past medical history, social, surgical and family history all reviewed in electronic medical record.  No pertanent information unless stated regarding to the chief complaint.   Review of Systems:  No headache, visual changes, nausea, vomiting, diarrhea, constipation, dizziness, abdominal pain, skin rash, fevers, chills, night sweats, weight loss, swollen lymph nodes, body aches, joint swelling, chest pain, shortness of breath, mood changes. POSITIVE muscle aches  Objective   Blood pressure 112/74, pulse 92, height 5\' 6"  (1.676 m), weight 254 lb (115.2 kg), SpO2 97 %, unknown if currently breastfeeding.   General: No apparent distress alert and oriented x3 mood and affect normal, dressed appropriately.  HEENT: Pupils equal, extraocular movements intact  Respiratory: Patient's speak in full sentences and does not appear short of breath  Cardiovascular: No lower extremity edema, non tender, no erythema  Neuro: Cranial nerves II through XII are intact, neurovascularly intact in all extremities with 2+ DTRs and 2+ pulses.  Gait normal with good balance and coordination.  MSK: Foot exam shows patient does still have trace swelling of the left side compared to the right.  Mild tenderness diffusely over the midfoot.  Patient mildly discomfort as well.  Mostly over the talar dome.  No instability noted.  Limited musculoskeletal ultrasound was performed and interpreted by.me  Limited ultrasound shows patient does  still have some cortical irregularity noted of the Lisfranc joint.  Still some mild hypoechoic changes.  Does have some increase in neovascularization.  Palladone seems to be improved but still moderate arthritic changes noted.     Impression and Recommendations:     This case required medical decision making of moderate complexity. The above documentation has been reviewed and is accurate and complete Judi Saa, DO       Note: This dictation was prepared with Dragon dictation along with smaller phrase technology. Any transcriptional errors that result from this process are unintentional.

## 2019-08-03 NOTE — Progress Notes (Signed)
Chief Complaint:   OBESITY Katie Glass is here to discuss her progress with her obesity treatment plan along with follow-up of her obesity related diagnoses. Katie Glass is on the Category 4 Plan and states she is following her eating plan approximately 85% of the time. Katie Glass states she is doing modified exercises for 30-45 minutes 4-5 times per week.  Today's visit was #: 5 Starting weight: 268 lbs Starting date: 05/17/2019 Today's weight: 246 lbs Today's date: 08/03/2019 Total lbs lost to date: 22 lbs Total lbs lost since last in-office visit: 4 lbs  Interim History: Katie Glass has started weight training.  She has been alternating her arms and legs.  She has been doing core work as well.  Subjective:   1. Vitamin D deficiency Katie Glass's Vitamin D level was 22.9 on 05/17/2019. She is currently taking prescription vitamin D 50,000 IU each week. She denies nausea, vomiting or muscle weakness.  2. Left foot pain Katie Glass is seeing Dr. Katrinka Blazing for her left foot pain.  3. Insulin resistance Katie Glass has a diagnosis of insulin resistance based on her elevated fasting insulin level >5. She continues to work on diet and exercise to decrease her risk of diabetes.  Lab Results  Component Value Date   INSULIN 13.2 05/17/2019   Lab Results  Component Value Date   HGBA1C 5.4 05/17/2019   Assessment/Plan:   1. Vitamin D deficiency Low Vitamin D level contributes to fatigue and are associated with obesity, breast, and colon cancer. She agrees to continue to take prescription Vitamin D @50 ,000 IU every week and will follow-up for routine testing of Vitamin D, at least 2-3 times per year to avoid over-replacement.  Orders - Vitamin D, Ergocalciferol, (DRISDOL) 1.25 MG (50000 UNIT) CAPS capsule; Take 1 capsule (50,000 Units total) by mouth every 7 (seven) days.  Dispense: 4 capsule; Refill: 0  2. Left foot pain Will follow because mobility and pain control are important for weight management.  3.  Insulin resistance Katie Glass will continue to work on weight loss, exercise, and decreasing simple carbohydrates to help decrease the risk of diabetes. Katie Glass agreed to follow-up with Katie Glass as directed to closely monitor her progress.  4. Class 2 severe obesity with serious comorbidity and body mass index (BMI) of 39.0 to 39.9 in adult, unspecified obesity type [facility]) Katie Glass is currently in the action stage of change. As such, her goal is to continue with weight loss efforts. She has agreed to the Category 4 Plan.   Exercise goals: As is.  Behavioral modification strategies: emotional eating strategies.  Katie Glass has agreed to follow-up with our clinic in 2 weeks. She was informed of the importance of frequent follow-up visits to maximize her success with intensive lifestyle modifications for her multiple health conditions.   Objective:   Blood pressure 122/78, pulse 72, temperature 98.9 F (37.2 C), temperature source Oral, height 5\' 6"  (1.676 m), weight 246 lb (111.6 kg), SpO2 97 %, unknown if currently breastfeeding. Body mass index is 39.71 kg/m.  General: Cooperative, alert, well developed, in no acute distress. HEENT: Conjunctivae and lids unremarkable. Cardiovascular: Regular rhythm.  Lungs: Normal work of breathing. Neurologic: No focal deficits.   Lab Results  Component Value Date   CREATININE 0.94 05/17/2019   BUN 13 05/17/2019   NA 139 05/17/2019   K 4.4 05/17/2019   CL 104 05/17/2019   CO2 19 (L) 05/17/2019   Lab Results  Component Value Date   ALT 22 05/17/2019   AST 18 05/17/2019  ALKPHOS 47 05/17/2019   BILITOT 0.4 05/17/2019   Lab Results  Component Value Date   HGBA1C 5.4 05/17/2019   Lab Results  Component Value Date   INSULIN 13.2 05/17/2019   Lab Results  Component Value Date   TSH 1.350 05/17/2019   Lab Results  Component Value Date   CHOL 183 05/17/2019   HDL 56 05/17/2019   LDLCALC 106 (H) 05/17/2019   TRIG 116 05/17/2019   CHOLHDL 3.3  05/17/2019   Lab Results  Component Value Date   WBC 8.0 05/17/2019   HGB 14.7 05/17/2019   HCT 42.9 05/17/2019   MCV 93 05/17/2019   PLT 242 05/17/2019   Lab Results  Component Value Date   IRON 66 05/17/2019   TIBC 310 05/17/2019   FERRITIN 70 05/17/2019   Attestation Statements:   Reviewed by clinician on day of visit: allergies, medications, problem list, medical history, surgical history, family history, social history, and previous encounter notes.  I, Water quality scientist, CMA, am acting as Location manager for [employer], DO.  I have reviewed the above documentation for accuracy and completeness, and I agree with the above. Briscoe Deutscher, DO

## 2019-08-04 ENCOUNTER — Encounter: Payer: Self-pay | Admitting: Family Medicine

## 2019-08-04 ENCOUNTER — Ambulatory Visit: Payer: BC Managed Care – PPO | Admitting: Family Medicine

## 2019-08-04 ENCOUNTER — Ambulatory Visit: Payer: Self-pay

## 2019-08-04 VITALS — BP 112/74 | HR 92 | Ht 66.0 in | Wt 254.0 lb

## 2019-08-04 DIAGNOSIS — S93622A Sprain of tarsometatarsal ligament of left foot, initial encounter: Secondary | ICD-10-CM

## 2019-08-04 DIAGNOSIS — M79672 Pain in left foot: Secondary | ICD-10-CM

## 2019-08-04 MED ORDER — MELOXICAM 15 MG PO TABS: 15.0000 mg | ORAL_TABLET | Freq: Every day | ORAL | 0 refills | Status: DC

## 2019-08-04 NOTE — Assessment & Plan Note (Signed)
Lisfranc sprain.  Patient seems to be doing better overall.  Mild irregularity of the talar dome.  On exam the patient is slowly healing compared to what I would anticipate.  Meloxicam given instead of the ibuprofen.  Discussed which activities to do which wants to avoid.  Continue the vitamin D and the K2 supplementation.  Follow-up again in 3 to 4 weeks.  Patient will be transitioning out of the boot in the next 10 days and then switching to an Aircast.

## 2019-08-04 NOTE — Patient Instructions (Signed)
Boot for 10 days In house try [employer] Total Support Orthotics Exercises 3x a week after 10 days Aircast with working out and long walks for next month Meloxicam daily for 10 days then as needed Ice is still your friend See me in 3-4 weeks before [place]

## 2019-08-10 DIAGNOSIS — Z1231 Encounter for screening mammogram for malignant neoplasm of breast: Secondary | ICD-10-CM | POA: Diagnosis not present

## 2019-08-12 ENCOUNTER — Encounter (INDEPENDENT_AMBULATORY_CARE_PROVIDER_SITE_OTHER): Payer: Self-pay | Admitting: Family Medicine

## 2019-08-15 NOTE — Telephone Encounter (Signed)
Please advise. Thanks.  

## 2019-08-18 ENCOUNTER — Ambulatory Visit (INDEPENDENT_AMBULATORY_CARE_PROVIDER_SITE_OTHER): Payer: BC Managed Care – PPO | Admitting: Family Medicine

## 2019-08-18 ENCOUNTER — Encounter: Payer: Self-pay | Admitting: Family Medicine

## 2019-08-18 DIAGNOSIS — S93622A Sprain of tarsometatarsal ligament of left foot, initial encounter: Secondary | ICD-10-CM | POA: Diagnosis not present

## 2019-08-18 NOTE — Progress Notes (Signed)
Katie [name] [place]to Phone: [phone] Subjective:   Katie [name], am serving as a scribe for Dr. Hulan Saas. This visit occurred during the SARS-CoV-2 public health emergency.  Safety protocols were in place, including screening questions prior to the visit, additional usage of staff PPE, and extensive cleaning of exam room while observing appropriate contact time as indicated for disinfecting solutions.   I'm seeing this patient by the request  of:  Katie Contras, MD  CC: Ankle pain follow-up  GBT:[id number]   08/04/2019 Lisfranc sprain.  Patient seems to be doing better overall.  Mild irregularity of the talar dome.  On exam the patient is slowly healing compared to what I would anticipate.  Meloxicam given instead of the ibuprofen.  Discussed which activities to do which wants to avoid.  Continue the vitamin D and the K2 supplementation.  Follow-up again in 3 to 4 weeks.  Patient will be transitioning out of the boot in the next 10 days and then switching to an Aircast.  Update 08/18/2019 Katie [name] is a 44 y.o. female coming in with complaint of left foot pain. Patient states she is [name] longer wearing boot. Tried wearing aircast but this put pressure on foot that was uncomfortable. Has switched shoes to [place] with [employer]. Does have relief with this. Has been using elliptical. Notes putting more pressure on left foot than right when standing.        Past Medical History:  Diagnosis Date  . Arthritis    knees  . Asthma    as a child  . Back pain   . Food allergy    Tannin/sulfate in [place] and ciders, rash on face and hands  . Joint pain   . Lower extremity edema   . MMT (medial meniscus tear)    right  . Sleep apnea    CPAP nightly  . Umbilical hernia    Past Surgical History:  Procedure Laterality Date  . BILATERAL SALPINGECTOMY Bilateral 04/15/2018   Procedure: BILATERAL  SALPINGECTOMY;  Surgeon: Katie Charleston, MD;  Location: [place];  Service: Obstetrics;  Laterality: Bilateral;  . CESAREAN SECTION  11/12/2010   Procedure: CESAREAN SECTION;  Surgeon: Katie [name];  Location: [facility];  Service: Gynecology;  Laterality: N/A;  . CESAREAN SECTION N/A 04/15/2018   Procedure: CESAREAN SECTION;  Surgeon: Katie Charleston, MD;  Location: [place];  Service: Obstetrics;  Laterality: N/A;  . KNEE ARTHROSCOPY Right 02/11/2017   Procedure: RIGHT KNEE ARTHROSCOPY WITH DEBRIDEMENT CHONDROMALACIA;  Surgeon: Katie Pear, MD;  Location: [place];  Service: Orthopedics;  Laterality: Right;   Social History   Socioeconomic History  . Marital status: Married    Spouse name: Katie [name]  . Number of children: Not on file  . Years of education: Not on file  . Highest education level: Not on file  Occupational History  . Occupation: Pastor  Tobacco Use  . Smoking status: Never Smoker  . Smokeless tobacco: Never Used  Substance and Sexual Activity  . Alcohol use: Yes    Comment: social  . Drug use: [name]  . Sexual activity: Yes    Birth control/protection: None  Other Topics Concern  . Not on file  Social History Narrative  . Not on file   Social Determinants of Health   Financial Resource Strain:   . Difficulty of Paying Living Expenses:   Food Insecurity:   .  Worried About Programme researcher, broadcasting/film/video in the Last Year:   . Barista in the Last Year:   Transportation Needs:   . Freight forwarder (Medical):   Marland Kitchen Lack of Transportation (Non-Medical):   Physical Activity:   . Days of Exercise per Week:   . Minutes of Exercise per Session:   Stress:   . Feeling of Stress :   Social Connections:   . Frequency of Communication with Friends and Family:   . Frequency of Social Gatherings with Friends and Family:   . Attends Religious Services:   . Active Member of Clubs or Organizations:   . Attends Tax inspector Meetings:   Marland Kitchen Marital Status:    [name] Known Allergies Family History  Problem Relation Age of Onset  . Hypertension Father   . Hyperlipidemia Father   . Heart disease Father   . Cancer Maternal Grandfather   . Heart disease Paternal Grandfather   . Pulmonary embolism Mother   . Alcohol abuse Mother       Current Outpatient Medications (Respiratory):  .  cetirizine (ZYRTEC) 10 MG tablet, Take 10 mg by mouth daily. .  fluticasone (FLONASE) 50 MCG/ACT nasal spray, Place 1 spray into both nostrils daily.  Current Outpatient Medications (Analgesics):  .  acetaminophen (TYLENOL) 500 MG tablet, Take 500 mg by mouth every 6 (six) hours as needed. Marland Kitchen  ibuprofen (ADVIL) 200 MG tablet, Take 200 mg by mouth every 6 (six) hours as needed. .  meloxicam (MOBIC) 15 MG tablet, Take 1 tablet (15 mg total) by mouth daily.  Current Outpatient Medications (Hematological):  .  tranexamic acid (LYSTEDA) 650 MG TABS tablet, Take 1,300 mg by mouth. Take one tablet by mouth three times a day during menses  Current Outpatient Medications (Other):  Marland Kitchen  Multiple Vitamins-Minerals (WOMENS MULTI VITAMIN & MINERAL PO), Take 1 tablet by mouth daily. .  Vitamin D, Ergocalciferol, (DRISDOL) 1.25 MG (50000 UNIT) CAPS capsule, Take 1 capsule (50,000 Units total) by mouth every 7 (seven) days.   Reviewed prior external information including notes and imaging from  primary care provider As well as notes that were available from care everywhere and other healthcare systems.  Past medical history, social, surgical and family history all reviewed in electronic medical record.  [name] pertanent information unless stated regarding to the chief complaint.   Review of Systems:  [name] headache, visual changes, nausea, vomiting, diarrhea, constipation, dizziness, abdominal pain, skin rash, fevers, chills, night sweats, weight loss, swollen lymph nodes, body aches, joint swelling, chest pain, shortness of breath, mood  changes. POSITIVE muscle aches  Objective  Blood pressure 118/84, pulse 65, height 5\' 6"  (1.676 m), weight 249 lb (112.9 kg), SpO2 99 %, unknown if currently breastfeeding.   General: [name] apparent distress alert and oriented x3 mood and affect normal, dressed appropriately.  HEENT: Pupils equal, extraocular movements intact  Respiratory: Patient's speak in full sentences and does not appear short of breath  Cardiovascular: [name] lower extremity edema, non tender, [name] erythema  Neuro: Cranial nerves II through XII are intact, neurovascularly intact in all extremities with 2+ DTRs and 2+ pulses.  Gait normal with good balance and coordination.  MSK: Left ankle shows the patient does have decreased swelling noted.  Still some mild limited dorsiflexion and some very minimal pain on the anterior lateral aspect of the talar dome.  Otherwise fairly unremarkable.  Patient is able to ambulate without any significant type of limp.  Impression and Recommendations:     This case required medical decision making of moderate complexity. The above documentation has been reviewed and is accurate and complete Katie Pulley, DO       Note: This dictation was prepared with Dragon dictation along with smaller phrase technology. Any transcriptional errors that result from this process are unintentional.

## 2019-08-18 NOTE — Assessment & Plan Note (Signed)
Lisfranc seems to be doing very well.  Able to start walking.  Did have a mild abnormal

## 2019-08-19 ENCOUNTER — Encounter: Payer: Self-pay | Admitting: Family Medicine

## 2019-08-29 ENCOUNTER — Encounter (INDEPENDENT_AMBULATORY_CARE_PROVIDER_SITE_OTHER): Payer: Self-pay | Admitting: Family Medicine

## 2019-08-29 ENCOUNTER — Ambulatory Visit (INDEPENDENT_AMBULATORY_CARE_PROVIDER_SITE_OTHER): Payer: BC Managed Care – PPO | Admitting: Family Medicine

## 2019-08-29 ENCOUNTER — Other Ambulatory Visit: Payer: Self-pay

## 2019-08-29 VITALS — BP 138/78 | HR 60 | Temp 98.0°F | Ht 66.0 in | Wt 244.0 lb

## 2019-08-29 DIAGNOSIS — Z9189 Other specified personal risk factors, not elsewhere classified: Secondary | ICD-10-CM | POA: Diagnosis not present

## 2019-08-29 DIAGNOSIS — E559 Vitamin D deficiency, unspecified: Secondary | ICD-10-CM | POA: Diagnosis not present

## 2019-08-29 DIAGNOSIS — Z6839 Body mass index (BMI) 39.0-39.9, adult: Secondary | ICD-10-CM

## 2019-08-29 DIAGNOSIS — M79672 Pain in left foot: Secondary | ICD-10-CM | POA: Diagnosis not present

## 2019-08-29 MED ORDER — VITAMIN D (ERGOCALCIFEROL) 1.25 MG (50000 UNIT) PO CAPS: 50000.0000 [IU] | ORAL_CAPSULE | ORAL | 0 refills | Status: DC

## 2019-08-29 NOTE — Progress Notes (Signed)
Chief Complaint:   OBESITY Katie Glass is here to discuss her progress with her obesity treatment plan along with follow-up of her obesity related diagnoses. Katie Glass is on the Category 4 Plan and states she is following her eating plan approximately 85% of the time. Katie Glass states she is boot camp/HIIT/cardio/weights for 30-60 minutes 4-5 times per week.  Today's visit was #: 6 Starting weight: 268 lbs Starting date: 05/17/2019 Today's weight: 244 lbs Today's date: 08/29/2019 Total lbs lost to date: 24 lbs Total lbs lost since last in-office visit: 2 lbs  Interim History: Katie Glass says her foot hurts but she still continues to exercise.  She is wearing supportive tennis shoes.  She is up 2 pounds of fluid today.  Assessment/Plan:   1. Vitamin D deficiency Kia's Vitamin D level was 22.9 on 05/17/2019. She is currently taking prescription vitamin D 50,000 IU each week. She denies nausea, vomiting or muscle weakness. Low Vitamin D level contributes to fatigue and are associated with obesity, breast, and colon cancer. She agrees to continue to take prescription Vitamin D @50 ,000 IU every week and will follow-up for routine testing of Vitamin D, at least 2-3 times per year to avoid over-replacement.  Orders - Vitamin D, Ergocalciferol, (DRISDOL) 1.25 MG (50000 UNIT) CAPS capsule; Take 1 capsule (50,000 Units total) by mouth every 7 (seven) days.  Dispense: 4 capsule; Refill: 0  2. Left foot pain Followed by Sports Medicine for this problem. Those encounter notes were reviewed. This issue directly impacts care plan for optimization of BMI and metabolic health as it impacts the patient's ability to make lifestyle changes.  3. At risk for heart disease Katie Glass was given approximately 15 minutes of coronary artery disease prevention counseling today. She is 44 y.o. female and has risk factors for heart disease including obesity. We discussed intensive lifestyle modifications today with an  emphasis on specific weight loss instructions and strategies.   Repetitive spaced learning was employed today to elicit superior memory formation and behavioral change.  4. Class 2 severe obesity with serious comorbidity and body mass index (BMI) of 39.0 to 39.9 in adult, unspecified obesity type [facility]) Katie Glass is currently in the action stage of change. As such, her goal is to continue with weight loss efforts. She has agreed to the Category 4 Plan.   Exercise goals: For substantial health benefits, adults should do at least 150 minutes (2 hours and 30 minutes) a week of moderate-intensity, or 75 minutes (1 hour and 15 minutes) a week of vigorous-intensity aerobic physical activity, or an equivalent combination of moderate- and vigorous-intensity aerobic activity. Aerobic activity should be performed in episodes of at least 10 minutes, and preferably, it should be spread throughout the week.  Behavioral modification strategies: increasing water intake.  Katie Glass has agreed to follow-up with our clinic in 3 weeks. She was informed of the importance of frequent follow-up visits to maximize her success with intensive lifestyle modifications for her multiple health conditions.   Objective:   Blood pressure 138/78, pulse 60, temperature 98 F (36.7 C), temperature source Oral, height 5\' 6"  (1.676 m), weight 244 lb (110.7 kg), SpO2 99 %, unknown if currently breastfeeding. Body mass index is 39.38 kg/m.  General: Cooperative, alert, well developed, in no acute distress. HEENT: Conjunctivae and lids unremarkable. Cardiovascular: Regular rhythm.  Lungs: Normal work of breathing. Neurologic: No focal deficits.   Lab Results  Component Value Date   CREATININE 0.94 05/17/2019   BUN 13 05/17/2019   NA  139 05/17/2019   K 4.4 05/17/2019   CL 104 05/17/2019   CO2 19 (L) 05/17/2019   Lab Results  Component Value Date   ALT 22 05/17/2019   AST 18 05/17/2019   ALKPHOS 47 05/17/2019   BILITOT 0.4  05/17/2019   Lab Results  Component Value Date   HGBA1C 5.4 05/17/2019   Lab Results  Component Value Date   INSULIN 13.2 05/17/2019   Lab Results  Component Value Date   TSH 1.350 05/17/2019   Lab Results  Component Value Date   CHOL 183 05/17/2019   HDL 56 05/17/2019   LDLCALC 106 (H) 05/17/2019   TRIG 116 05/17/2019   CHOLHDL 3.3 05/17/2019   Lab Results  Component Value Date   WBC 8.0 05/17/2019   HGB 14.7 05/17/2019   HCT 42.9 05/17/2019   MCV 93 05/17/2019   PLT 242 05/17/2019   Lab Results  Component Value Date   IRON 66 05/17/2019   TIBC 310 05/17/2019   FERRITIN 70 05/17/2019   Attestation Statements:   Reviewed by clinician on day of visit: allergies, medications, problem list, medical history, surgical history, family history, social history, and previous encounter notes.  I, Water quality scientist, CMA, am acting as transcriptionist for Briscoe Deutscher, DO  I have reviewed the above documentation for accuracy and completeness, and I agree with the above. Briscoe Deutscher, DO

## 2019-08-31 DIAGNOSIS — G4733 Obstructive sleep apnea (adult) (pediatric): Secondary | ICD-10-CM | POA: Diagnosis not present

## 2019-09-21 ENCOUNTER — Encounter: Payer: Self-pay | Admitting: Family Medicine

## 2019-09-21 ENCOUNTER — Other Ambulatory Visit: Payer: Self-pay

## 2019-09-21 ENCOUNTER — Encounter (INDEPENDENT_AMBULATORY_CARE_PROVIDER_SITE_OTHER): Payer: Self-pay | Admitting: Family Medicine

## 2019-09-21 ENCOUNTER — Ambulatory Visit (INDEPENDENT_AMBULATORY_CARE_PROVIDER_SITE_OTHER): Payer: BC Managed Care – PPO | Admitting: Family Medicine

## 2019-09-21 VITALS — BP 124/73 | HR 64 | Temp 97.7°F | Ht 66.0 in | Wt 244.0 lb

## 2019-09-21 DIAGNOSIS — Z6839 Body mass index (BMI) 39.0-39.9, adult: Secondary | ICD-10-CM

## 2019-09-21 DIAGNOSIS — M25572 Pain in left ankle and joints of left foot: Secondary | ICD-10-CM

## 2019-09-21 DIAGNOSIS — E559 Vitamin D deficiency, unspecified: Secondary | ICD-10-CM | POA: Diagnosis not present

## 2019-09-21 DIAGNOSIS — Z9189 Other specified personal risk factors, not elsewhere classified: Secondary | ICD-10-CM | POA: Diagnosis not present

## 2019-09-21 DIAGNOSIS — R5383 Other fatigue: Secondary | ICD-10-CM | POA: Diagnosis not present

## 2019-09-21 NOTE — Progress Notes (Signed)
Chief Complaint:   OBESITY Katie Glass is here to discuss her progress with her obesity treatment plan along with follow-up of her obesity related diagnoses. Katie Glass is on the Category 4 Plan and states she is following her eating plan approximately 65-70% of the time. Katie Glass states she is HIIT workout for 35-40 minutes 2-3 times per week.  Today's visit was #: 7 Starting weight: 268 lbs Starting date: 05/17/2019 Today's weight: 244 lbs Today's date: 09/21/2019 Total lbs lost to date: 24 lbs Total lbs lost since last in-office visit: 0  Subjective:   1. Left ankle pain, [place] says this is improving.  She still has limited plantar flexion.  2. Vitamin D deficiency Katie Glass's Vitamin D level was 22.9 on 05/17/2019. She is currently taking prescription vitamin D 50,000 IU each week. She denies nausea, vomiting or muscle weakness.  3. Other fatigue Katie Glass endorses more fatigue than usual.  Assessment/Plan:   1. Left ankle pain Will continue to monitor. This issue directly impacts care plan for optimization of BMI and metabolic health as it impacts the patient's ability to make lifestyle changes.  2. Vitamin D deficiency Low Vitamin D level contributes to fatigue and are associated with obesity, breast, and colon cancer. She agrees to continue to take prescription Vitamin D @50 ,000 IU every week and will follow-up for routine testing of Vitamin D, at least 2-3 times per year to avoid over-replacement.  3. Other fatigue  Orders Placed This Encounter  Procedures  . CBC with Differential/Platelet  . Comprehensive metabolic panel  . Hemoglobin A1c  . Insulin, random  . VITAMIN D 25 Hydroxy (Vit-D Deficiency, Fractures)  . Lipid Panel With LDL/HDL Ratio  . Anemia panel   4. At risk for activity intolerance Katie Glass was given approximately 15 minutes of exercise intolerance counseling today. She is 44 y.o. female and has risk factors exercise intolerance including obesity. We  discussed intensive lifestyle modifications today with an emphasis on specific weight loss instructions and strategies. Katie Glass will slowly increase activity as tolerated.  Repetitive spaced learning was employed today to elicit superior memory formation and behavioral change.  5. Class 2 severe obesity with serious comorbidity and body mass index (BMI) of 39.0 to 39.9 in adult, unspecified obesity type [facility]) Katie Glass is currently in the action stage of change. As such, her goal is to continue with weight loss efforts. She has agreed to the Category 4 Plan.   Exercise goals: For substantial health benefits, adults should do at least 150 minutes (2 hours and 30 minutes) a week of moderate-intensity, or 75 minutes (1 hour and 15 minutes) a week of vigorous-intensity aerobic physical activity, or an equivalent combination of moderate- and vigorous-intensity aerobic activity. Aerobic activity should be performed in episodes of at least 10 minutes, and preferably, it should be spread throughout the week.  Behavioral modification strategies: increasing lean protein intake.  Katie Glass has agreed to follow-up with our clinic in 2-3 weeks. She was informed of the importance of frequent follow-up visits to maximize her success with intensive lifestyle modifications for her multiple health conditions.   Objective:   Blood pressure 124/73, pulse 64, temperature 97.7 F (36.5 C), temperature source Oral, height 5\' 6"  (1.676 m), weight 244 lb (110.7 kg), SpO2 98 %, unknown if currently breastfeeding. Body mass index is 39.38 kg/m.  General: Cooperative, alert, well developed, in no acute distress. HEENT: Conjunctivae and lids unremarkable. Cardiovascular: Regular rhythm.  Lungs: Normal work of breathing. Neurologic: No focal deficits.  Lab Results  Component Value Date   CREATININE 0.94 05/17/2019   BUN 13 05/17/2019   NA 139 05/17/2019   K 4.4 05/17/2019   CL 104 05/17/2019   CO2 19 (L) 05/17/2019     Lab Results  Component Value Date   ALT 22 05/17/2019   AST 18 05/17/2019   ALKPHOS 47 05/17/2019   BILITOT 0.4 05/17/2019   Lab Results  Component Value Date   HGBA1C 5.4 05/17/2019   Lab Results  Component Value Date   INSULIN 13.2 05/17/2019   Lab Results  Component Value Date   TSH 1.350 05/17/2019   Lab Results  Component Value Date   CHOL 183 05/17/2019   HDL 56 05/17/2019   LDLCALC 106 (H) 05/17/2019   TRIG 116 05/17/2019   CHOLHDL 3.3 05/17/2019   Lab Results  Component Value Date   WBC 8.0 05/17/2019   HGB 14.7 05/17/2019   HCT 42.9 05/17/2019   MCV 93 05/17/2019   PLT 242 05/17/2019   Lab Results  Component Value Date   IRON 66 05/17/2019   TIBC 310 05/17/2019   FERRITIN 70 05/17/2019   Attestation Statements:   Reviewed by clinician on day of visit: allergies, medications, problem list, medical history, surgical history, family history, social history, and previous encounter notes.  I, Insurance claims handler, CMA, am acting as transcriptionist for Helane Rima, DO  I have reviewed the above documentation for accuracy and completeness, and I agree with the above. Helane Rima, DO

## 2019-09-21 NOTE — Telephone Encounter (Signed)
fyi

## 2019-09-22 LAB — CBC WITH DIFFERENTIAL/PLATELET
Basophils Absolute: 0 10*3/uL (ref 0.0–0.2)
Basos: 0 %
EOS (ABSOLUTE): 0.1 10*3/uL (ref 0.0–0.4)
Eos: 2 %
Hemoglobin: 13.9 g/dL (ref 11.1–15.9)
Immature Grans (Abs): 0 10*3/uL (ref 0.0–0.1)
Immature Granulocytes: 0 %
Lymphocytes Absolute: 1.2 10*3/uL (ref 0.7–3.1)
Lymphs: 16 %
MCH: 32.4 pg (ref 26.6–33.0)
MCHC: 35 g/dL (ref 31.5–35.7)
MCV: 93 fL (ref 79–97)
Monocytes Absolute: 0.6 10*3/uL (ref 0.1–0.9)
Monocytes: 9 %
Neutrophils Absolute: 5.1 10*3/uL (ref 1.4–7.0)
Neutrophils: 73 %
Platelets: 232 10*3/uL (ref 150–450)
RBC: 4.29 x10E6/uL (ref 3.77–5.28)
RDW: 11.6 % — ABNORMAL LOW (ref 11.7–15.4)
WBC: 7.1 10*3/uL (ref 3.4–10.8)

## 2019-09-22 LAB — COMPREHENSIVE METABOLIC PANEL
ALT: 9 IU/L (ref 0–32)
AST: 14 IU/L (ref 0–40)
Albumin/Globulin Ratio: 1.4 (ref 1.2–2.2)
Albumin: 4.2 g/dL (ref 3.8–4.8)
Alkaline Phosphatase: 45 IU/L — ABNORMAL LOW (ref 48–121)
BUN/Creatinine Ratio: 15 (ref 9–23)
BUN: 13 mg/dL (ref 6–24)
Bilirubin Total: 0.6 mg/dL (ref 0.0–1.2)
CO2: 22 mmol/L (ref 20–29)
Calcium: 9.3 mg/dL (ref 8.7–10.2)
Chloride: 102 mmol/L (ref 96–106)
Creatinine, Ser: 0.87 mg/dL (ref 0.57–1.00)
GFR calc Af Amer: 94 mL/min/{1.73_m2} (ref >59)
GFR calc non Af Amer: 82 mL/min/{1.73_m2} (ref >59)
Globulin, Total: 3.1 g/dL (ref 1.5–4.5)
Glucose: 88 mg/dL (ref 65–99)
Potassium: 4.2 mmol/L (ref 3.5–5.2)
Sodium: 142 mmol/L (ref 134–144)
Total Protein: 7.3 g/dL (ref 6.0–8.5)

## 2019-09-22 LAB — ANEMIA PANEL
Ferritin: 51 ng/mL (ref 15–150)
Folate, Hemolysate: 421 ng/mL
Folate, RBC: 1060 ng/mL (ref >498)
Hematocrit: 39.7 % (ref 34.0–46.6)
Iron Saturation: 31 % (ref 15–55)
Iron: 93 ug/dL (ref 27–159)
Retic Ct Pct: 2.1 % (ref 0.6–2.6)
Total Iron Binding Capacity: 299 ug/dL (ref 250–450)
UIBC: 206 ug/dL (ref 131–425)
Vitamin B-12: 462 pg/mL (ref 232–1245)

## 2019-09-22 LAB — HEMOGLOBIN A1C
Est. average glucose Bld gHb Est-mCnc: 103 mg/dL
Hgb A1c MFr Bld: 5.2 % (ref 4.8–5.6)

## 2019-09-22 LAB — LIPID PANEL WITH LDL/HDL RATIO
Cholesterol, Total: 158 mg/dL (ref 100–199)
HDL: 58 mg/dL (ref >39)
LDL Chol Calc (NIH): 85 mg/dL (ref 0–99)
LDL/HDL Ratio: 1.5 ratio (ref 0.0–3.2)
Triglycerides: 77 mg/dL (ref 0–149)
VLDL Cholesterol Cal: 15 mg/dL (ref 5–40)

## 2019-09-22 LAB — VITAMIN D 25 HYDROXY (VIT D DEFICIENCY, FRACTURES): Vit D, 25-Hydroxy: 25 ng/mL — ABNORMAL LOW (ref 30.0–100.0)

## 2019-09-22 LAB — INSULIN, RANDOM: INSULIN: 7.6 u[IU]/mL (ref 2.6–24.9)

## 2019-10-04 DIAGNOSIS — Z0000 Encounter for general adult medical examination without abnormal findings: Secondary | ICD-10-CM | POA: Diagnosis not present

## 2019-10-11 ENCOUNTER — Other Ambulatory Visit (INDEPENDENT_AMBULATORY_CARE_PROVIDER_SITE_OTHER): Payer: Self-pay | Admitting: Family Medicine

## 2019-10-11 DIAGNOSIS — E559 Vitamin D deficiency, unspecified: Secondary | ICD-10-CM

## 2019-10-11 NOTE — Telephone Encounter (Signed)
Please see

## 2019-10-12 MED ORDER — VITAMIN D (ERGOCALCIFEROL) 1.25 MG (50000 UNIT) PO CAPS: 50000.0000 [IU] | ORAL_CAPSULE | ORAL | 0 refills | Status: DC

## 2019-11-01 ENCOUNTER — Encounter (INDEPENDENT_AMBULATORY_CARE_PROVIDER_SITE_OTHER): Payer: Self-pay | Admitting: Family Medicine

## 2019-11-01 ENCOUNTER — Ambulatory Visit (INDEPENDENT_AMBULATORY_CARE_PROVIDER_SITE_OTHER): Payer: BC Managed Care – PPO | Admitting: Family Medicine

## 2019-11-01 ENCOUNTER — Other Ambulatory Visit: Payer: Self-pay

## 2019-11-01 VITALS — BP 132/76 | HR 60 | Temp 98.1°F | Ht 66.0 in | Wt 242.0 lb

## 2019-11-01 DIAGNOSIS — Z9189 Other specified personal risk factors, not elsewhere classified: Secondary | ICD-10-CM | POA: Diagnosis not present

## 2019-11-01 DIAGNOSIS — E559 Vitamin D deficiency, unspecified: Secondary | ICD-10-CM | POA: Diagnosis not present

## 2019-11-01 DIAGNOSIS — Z6839 Body mass index (BMI) 39.0-39.9, adult: Secondary | ICD-10-CM | POA: Diagnosis not present

## 2019-11-01 MED ORDER — PHENTERMINE HCL 15 MG PO CAPS: 15.0000 mg | ORAL_CAPSULE | ORAL | 0 refills | Status: DC

## 2019-11-01 MED ORDER — VITAMIN D (ERGOCALCIFEROL) 1.25 MG (50000 UNIT) PO CAPS: 50000.0000 [IU] | ORAL_CAPSULE | ORAL | 0 refills | Status: DC

## 2019-11-01 NOTE — Progress Notes (Signed)
Chief Complaint:   OBESITY Katie Glass is here to discuss her progress with her obesity treatment plan along with follow-up of her obesity related diagnoses. Katie Glass is on the Category 4 Plan and states she is following her eating plan approximately 70% of the time. Katie Glass states she is doing boot camp, Zumba, cardio, and strength training for 30-45 minutes 3-4 times per week.  Today's visit was #: 8 Starting weight: 268 lbs Starting date: 09/21/2019 Today's weight: 242 lbs Today's date: 11/01/2019 Total lbs lost to date: 26 lbs Total lbs lost since last in-office visit: 2 lbs Total weight loss percentage to date: -9.70%  Interim History: Katie Glass endorses polyphagia.    Assessment/Plan:   1. Vitamin D deficiency Not at goal. Optimal goal > 50 ng/dL. There is also evidence to support a goal of >70 ng/dL in patients with cancer and heart disease. Katie Glass's Vitamin D level was 25.0 on 09/21/2019. She agrees to continue to take prescription Vitamin D '@50'$ ,000 IU every week and will follow-up for routine testing of Vitamin D, at least 2-3 times per year to avoid over-replacement.  - Vitamin D, Ergocalciferol, (DRISDOL) 1.25 MG (50000 UNIT) CAPS capsule; Take 1 capsule (50,000 Units total) by mouth every 7 (seven) days.  Dispense: 4 capsule; Refill: 0  2. At risk for adverse drug reaction Katie Glass was given approximately 15 minutes of phentermine side effects counseling today.  We discussed side effect possibility and risk versus benefits. Katie Glass agreed to the medication and will contact this office if these side effects are intolerable.  Phentermine is a sympathomimetic agent that suppresses appetite. Side effects are generally mild, and include dry mouth, insomnia, agitation, constipation and tachycardia. Using the lowest effective dose reduces risks of side effects.  Historically, it has been approved as a short-term therapy, but for treating obesity, a chronic condition, this is illogical.  Phentermine has now been studied over a two-year period in combination with topiramate, and the combination is approved for long-term use by the [place] Food and Drug Administration (FDA). Long term is at the discretion of the practitioner in conjunction with the patient. It may be reasonable to prescribe phentermine long term, provided the following criteria are met:  . low-to-intermediate cardiovascular risk with no evidence of serious cardiovascular disease. . no serious psychiatric disease or history of substance abuse. . no clinically significant increase in pulse or blood pressure while taking phentermine. . close monitoring - during dose escalation and at least every three months thereafter.  Phentermine abuse or psychological dependence (addiction) does not occur in patients treated with phentermine for obesity. Phentermine treatment does not induce phentermine drug craving, a hallmark sign of addiction. Amphetamine-like withdrawal does not occur upon abrupt treatment cessation even at doses much higher than commonly recommended and after treatment durations of up to 21 years. Katie Glass, E., Srisurapanont, M., Virgina Norfolk et al. Addiction potential of phentermine prescribed during long-term treatment of obesity. Int J Obes 85, 292-298 (2014).  3. Class 2 severe obesity with serious comorbidity and body mass index (BMI) of 39.0 to 39.9 in adult, unspecified obesity type (Katie Glass)   Orders - phentermine 15 MG capsule; Take 1 capsule (15 mg total) by mouth every morning.  Dispense: 30 capsule; Refill: 0  Katie Glass is currently in the action stage of change. As such, her goal is to continue with weight loss efforts. She has agreed to the Category 4 Plan.   Exercise goals: For substantial health benefits, adults should do at least 150  minutes (2 hours and 30 minutes) a week of moderate-intensity, or 75 minutes (1 hour and 15 minutes) a week of vigorous-intensity aerobic physical activity, or an equivalent  combination of moderate- and vigorous-intensity aerobic activity. Aerobic activity should be performed in episodes of at least 10 minutes, and preferably, it should be spread throughout the week.  Behavioral modification strategies: increasing lean protein intake and better snacking choices.  Katie Glass has agreed to follow-up with our clinic in 3 weeks. She was informed of the importance of frequent follow-up visits to maximize her success with intensive lifestyle modifications for her multiple health conditions.   Objective:   Blood pressure 132/76, pulse 60, temperature 98.1 F (36.7 C), temperature source Oral, height '5\' 6"'$  (1.676 m), weight 242 lb (109.8 kg), SpO2 98 %, unknown if currently breastfeeding. Body mass index is 39.06 kg/m.  General: Cooperative, alert, well developed, in no acute distress. HEENT: Conjunctivae and lids unremarkable. Cardiovascular: Regular rhythm.  Lungs: Normal work of breathing. Neurologic: No focal deficits.   Lab Results  Component Value Date   CREATININE 0.87 09/21/2019   BUN 13 09/21/2019   NA 142 09/21/2019   K 4.2 09/21/2019   CL 102 09/21/2019   CO2 22 09/21/2019   Lab Results  Component Value Date   ALT 9 09/21/2019   AST 14 09/21/2019   ALKPHOS 45 (L) 09/21/2019   BILITOT 0.6 09/21/2019   Lab Results  Component Value Date   HGBA1C 5.2 09/21/2019   HGBA1C 5.4 05/17/2019   Lab Results  Component Value Date   INSULIN 7.6 09/21/2019   INSULIN 13.2 05/17/2019   Lab Results  Component Value Date   TSH 1.350 05/17/2019   Lab Results  Component Value Date   CHOL 158 09/21/2019   HDL 58 09/21/2019   LDLCALC 85 09/21/2019   TRIG 77 09/21/2019   CHOLHDL 3.3 05/17/2019   Lab Results  Component Value Date   WBC 7.1 09/21/2019   HGB 13.9 09/21/2019   HCT 39.7 09/21/2019   MCV 93 09/21/2019   PLT 232 09/21/2019   Lab Results  Component Value Date   IRON 93 09/21/2019   TIBC 299 09/21/2019   FERRITIN 51 09/21/2019    Attestation Statements:   Reviewed by clinician on day of visit: allergies, medications, problem list, medical history, surgical history, family history, social history, and previous encounter notes.  I, Water quality scientist, CMA, am acting as transcriptionist for Briscoe Deutscher, DO  I have reviewed the above documentation for accuracy and completeness, and I agree with the above. Briscoe Deutscher, DO

## 2019-11-29 ENCOUNTER — Encounter (INDEPENDENT_AMBULATORY_CARE_PROVIDER_SITE_OTHER): Payer: Self-pay | Admitting: Family Medicine

## 2019-11-29 ENCOUNTER — Other Ambulatory Visit: Payer: Self-pay

## 2019-11-29 ENCOUNTER — Ambulatory Visit (INDEPENDENT_AMBULATORY_CARE_PROVIDER_SITE_OTHER): Payer: BC Managed Care – PPO | Admitting: Family Medicine

## 2019-11-29 VITALS — BP 117/76 | HR 76 | Temp 98.1°F | Ht 66.0 in | Wt 239.0 lb

## 2019-11-29 DIAGNOSIS — Z6838 Body mass index (BMI) 38.0-38.9, adult: Secondary | ICD-10-CM | POA: Diagnosis not present

## 2019-11-29 DIAGNOSIS — M79644 Pain in right finger(s): Secondary | ICD-10-CM

## 2019-11-29 DIAGNOSIS — L258 Unspecified contact dermatitis due to other agents: Secondary | ICD-10-CM

## 2019-11-29 MED ORDER — PHENTERMINE HCL 15 MG PO CAPS: 15.0000 mg | ORAL_CAPSULE | ORAL | 0 refills | Status: DC

## 2019-11-29 MED ORDER — TRIAMCINOLONE ACETONIDE 0.1 % EX CREA: 1.0000 "application " | TOPICAL_CREAM | Freq: Two times a day (BID) | CUTANEOUS | 0 refills | Status: DC | PRN

## 2019-11-30 LAB — CBC WITH DIFFERENTIAL/PLATELET
Basophils Absolute: 0 10*3/uL (ref 0.0–0.2)
Basos: 0 %
EOS (ABSOLUTE): 0.1 10*3/uL (ref 0.0–0.4)
Eos: 2 %
Hematocrit: 42.1 % (ref 34.0–46.6)
Hemoglobin: 13.9 g/dL (ref 11.1–15.9)
Immature Grans (Abs): 0 10*3/uL (ref 0.0–0.1)
Immature Granulocytes: 0 %
Lymphocytes Absolute: 1.7 10*3/uL (ref 0.7–3.1)
Lymphs: 23 %
MCH: 30.5 pg (ref 26.6–33.0)
MCHC: 33 g/dL (ref 31.5–35.7)
MCV: 93 fL (ref 79–97)
Monocytes Absolute: 0.6 10*3/uL (ref 0.1–0.9)
Monocytes: 8 %
Neutrophils Absolute: 4.8 10*3/uL (ref 1.4–7.0)
Neutrophils: 67 %
Platelets: 226 10*3/uL (ref 150–450)
RBC: 4.55 x10E6/uL (ref 3.77–5.28)
RDW: 12.3 % (ref 11.7–15.4)
WBC: 7.2 10*3/uL (ref 3.4–10.8)

## 2019-11-30 LAB — SEDIMENTATION RATE: Sed Rate: 3 mm/hr (ref 0–32)

## 2019-11-30 LAB — C-REACTIVE PROTEIN: CRP: 2 mg/L (ref 0–10)

## 2019-11-30 LAB — URIC ACID: Uric Acid: 3.3 mg/dL (ref 2.6–6.2)

## 2019-11-30 NOTE — Progress Notes (Signed)
Chief Complaint:   OBESITY Katie Glass is here to discuss her progress with her obesity treatment plan along with follow-up of her obesity related diagnoses. Katie Glass is on the Category 4 Plan and states she is following her eating plan approximately 85% of the time. Katie Glass states she is doing cardio and strength training for 45-60 minutes 5-6 times per week.  Today's visit was #: 9 Starting weight: 268 lbs Starting date: 09/21/2019 Today's weight: 239 lbs Today's date: 11/29/2019 Total lbs lost to date: 29 lbs Total lbs lost since last in-office visit: 3 lbs Total weight loss percentage to date: -10.82%  Interim History: Katie Glass is having pain in her right middle finger.  She says phentermine is helping with polyphagia and focus.  Assessment/Plan:   1. Pain of finger of right hand Appears inflammatory. Will check labs today. Recommend hydration and NSAIDs. Monitor for overuse.  - Uric acid - Sedimentation rate - C-reactive protein - CBC with Differential/Platelet  2. Contact dermatitis due to other agent Katie Glass is having some itching due to contact dermatitis, under her wedding band. Likely due to sweat/soap as she has increased exercise.  -Start triamcinolone cream (KENALOG) 0.1 %; Apply 1 application topically 2 (two) times daily as needed.  Dispense: 30 g; Refill: 0  3. Class 2 severe obesity with serious comorbidity and body mass index (BMI) of 38.0 to 38.9 in adult, unspecified obesity type (HCC) This patient 1) has no evidence of serious cardiovascular disease; 2) does not have serious psychiatric disease or a history of substance abuse; 3) has been informed about weight loss medications that are FDA-approved for long term use and told that these have been documented to be safe and effective whereas phentermine has not; 4) does not demonstrate a clinically significant increase in pulse or BP when taking phentermine; and 5) demonstrates significant weight loss while using the  medication. Patient understands that all anti-obesity medications are contraindicated in pregnancy. Pt denies a history of glaucoma. Patient understands that long-term use of phentermine is considered off-label use of this medication, however, that the Endocrine Society and recent research supports that long-term use of phentermine does not appear to have detrimental health effects when used in the appropriate patient. In addition, a 2019 study published in Obesity Journal on 13,972 patients concluded that "recommendations to limit phentermine to less than 3 months do not align with current concepts of pharmacologic treatment of obesity", and that "long term phentermine users experience greater weight loss without apparent increases in cardiovascular risk".  We reviewed potential side effects including insomnia, dry mouth, increased heart rate and blood pressure, increased anxiety. We reviewed reducing caffeine consumption while taking phentermine, especially if the patient is experiencing side effects. The potential risks and benefits of phentermine have been reviewed with the patient, and alternative treatment options were discussed. All questions were answered, and the patient wishes to move forward with this medication.  I have consulted the [place] Controlled Substances Registry for this patient, and feel the risk/benefit ratio today is favorable for proceeding with this prescription for a controlled substance. The patient understands monitoring parameters and red flags.   -Refill phentermine 15 MG capsule; Take 1 capsule (15 mg total) by mouth every morning.  Dispense: 30 capsule; Refill: 0  Katie Glass is currently in the action stage of change. As such, her goal is to continue with weight loss efforts. She has agreed to the Category 4 Plan.   Exercise goals: For substantial health benefits, adults should do at  least 150 minutes (2 hours and 30 minutes) a week of moderate-intensity, or 75 minutes (1 hour and  15 minutes) a week of vigorous-intensity aerobic physical activity, or an equivalent combination of moderate- and vigorous-intensity aerobic activity. Aerobic activity should be performed in episodes of at least 10 minutes, and preferably, it should be spread throughout the week.  Behavioral modification strategies: increasing water intake.  Katie Glass has agreed to follow-up with our clinic in 3 weeks. She was informed of the importance of frequent follow-up visits to maximize her success with intensive lifestyle modifications for her multiple health conditions.   Objective:   Blood pressure 117/76, pulse 76, temperature 98.1 F (36.7 C), temperature source Oral, height 5\' 6"  (1.676 m), weight 239 lb (108.4 kg), SpO2 96 %, unknown if currently breastfeeding. Body mass index is 38.58 kg/m.  General: Cooperative, alert, well developed, in no acute distress. HEENT: Conjunctivae and lids unremarkable. Cardiovascular: Regular rhythm.  Lungs: Normal work of breathing. Neurologic: No focal deficits.   Lab Results  Component Value Date   CREATININE 0.87 09/21/2019   BUN 13 09/21/2019   NA 142 09/21/2019   K 4.2 09/21/2019   CL 102 09/21/2019   CO2 22 09/21/2019   Lab Results  Component Value Date   ALT 9 09/21/2019   AST 14 09/21/2019   ALKPHOS 45 (L) 09/21/2019   BILITOT 0.6 09/21/2019   Lab Results  Component Value Date   HGBA1C 5.2 09/21/2019   HGBA1C 5.4 05/17/2019   Lab Results  Component Value Date   INSULIN 7.6 09/21/2019   INSULIN 13.2 05/17/2019   Lab Results  Component Value Date   TSH 1.350 05/17/2019   Lab Results  Component Value Date   CHOL 158 09/21/2019   HDL 58 09/21/2019   LDLCALC 85 09/21/2019   TRIG 77 09/21/2019   CHOLHDL 3.3 05/17/2019   Lab Results  Component Value Date   WBC 7.2 11/29/2019   HGB 13.9 11/29/2019   HCT 42.1 11/29/2019   MCV 93 11/29/2019   PLT 226 11/29/2019   Lab Results  Component Value Date   IRON 93 09/21/2019   TIBC  299 09/21/2019   FERRITIN 51 09/21/2019   Attestation Statements:   Reviewed by clinician on day of visit: allergies, medications, problem list, medical history, surgical history, family history, social history, and previous encounter notes.  I, 09/23/2019, CMA, am acting as transcriptionist for Insurance claims handler, DO  I have reviewed the above documentation for accuracy and completeness, and I agree with the above. Helane Rima, DO

## 2019-12-09 DIAGNOSIS — G4733 Obstructive sleep apnea (adult) (pediatric): Secondary | ICD-10-CM | POA: Diagnosis not present

## 2019-12-10 ENCOUNTER — Other Ambulatory Visit (INDEPENDENT_AMBULATORY_CARE_PROVIDER_SITE_OTHER): Payer: Self-pay | Admitting: Family Medicine

## 2019-12-10 DIAGNOSIS — E559 Vitamin D deficiency, unspecified: Secondary | ICD-10-CM

## 2019-12-12 ENCOUNTER — Encounter (INDEPENDENT_AMBULATORY_CARE_PROVIDER_SITE_OTHER): Payer: Self-pay | Admitting: Family Medicine

## 2019-12-20 ENCOUNTER — Ambulatory Visit (INDEPENDENT_AMBULATORY_CARE_PROVIDER_SITE_OTHER): Payer: BC Managed Care – PPO | Admitting: Family Medicine

## 2019-12-20 ENCOUNTER — Other Ambulatory Visit: Payer: Self-pay

## 2019-12-20 ENCOUNTER — Encounter (INDEPENDENT_AMBULATORY_CARE_PROVIDER_SITE_OTHER): Payer: Self-pay | Admitting: Family Medicine

## 2019-12-20 VITALS — BP 121/66 | HR 63 | Temp 98.0°F | Ht 66.0 in | Wt 235.0 lb

## 2019-12-20 DIAGNOSIS — E6609 Other obesity due to excess calories: Secondary | ICD-10-CM

## 2019-12-20 DIAGNOSIS — R632 Polyphagia: Secondary | ICD-10-CM | POA: Diagnosis not present

## 2019-12-20 DIAGNOSIS — E88819 Insulin resistance, unspecified: Secondary | ICD-10-CM

## 2019-12-20 DIAGNOSIS — E559 Vitamin D deficiency, unspecified: Secondary | ICD-10-CM

## 2019-12-20 DIAGNOSIS — E8881 Metabolic syndrome: Secondary | ICD-10-CM

## 2019-12-20 DIAGNOSIS — Z6837 Body mass index (BMI) 37.0-37.9, adult: Secondary | ICD-10-CM

## 2019-12-20 MED ORDER — PHENTERMINE HCL 15 MG PO CAPS: 15.0000 mg | ORAL_CAPSULE | ORAL | 0 refills | Status: DC

## 2019-12-20 NOTE — Progress Notes (Signed)
Chief Complaint:   OBESITY Katie Glass is here to discuss her progress with her obesity treatment plan along with follow-up of her obesity related diagnoses. Katie Glass is on the Category 4 Plan and states she is following her eating plan approximately 75% of the time. Katie Glass states she is doing cardio and strength training for 30-60 minutes 5 times per week.  Today's visit was #: 10 Starting weight: 268 lbs Starting date: 09/21/2019 Today's weight: 235 lbs Today's date: 12/20/2019 Total lbs lost to date: 33 lbs Total lbs lost since last in-office visit: 4 lbs Total weight loss percentage to date: -12.31%  Interim History: Katie Glass says she is drinking more Diet Coke, but is ready to focus more on water intake again. She continues to exercise regularly. Sleep is good. Work is busy but good. She is still happy with her meal plan and weight loss. Phentermine helps curb appetite. No side effects.   Assessment/Plan:   1. Polyphagia Improved. She will continue to focus on protein-rich, low simple carbohydrate foods. We reviewed the importance of hydration, regular exercise for stress reduction, and restorative sleep. Medication: Phentermine. Will continue at 15 mg dose.   Patient understands that long-term use of phentermine is considered off-label use of this medication, however, that the Endocrine Society and recent research supports that long-term use of phentermine does not appear to have detrimental health effects when used in the appropriate patient. In addition, a 2019 study published in Obesity Journal on [id number] patients concluded that "recommendations to limit phentermine to less than 3 months do not align with current concepts of pharmacologic treatment of obesity", and that "long term phentermine users experience greater weight loss without apparent increases in cardiovascular risk". The potential risks and benefits of phentermine have been reviewed with the patient, and alternative  treatment options were discussed. All questions were answered, and the patient wishes to move forward with this medication.  I have consulted the [place] Controlled Substances Registry for this patient, and feel the risk/benefit ratio today is favorable for proceeding with this prescription for a controlled substance. The patient understands monitoring parameters and red flags.   Orders - phentermine 15 MG capsule; Take 1 capsule (15 mg total) by mouth every morning.  Dispense: 30 capsule; Refill: 0  2. Vitamin D deficiency Current vitamin D is 25.0, tested on 09/21/2019. Not at goal. Optimal goal > 50 ng/dL. There is also evidence to support a goal of >70 ng/dL in patients with cancer and heart disease. Plan: Continue Vitamin D @50 ,000 IU every week with follow-up for routine testing of Vitamin D at least 2-3 times per year to avoid over-replacement.  3. Insulin resistance Improved. Goal is HgbA1c < 5.7 and insulin level closer to 5.   [place] has a diagnosis of insulin resistance based on her elevated fasting insulin level >5. She continues to work on diet and exercise to decrease her risk of diabetes.  Lab Results  Component Value Date   INSULIN 7.6 09/21/2019   INSULIN 13.2 05/17/2019   Lab Results  Component Value Date   HGBA1C 5.2 09/21/2019   During insulin resistance, several metabolic alterations induce the development of cardiovascular disease. For instance, insulin resistance can induce an imbalance in glucose metabolism that generates chronic hyperglycemia, which in turn triggers oxidative stress and causes an inflammatory response that leads to cell damage. Insulin resistance can also alter systemic lipid metabolism which then leads to the development of dyslipidemia and the well-known lipid triad: (1) high levels of  plasma triglycerides, (2) low levels of high-density lipoprotein, and (3) the appearance of small dense low-density lipoproteins. This triad, along with endothelial  dysfunction, which can also be induced by aberrant insulin signaling, contribute to atherosclerotic plaque formation.   4. Class 2 obesity due to excess calories without serious comorbidity with body mass index (BMI) of 37.0 to 37.9 in adult  Katie Glass is currently in the action stage of change. As such, her goal is to continue with weight loss efforts. She has agreed to the Category 4 Plan.   Exercise goals: For substantial health benefits, adults should do at least 150 minutes (2 hours and 30 minutes) a week of moderate-intensity, or 75 minutes (1 hour and 15 minutes) a week of vigorous-intensity aerobic physical activity, or an equivalent combination of moderate- and vigorous-intensity aerobic activity. Aerobic activity should be performed in episodes of at least 10 minutes, and preferably, it should be spread throughout the week.  Behavioral modification strategies: increasing water intake.  Katie Glass has agreed to follow-up with our clinic in 3 weeks. She was informed of the importance of frequent follow-up visits to maximize her success with intensive lifestyle modifications for her multiple health conditions.   Objective:   Blood pressure 121/66, pulse 63, temperature 98 F (36.7 C), temperature source Oral, height 5\' 6"  (1.676 m), weight 235 lb (106.6 kg), SpO2 97 %, unknown if currently breastfeeding. Body mass index is 37.93 kg/m.  General: Cooperative, alert, well developed, in no acute distress. HEENT: Conjunctivae and lids unremarkable. Cardiovascular: Regular rhythm.  Lungs: Normal work of breathing. Neurologic: No focal deficits.   Lab Results  Component Value Date   CREATININE 0.87 09/21/2019   BUN 13 09/21/2019   NA 142 09/21/2019   K 4.2 09/21/2019   CL 102 09/21/2019   CO2 22 09/21/2019   Lab Results  Component Value Date   ALT 9 09/21/2019   AST 14 09/21/2019   ALKPHOS 45 (L) 09/21/2019   BILITOT 0.6 09/21/2019   Lab Results  Component Value Date   HGBA1C  5.2 09/21/2019   HGBA1C 5.4 05/17/2019   Lab Results  Component Value Date   INSULIN 7.6 09/21/2019   INSULIN 13.2 05/17/2019   Lab Results  Component Value Date   TSH 1.350 05/17/2019   Lab Results  Component Value Date   CHOL 158 09/21/2019   HDL 58 09/21/2019   LDLCALC 85 09/21/2019   TRIG 77 09/21/2019   CHOLHDL 3.3 05/17/2019   Lab Results  Component Value Date   WBC 7.2 11/29/2019   HGB 13.9 11/29/2019   HCT 42.1 11/29/2019   MCV 93 11/29/2019   PLT 226 11/29/2019   Lab Results  Component Value Date   IRON 93 09/21/2019   TIBC 299 09/21/2019   FERRITIN 51 09/21/2019   Attestation Statements:   Reviewed by clinician on day of visit: allergies, medications, problem list, medical history, surgical history, family history, social history, and previous encounter notes.  I, 09/23/2019, CMA, am acting as transcriptionist for Insurance claims handler, DO  I have reviewed the above documentation for accuracy and completeness, and I agree with the above. Helane Rima, DO

## 2019-12-21 DIAGNOSIS — N920 Excessive and frequent menstruation with regular cycle: Secondary | ICD-10-CM | POA: Diagnosis not present

## 2020-01-01 ENCOUNTER — Other Ambulatory Visit (INDEPENDENT_AMBULATORY_CARE_PROVIDER_SITE_OTHER): Payer: Self-pay | Admitting: Family Medicine

## 2020-01-01 DIAGNOSIS — E559 Vitamin D deficiency, unspecified: Secondary | ICD-10-CM

## 2020-01-10 ENCOUNTER — Ambulatory Visit (INDEPENDENT_AMBULATORY_CARE_PROVIDER_SITE_OTHER): Payer: BC Managed Care – PPO | Admitting: Family Medicine

## 2020-01-10 ENCOUNTER — Other Ambulatory Visit: Payer: Self-pay

## 2020-01-10 ENCOUNTER — Encounter (INDEPENDENT_AMBULATORY_CARE_PROVIDER_SITE_OTHER): Payer: Self-pay | Admitting: Family Medicine

## 2020-01-10 VITALS — BP 136/87 | HR 63 | Temp 97.8°F | Ht 66.0 in | Wt 237.0 lb

## 2020-01-10 DIAGNOSIS — E559 Vitamin D deficiency, unspecified: Secondary | ICD-10-CM | POA: Diagnosis not present

## 2020-01-10 DIAGNOSIS — R632 Polyphagia: Secondary | ICD-10-CM | POA: Diagnosis not present

## 2020-01-10 DIAGNOSIS — Z6838 Body mass index (BMI) 38.0-38.9, adult: Secondary | ICD-10-CM | POA: Diagnosis not present

## 2020-01-10 DIAGNOSIS — Z9189 Other specified personal risk factors, not elsewhere classified: Secondary | ICD-10-CM | POA: Diagnosis not present

## 2020-01-10 MED ORDER — PHENTERMINE HCL 15 MG PO CAPS: 15.0000 mg | ORAL_CAPSULE | ORAL | 0 refills | Status: DC

## 2020-01-10 MED ORDER — VITAMIN D (ERGOCALCIFEROL) 1.25 MG (50000 UNIT) PO CAPS: 50000.0000 [IU] | ORAL_CAPSULE | ORAL | 0 refills | Status: DC

## 2020-01-10 NOTE — Progress Notes (Signed)
Chief Complaint:   OBESITY Katie Glass is here to discuss her progress with her obesity treatment plan along with follow-up of her obesity related diagnoses.   Today's visit was #: 11 Starting weight: 268 lbs Starting date: 09/21/2019 Today's weight: 237 lbs Today's date: 01/11/2020 Total lbs lost to date: 31 lbs Body mass index is 38.25 kg/m.  Total weight loss percentage to date: -11.57%  Interim History: Katie Glass says she has increased her exercise and is now walking with a friend, who is also in the program.  She has increased foods that she "likes to eat".  Bioimpedence shows increased muscle, decreased fat, and visceral fat rating is down to 11. Scanned into media.  Nutrition Plan: Category 4 Plan for 60% of the time. Anti-obesity medications: Phentermine 15 mg daily. Reported side effects: None. Hunger is well controlled controlled. Cravings are moderately controlled controlled.  Activity: Cardio and strength training for 45-90 minutes 6 times per week. Sleep: Sleep is restful.  Stress: Katie Glass reports some increased work stress but nothing unmanageable.  Assessment/Plan:   1. Vitamin D deficiency Current vitamin D is 25.0, tested on 09/21/2019. Not at goal. Optimal goal > 50 ng/dL.   Plan: Continue Vitamin D @50 ,000 IU every week with follow-up for routine testing of Vitamin D at least 2-3 times per year to avoid over-replacement.  - Refill Vitamin D, Ergocalciferol, (DRISDOL) 1.25 MG (50000 UNIT) CAPS capsule; Take 1 capsule (50,000 Units total) by mouth every 7 (seven) days.  Dispense: 4 capsule; Refill: 0  2. Polyphagia Hyperphagia, also called polyphagia, refers to excessive feelings of hunger, which are not relieved by eating. This is more likely to be an issues for people that have diabetes, prediabetes, or insulin resistance.   Plan:  She will continue to focus on protein-rich, low simple carbohydrate foods. We reviewed the importance of hydration, regular exercise  for stress reduction, and restorative sleep.  - Refill phentermine 15 MG capsule; Take 1 capsule (15 mg total) by mouth every morning.  Dispense: 30 capsule; Refill: 0  3. At risk for deficient intake of food Katie Glass was given approximately 15 minutes of deficit intake of food prevention counseling today. Katie Glass is at risk for eating too few calories based on current food recall. She was encouraged to focus on meeting caloric and protein goals according to her recommended meal plan.   Will consider repeat IC for calorie need.  4. Class 2 severe obesity with serious comorbidity and body mass index (BMI) of 38.0 to 38.9 in adult, unspecified obesity type Katie Glass)  Katie Glass is currently in the action stage of change. As such, her goal is to continue with weight loss efforts.   Nutrition goals: She has agreed to the Category 4 Plan.   Exercise goals: As is.  Behavioral modification strategies: increasing lean protein intake, decreasing simple carbohydrates and increasing vegetables.  Katie Glass has agreed to follow-up with our clinic in 3 weeks. She was informed of the importance of frequent follow-up visits to maximize her success with intensive lifestyle modifications for her multiple health conditions.   Objective:   Blood pressure 136/87, pulse 63, temperature 97.8 F (36.6 C), temperature source Oral, height 5\' 6"  (1.676 m), weight 237 lb (107.5 kg), SpO2 98 %, unknown if currently breastfeeding. Body mass index is 38.25 kg/m.  General: Cooperative, alert, well developed, in no acute distress. HEENT: Conjunctivae and lids unremarkable. Cardiovascular: Regular rhythm.  Lungs: Normal work of breathing. Neurologic: No focal deficits.   Lab Results  Component Value Date   CREATININE 0.87 09/21/2019   BUN 13 09/21/2019   NA 142 09/21/2019   K 4.2 09/21/2019   CL 102 09/21/2019   CO2 22 09/21/2019   Lab Results  Component Value Date   ALT 9 09/21/2019   AST 14 09/21/2019   ALKPHOS  45 (L) 09/21/2019   BILITOT 0.6 09/21/2019   Lab Results  Component Value Date   HGBA1C 5.2 09/21/2019   HGBA1C 5.4 05/17/2019   Lab Results  Component Value Date   INSULIN 7.6 09/21/2019   INSULIN 13.2 05/17/2019   Lab Results  Component Value Date   TSH 1.350 05/17/2019   Lab Results  Component Value Date   CHOL 158 09/21/2019   HDL 58 09/21/2019   LDLCALC 85 09/21/2019   TRIG 77 09/21/2019   CHOLHDL 3.3 05/17/2019   Lab Results  Component Value Date   WBC 7.2 11/29/2019   HGB 13.9 11/29/2019   HCT 42.1 11/29/2019   MCV 93 11/29/2019   PLT 226 11/29/2019   Lab Results  Component Value Date   IRON 93 09/21/2019   TIBC 299 09/21/2019   FERRITIN 51 09/21/2019   Attestation Statements:   Reviewed by clinician on day of visit: allergies, medications, problem list, medical history, surgical history, family history, social history, and previous encounter notes.  I, Insurance claims handler, CMA, am acting as transcriptionist for Helane Rima, DO  I have reviewed the above documentation for accuracy and completeness, and I agree with the above. Helane Rima, DO

## 2020-01-23 DIAGNOSIS — Z30430 Encounter for insertion of intrauterine contraceptive device: Secondary | ICD-10-CM | POA: Diagnosis not present

## 2020-01-23 DIAGNOSIS — Z3202 Encounter for pregnancy test, result negative: Secondary | ICD-10-CM | POA: Diagnosis not present

## 2020-01-29 ENCOUNTER — Encounter: Payer: Self-pay | Admitting: Family Medicine

## 2020-01-31 ENCOUNTER — Encounter (INDEPENDENT_AMBULATORY_CARE_PROVIDER_SITE_OTHER): Payer: Self-pay | Admitting: Family Medicine

## 2020-01-31 ENCOUNTER — Ambulatory Visit (INDEPENDENT_AMBULATORY_CARE_PROVIDER_SITE_OTHER): Payer: BC Managed Care – PPO | Admitting: Family Medicine

## 2020-01-31 ENCOUNTER — Other Ambulatory Visit: Payer: Self-pay

## 2020-01-31 VITALS — BP 137/83 | HR 64 | Temp 98.0°F | Ht 66.0 in | Wt 236.0 lb

## 2020-01-31 DIAGNOSIS — M25572 Pain in left ankle and joints of left foot: Secondary | ICD-10-CM

## 2020-01-31 DIAGNOSIS — E559 Vitamin D deficiency, unspecified: Secondary | ICD-10-CM | POA: Diagnosis not present

## 2020-01-31 DIAGNOSIS — Z9189 Other specified personal risk factors, not elsewhere classified: Secondary | ICD-10-CM

## 2020-01-31 DIAGNOSIS — F5081 Binge eating disorder: Secondary | ICD-10-CM

## 2020-01-31 DIAGNOSIS — Z6838 Body mass index (BMI) 38.0-38.9, adult: Secondary | ICD-10-CM

## 2020-01-31 MED ORDER — LISDEXAMFETAMINE DIMESYLATE 20 MG PO CAPS: 20.0000 mg | ORAL_CAPSULE | Freq: Every day | ORAL | 0 refills | Status: DC

## 2020-01-31 MED ORDER — VITAMIN D (ERGOCALCIFEROL) 1.25 MG (50000 UNIT) PO CAPS: 50000.0000 [IU] | ORAL_CAPSULE | ORAL | 0 refills | Status: DC

## 2020-01-31 NOTE — Progress Notes (Signed)
[name] [address] [place] [address] Phone: [phone] Subjective:   [name], am serving as a scribe for Dr. Antoine Primas. This visit occurred during the SARS-CoV-2 public health emergency.  Safety protocols were in place, including screening questions prior to the visit, additional usage of staff PPE, and extensive cleaning of exam room while observing appropriate contact time as indicated for disinfecting solutions.   I'm seeing this patient by the request  of:  [name], MD  CC: Left foot pain  BSJ:[id number]   08/18/2019 Lisfranc seems to be doing very well.  Able to start walking.  Did have a mild abnormal   Update 02/01/2020 Katie Glass is a 44 y.o. female coming in with complaint of left foot pain. Patient states that both feet have been bothering her more than last visit. Has been doing boot camp and walking 3 miles a day. Is walking faster than she use to be walking.   Pain is over transverse arch in both feet over navicular. L>R.       Past Medical History:  Diagnosis Date  . Arthritis    knees  . Asthma    as a child  . Back pain   . Food allergy    Tannin/sulfate in [employer] and c[employer], rash on face and hands  . Joint pain   . Lower extremity edema   . MMT (medial meniscus tear)    right  . Sleep apnea    CPAP nightly  . Umbilical hernia    Past Surgical History:  Procedure Laterality Date  . BILATERAL SALPINGECTOMY Bilateral 04/15/2018   Procedure: BILATERAL SALPINGECTOMY;  Surgeon: Carrington Clamp, MD;  Location: [facility];  Service: Obstetrics;  Laterality: Bilateral;  . CESAREAN SECTION  11/12/2010   Procedure: CESAREAN SECTION;  Surgeon: Almon Hercules;  Location: [facility];  Service: Gynecology;  Laterality: N/A;  . CESAREAN SECTION N/A 04/15/2018   Procedure: CESAREAN SECTION;  Surgeon: Carrington Clamp, MD;  Location: [facility];  Service: Obstetrics;  Laterality: N/A;  .  KNEE ARTHROSCOPY Right 02/11/2017   Procedure: RIGHT KNEE ARTHROSCOPY WITH DEBRIDEMENT CHONDROMALACIA;  Surgeon: Gean Birchwood, MD;  Location: [facility];  Service: Orthopedics;  Laterality: Right;   Social History   Socioeconomic History  . Marital status: Married    Spouse name: Zhavia Cunanan  . Number of children: Not on file  . Years of education: Not on file  . Highest education level: Not on file  Occupational History  . Occupation: Pastor  Tobacco Use  . Smoking status: Never Smoker  . Smokeless tobacco: Never Used  Vaping Use  . Vaping Use: Never used  Substance and Sexual Activity  . Alcohol use: Yes    Comment: social  . Drug use: No  . Sexual activity: Yes    Birth control/protection: None  Other Topics Concern  . Not on file  Social History Narrative  . Not on file   Social Determinants of Health   Financial Resource Strain:   . Difficulty of Paying Living Expenses: Not on file  Food Insecurity:   . Worried About Programme researcher, broadcasting/film/video in the Last Year: Not on file  . Ran Out of Food in the Last Year: Not on file  Transportation Needs:   . Lack of Transportation (Medical): Not on file  . Lack of Transportation (Non-Medical): Not on file  Physical Activity:   . Days of Exercise per Week: Not  on file  . Minutes of Exercise per Session: Not on file  Stress:   . Feeling of Stress : Not on file  Social Connections:   . Frequency of Communication with Friends and Family: Not on file  . Frequency of Social Gatherings with Friends and Family: Not on file  . Attends Religious Services: Not on file  . Active Member of Clubs or Organizations: Not on file  . Attends Banker Meetings: Not on file  . Marital Status: Not on file   No Known Allergies Family History  Problem Relation Age of Onset  . Hypertension Father   . Hyperlipidemia Father   . Heart disease Father   . Cancer Maternal Grandfather   . Heart disease Paternal  Grandfather   . Pulmonary embolism Mother   . Alcohol abuse Mother       Current Outpatient Medications (Respiratory):  .  cetirizine (ZYRTEC) 10 MG tablet, Take 10 mg by mouth daily. .  fluticasone (FLONASE) 50 MCG/ACT nasal spray, Place 1 spray into both nostrils daily.  Current Outpatient Medications (Analgesics):  .  acetaminophen (TYLENOL) 500 MG tablet, Take 500 mg by mouth every 6 (six) hours as needed. Marland Kitchen  ibuprofen (ADVIL) 200 MG tablet, Take 200 mg by mouth every 6 (six) hours as needed.  Current Outpatient Medications (Hematological):  .  tranexamic acid (LYSTEDA) 650 MG TABS tablet, Take 1,300 mg by mouth. Take one tablet by mouth three times a day during menses  Current Outpatient Medications (Other):  .  lisdexamfetamine (VYVANSE) 20 MG capsule, Take 1 capsule (20 mg total) by mouth daily. .  Multiple Vitamins-Minerals (WOMENS MULTI VITAMIN & MINERAL PO), Take 1 tablet by mouth daily. .  phentermine 15 MG capsule, Take 1 capsule (15 mg total) by mouth every morning. .  triamcinolone cream (KENALOG) 0.1 %, Apply 1 application topically 2 (two) times daily as needed. .  Vitamin D, Ergocalciferol, (DRISDOL) 1.25 MG (50000 UNIT) CAPS capsule, Take 1 capsule (50,000 Units total) by mouth every 7 (seven) days.   Reviewed prior external information including notes and imaging from  primary care provider As well as notes that were available from care everywhere and other healthcare systems.  Past medical history, social, surgical and family history all reviewed in electronic medical record.  No pertanent information unless stated regarding to the chief complaint.   Review of Systems:  No headache, visual changes, nausea, vomiting, diarrhea, constipation, dizziness, abdominal pain, skin rash, fevers, chills, night sweats, weight loss, swollen lymph nodes, body aches, joint swelling, chest pain, shortness of breath, mood changes. POSITIVE muscle aches  Objective  Blood pressure  128/84, pulse 84, height 5\' 6"  (1.676 m), weight 240 lb (108.9 kg), last menstrual period 01/31/2020, SpO2 97 %, unknown if currently breastfeeding.   General: No apparent distress alert and oriented x3 mood and affect normal, dressed appropriately.  HEENT: Pupils equal, extraocular movements intact  Respiratory: Patient's speak in full sentences and does not appear short of breath  Cardiovascular: No lower extremity edema, non tender, no erythema  Left foot exam does have a rigid midfoot.  Patient is tender to palpation over the midfoot on the left foot.  Patient's right foot shows some mild hallux limitus of the first toe. Patient's ankle on the left side still has some mild decrease in plantarflexion and dorsiflexion of only 5 which is an improvement from previous exam.  No swelling of the ankle joint noted.  Limited musculoskeletal ultrasound was performed and interpreted  by Judi Saa  Limited ultrasound of patient's midfoot shows that patient does have arthritic changes of the midfoot but also does have what appears to be a stress reaction with a cortical irregularity of the first and second metatarsals with increasing neovascularization in Doppler flow. Impression: Stress fracture left foot    Impression and Recommendations:     The above documentation has been reviewed and is accurate and complete Judi Saa, DO

## 2020-01-31 NOTE — Progress Notes (Signed)
Chief Complaint:   OBESITY Katie Glass is here to discuss her progress with her obesity treatment plan along with follow-up of her obesity related diagnoses.   Today's visit was #: 12 Starting weight: 268 lbs Starting date: 09/21/2019 Today's weight: 236 lbs Today's date: 01/31/2020 Total lbs lost to date: 32 lbs Body mass index is 38.09 kg/m.  Total weight loss percentage to date: -11.94%  Nutrition Plan: the Category 4 Plan.  Anti-obesity medications: Phentermine. Reported side effects: None. Hunger is moderately controlled controlled. Cravings are moderately controlled controlled.  Activity: Walking for 60-90 minutes 4 times per week and doing [employer] for 45 minutes 3-4 times per week. Barriers: She says that the OA in her left ankle is worse. Work is busy.  Assessment/Plan:   1. Vitamin D deficiency Not at goal. Current vitamin D is 25.0, tested on 09/21/2019. Optimal goal > 50 ng/dL.   Plan:  _0   Continue Vitamin D _1 ,000 IU every week. _2   Continue home supplement daily. _3   Follow-up for routine testing of Vitamin D at least 2-3 times per year to avoid over-replacement.  - Refill Vitamin D, Ergocalciferol, (DRISDOL) 1.25 MG (50000 UNIT) CAPS capsule; Take 1 capsule (50,000 Units total) by mouth every 7 (seven) days.  Dispense: 4 capsule; Refill: 0  2. Left ankle pain She is being followed by Sports Medicine. We will continue to monitor symptoms as they relate to her weight loss journey.  3. Binge eating disorder BED Symptoms  Criterion A: Recurrent episodes of binge eating. An episode of binge eating is characterized by both of the following: _4   Eating, in a discrete period of time (e.g., within any 2- hour period), an amount of food that is definitely larger than what most people would eat in a similar period of time under similar circumstances. _5   A sense of lack of control over eating during the episode (e.g., a feeling that one cannot stop eating or  control what or how much one is eating).  Criterion B: _6   The binge-eating episodes are associated with three (or more) of the following: _7   Eating much more rapidly than normal. _8   Eating until feeling uncomfortably full. _9   Eating large amounts of food when not feeling physically hungry. _10   Eating alone because of feeling embarrassed by how much one is eating. _11   Feeling disgusted with oneself, depressed, or very guilty afterward.  Criterion C: _12   Marked distress regarding binge eating is present.  Criterion D: _13   The binge eating occurs, on average, at least once a week for 3 months.  Criterion E: _14   The binge eating is not associated with the recurrent use of inappropriate compensatory behavior as in bulimia nervosa and does not occur exclusively during the course of bulimia nervosa or anorexia nervosa.  Specify if: _15   In partial remission: After full criteria for binge-eating disorder were previously met, binge eating occurs at an average frequency of less than one episode per week for a sustained period of time. _16   In full remission: After full criteria for binge-eating disorder were previously met, none of the criteria have been met for a sustained period of time. _17   NOT in remission  People who binge eat feel as if they don't have control over how much they eat and have feelings of guilt or self-loathing after a binge eating episode. [facility] estimates that about 30 percent of adults with binge eating disorder also have a history of ADHD. The  FDA has approved Vyvanse as a treatment option for both ADHD and binge eating. Vyvanse targets the brain's reward center by increasing the levels of dopamine and norepinephrine, the chemicals of the brain responsible for feelings of pleasure. Mindful eating is the recommended nutritional approach to treating BED.   After discussion, patient would like to start below medication. Expectations, risks, and potential side  effects reviewed.   - Start lisdexamfetamine (VYVANSE) 20 MG capsule; Take 1 capsule (20 mg total) by mouth daily.  Dispense: 30 capsule; Refill: 0  I have consulted the [place] Controlled Substances Registry for this patient, and feel the risk/benefit ratio today is favorable for proceeding with this prescription for a controlled substance. The patient understands monitoring parameters and red flags.   4. At risk for activity intolerance Tareva was given approximately 15 minutes of exercise intolerance counseling today. She is 44 y.o. female and has risk factors exercise intolerance including obesity and ankle pain. We discussed intensive lifestyle modifications today with an emphasis on specific weight loss instructions and strategies. Addie will slowly increase activity as tolerated.  5. Class 2 severe obesity with serious comorbidity and body mass index (BMI) of 38.0 to 38.9 in adult, unspecified obesity type ([place])  Course: Katie Glass is currently in the action stage of change. As such, her goal is to continue with weight loss efforts.   Nutrition goals: She has agreed to the Category 4 Plan.   Exercise goals: For substantial health benefits, adults should do at least 150 minutes (2 hours and 30 minutes) a week of moderate-intensity, or 75 minutes (1 hour and 15 minutes) a week of vigorous-intensity aerobic physical activity, or an equivalent combination of moderate- and vigorous-intensity aerobic activity. Aerobic activity should be performed in episodes of at least 10 minutes, and preferably, it should be spread throughout the week.  Behavioral modification strategies: increasing lean protein intake, decreasing simple carbohydrates, increasing vegetables, increasing water intake and emotional eating strategies.  Katie Glass has agreed to follow-up with our clinic in 3 weeks. She was informed of the importance of frequent follow-up visits to maximize her success with intensive lifestyle modifications  for her multiple health conditions.   Objective:   Blood pressure 137/83, pulse 64, temperature 98 F (36.7 C), temperature source Oral, height $RemoveBefo'5\' 6"'TDBQDstJDpH$  (1.676 m), weight 236 lb (107 kg), last menstrual period 01/31/2020, SpO2 100 %, unknown if currently breastfeeding. Body mass index is 38.09 kg/m.  General: Cooperative, alert, well developed, in no acute distress. HEENT: Conjunctivae and lids unremarkable. Cardiovascular: Regular rhythm.  Lungs: Normal work of breathing. Neurologic: No focal deficits.   Lab Results  Component Value Date   CREATININE 0.87 09/21/2019   BUN 13 09/21/2019   NA 142 09/21/2019   K 4.2 09/21/2019   CL 102 09/21/2019   CO2 22 09/21/2019   Lab Results  Component Value Date   ALT 9 09/21/2019   AST 14 09/21/2019   ALKPHOS 45 (L) 09/21/2019   BILITOT 0.6 09/21/2019   Lab Results  Component Value Date   HGBA1C 5.2 09/21/2019   HGBA1C 5.4 05/17/2019   Lab Results  Component Value Date   INSULIN 7.6 09/21/2019   INSULIN 13.2 05/17/2019   Lab Results  Component Value Date   TSH 1.350 05/17/2019   Lab Results  Component Value Date   CHOL 158 09/21/2019   HDL 58 09/21/2019   LDLCALC 85 09/21/2019   TRIG 77 09/21/2019   CHOLHDL 3.3 05/17/2019   Lab Results  Component Value Date  WBC 7.2 11/29/2019   HGB 13.9 11/29/2019   HCT 42.1 11/29/2019   MCV 93 11/29/2019   PLT 226 11/29/2019   Lab Results  Component Value Date   IRON 93 09/21/2019   TIBC 299 09/21/2019   FERRITIN 51 09/21/2019   Attestation Statements:   Reviewed by clinician on day of visit: allergies, medications, problem list, medical history, surgical history, family history, social history, and previous encounter notes.  I, Water quality scientist, CMA, am acting as transcriptionist for Briscoe Deutscher, DO  I have reviewed the above documentation for accuracy and completeness, and I agree with the above. Briscoe Deutscher, DO

## 2020-02-01 ENCOUNTER — Ambulatory Visit: Payer: Self-pay

## 2020-02-01 ENCOUNTER — Ambulatory Visit: Payer: BC Managed Care – PPO | Admitting: Family Medicine

## 2020-02-01 ENCOUNTER — Encounter: Payer: Self-pay | Admitting: Family Medicine

## 2020-02-01 ENCOUNTER — Ambulatory Visit: Payer: BC Managed Care – PPO

## 2020-02-01 VITALS — BP 128/84 | HR 84 | Ht 66.0 in | Wt 240.0 lb

## 2020-02-01 DIAGNOSIS — M84375A Stress fracture, left foot, initial encounter for fracture: Secondary | ICD-10-CM

## 2020-02-01 DIAGNOSIS — M25572 Pain in left ankle and joints of left foot: Secondary | ICD-10-CM | POA: Diagnosis not present

## 2020-02-01 DIAGNOSIS — G8929 Other chronic pain: Secondary | ICD-10-CM

## 2020-02-01 NOTE — Assessment & Plan Note (Signed)
Patient is increasing her activity again at this time.  I do believe the patient has a stress fracture with patient having a rigid midfoot.  We will have patient be put in with custom orthotics, continue vitamin D, icing regimen, and walker with significant amount of activity.  Discussed topical anti-inflammatories.  Follow-up again 4 to 6 weeks

## 2020-02-01 NOTE — Patient Instructions (Signed)
Continue once weekly Vit D K2 daily for 4 weeks Ice after activity With walking go back to the boot Voltaren may help right toe pain OOFOS or HOKA recovery sandals Orthotics at [facility] office See me in 4-5 weeks

## 2020-02-02 ENCOUNTER — Encounter (INDEPENDENT_AMBULATORY_CARE_PROVIDER_SITE_OTHER): Payer: Self-pay | Admitting: Family Medicine

## 2020-02-06 ENCOUNTER — Encounter: Payer: Self-pay | Admitting: Family Medicine

## 2020-02-07 ENCOUNTER — Other Ambulatory Visit: Payer: Self-pay

## 2020-02-07 DIAGNOSIS — M84375A Stress fracture, left foot, initial encounter for fracture: Secondary | ICD-10-CM

## 2020-02-07 NOTE — Progress Notes (Unsigned)
bne den

## 2020-02-07 NOTE — Telephone Encounter (Signed)
Patient has been scheduled for the bone density.

## 2020-02-09 ENCOUNTER — Inpatient Hospital Stay: Admission: RE | Admit: 2020-02-09 | Payer: BC Managed Care – PPO | Source: Ambulatory Visit

## 2020-02-09 ENCOUNTER — Other Ambulatory Visit: Payer: Self-pay

## 2020-02-09 ENCOUNTER — Encounter: Payer: Self-pay | Admitting: Family Medicine

## 2020-02-09 ENCOUNTER — Ambulatory Visit: Payer: BC Managed Care – PPO | Admitting: Family Medicine

## 2020-02-09 DIAGNOSIS — S93622A Sprain of tarsometatarsal ligament of left foot, initial encounter: Secondary | ICD-10-CM

## 2020-02-09 DIAGNOSIS — M84375A Stress fracture, left foot, initial encounter for fracture: Secondary | ICD-10-CM

## 2020-02-14 ENCOUNTER — Ambulatory Visit (INDEPENDENT_AMBULATORY_CARE_PROVIDER_SITE_OTHER): Payer: BC Managed Care – PPO | Admitting: Physical Therapy

## 2020-02-14 ENCOUNTER — Other Ambulatory Visit: Payer: Self-pay

## 2020-02-14 DIAGNOSIS — M25572 Pain in left ankle and joints of left foot: Secondary | ICD-10-CM | POA: Diagnosis not present

## 2020-02-20 ENCOUNTER — Encounter: Payer: Self-pay | Admitting: Physical Therapy

## 2020-02-20 ENCOUNTER — Other Ambulatory Visit: Payer: Self-pay

## 2020-02-20 ENCOUNTER — Ambulatory Visit: Payer: BC Managed Care – PPO | Admitting: Physical Therapy

## 2020-02-20 DIAGNOSIS — M25572 Pain in left ankle and joints of left foot: Secondary | ICD-10-CM | POA: Diagnosis not present

## 2020-02-20 NOTE — Therapy (Signed)
[facility] [address] [place], [place], [address] Phone: [phone]   Fax:  [phone]  Physical Therapy Evaluation  Patient Details  Name: Katie Glass MRN: [id number] Date of Birth: 09/23/1975 Referring Provider (PT): Charlann Boxer   Encounter Date: 02/14/2020   PT End of Session - 02/20/20 1037    Visit Number 1    Number of Visits 12    Date for PT Re-Evaluation 03/27/20    Authorization Type BCBS    PT Start Time 0850    PT Stop Time 0930    PT Time Calculation (min) 40 min    Activity Tolerance Patient tolerated treatment well    Behavior During Therapy [facility] for tasks assessed/performed           Past Medical History:  Diagnosis Date  . Arthritis    knees  . Asthma    as a child  . Back pain   . Food allergy    Tannin/sulfate in [place] and ciders, rash on face and hands  . Joint pain   . Lower extremity edema   . MMT (medial meniscus tear)    right  . Sleep apnea    CPAP nightly  . Umbilical hernia     Past Surgical History:  Procedure Laterality Date  . BILATERAL SALPINGECTOMY Bilateral 04/15/2018   Procedure: BILATERAL SALPINGECTOMY;  Surgeon: Bobbye [facility], MD;  Location: [place];  Service: Obstetrics;  Laterality: Bilateral;  . CESAREAN SECTION  11/12/2010   Procedure: CESAREAN SECTION;  Surgeon: Marcial Pacas;  Location: [facility];  Service: Gynecology;  Laterality: N/A;  . CESAREAN SECTION N/A 04/15/2018   Procedure: CESAREAN SECTION;  Surgeon: Bobbye Charleston, MD;  Location: [facility];  Service: Obstetrics;  Laterality: N/A;  . KNEE ARTHROSCOPY Right 02/11/2017   Procedure: RIGHT KNEE ARTHROSCOPY WITH DEBRIDEMENT CHONDROMALACIA;  Surgeon: Frederik Pear, MD;  Location: [facility];  Service: Orthopedics;  Laterality: Right;    There were no vitals filed for this visit.    Subjective Assessment - 02/20/20 1031    Subjective Pt had L lisfranc fx, now having pain  in metatarsals, 5th, since June. She is currently wearing CAM boot due to stress fx on L. She is having bone density next week. Pt works as Theme park manager, very active outside of work, likes to exercise, Pt referred for orthotics    Pertinent History stress fx.    Currently in Pain? Yes    Pain Score 4     Pain Location Foot    Pain Orientation Left    Pain Descriptors / Indicators Aching    Pain Type Acute pain    Pain Onset More than a month ago    Pain Frequency Intermittent    Aggravating Factors  standing, walking              [facility] PT Assessment - 02/20/20 0001      Assessment   Medical Diagnosis L foot pain, stress fx    Referring Provider (PT) Charlann Boxer    Prior Therapy no      Balance Screen   Has the patient fallen in the past 6 months No      Prior Function   Level of Independence Independent      Cognition   Overall Cognitive Status Within Functional Limits for tasks assessed      Posture/Postural Control   Posture Comments Mild arch height loss R>L. Wide forefoot  ROM / Strength   AROM / PROM / Strength AROM;Strength      AROM   Overall AROM Comments WFL bil foot/ankle, LE WNL       Strength   Overall Strength Comments mild strength deficit inv 4/5 bil;        Palpation   Palpation comment Mild soreness at base of 5th met (L) Mild stiffness with DF bil                      Objective measurements completed on examination: See above findings.       [facility] Adult PT Treatment/Exercise - 02/20/20 0001      Self-Care   Self-Care Other Self-Care Comments    Other Self-Care Comments  custom orthotics fabricated:  sz 11 blank,  with EVA foam.                   PT Education - 02/20/20 1035    Education Details Discussed orthotic use, precautions, wear time. Discussed footwear appropriate for foot type, dress shoes    Person(s) Educated Patient    Methods Explanation;Demonstration;Tactile cues;Verbal cues;Handout    Comprehension  Verbalized understanding;Returned demonstration;Verbal cues required;Tactile cues required;Need further instruction               PT Long Term Goals - 02/20/20 1040      PT LONG TERM GOAL #1   Title Pt to be compliant with foot wear and orthotics as appropriate for her foot type    Time 6    Period Weeks    Status New    Target Date 03/28/19      PT LONG TERM GOAL #2   Title Pt to have good fit, comfort and tolerance of orthotics for improved foot support, alignment, and pain    Time 6    Period Weeks    Status New    Target Date 03/28/19                  Plan - 02/20/20 1053    Clinical Impression Statement Pt with soreness from stress fracture(s) in L foot. Pt referred for orthotics for increased foot stability in shoe. Custom orthotics fabricated today. Discussed other footwear appropriate for pts foot type, with wide width and wider toe box. Pt not in need of other skilled PT at this time, will f/u with MD in future for bone density and healing of fxs. Pt will return for orthotic check next visit.    Examination-Activity Limitations Stand    Examination-Participation Restrictions Occupation;Community Activity    Stability/Clinical Decision Making Stable/Uncomplicated    Clinical Decision Making Low    Rehab Potential Good    PT Frequency 1x / week    PT Duration 6 weeks    PT Treatment/Interventions ADLs/Self Care Home Management;Cryotherapy;Electrical Stimulation;Ultrasound;Traction;Moist Heat;Iontophoresis 4mg /ml Dexamethasone;Gait training;Stair training;Functional mobility training;Therapeutic activities;Therapeutic exercise;Orthotic Fit/Training;Patient/family education;Neuromuscular re-education;Balance training;Manual techniques;Taping;Dry needling;Passive range of motion;Spinal Manipulations;Joint Manipulations    Consulted and Agree with Plan of Care Patient           Patient will benefit from skilled therapeutic intervention in order to improve the  following deficits and impairments:  Pain, Decreased mobility, Decreased activity tolerance, Decreased strength  Visit Diagnosis: Pain in left ankle and joints of left foot     Problem List Patient Active Problem List   Diagnosis Date Noted  . Stress fracture of left foot 02/01/2020  . Lisfranc's sprain, left, initial encounter 07/15/2019  . Acute  medial meniscus tear of right knee 02/10/2017    Lyndee Hensen, PT, DPT 10:58 AM  02/20/20    [facility] [place], [place], [address] Phone: [phone]   Fax:  [phone]  Name: Katie Glass MRN: [id number] Date of Birth: 09/03/1975

## 2020-02-20 NOTE — Therapy (Addendum)
[facility] [address] [place], [place], [address] Phone: [phone]   Fax:  [phone]  Physical Therapy Treatment  Patient Details  Name: Katie Glass MRN: [id number] Date of Birth: 1975/11/27 Referring Provider (PT): Charlann Boxer   Encounter Date: 02/20/2020   PT End of Session - 02/20/20 1100    Visit Number 2    Number of Visits 12    Date for PT Re-Evaluation 03/27/20    Authorization Type BCBS    PT Start Time 0805    PT Stop Time 0840    PT Time Calculation (min) 35 min    Activity Tolerance Patient tolerated treatment well    Behavior During Therapy [employer] for tasks assessed/performed           Past Medical History:  Diagnosis Date  . Arthritis    knees  . Asthma    as a child  . Back pain   . Food allergy    Tannin/sulfate in [place] and ciders, rash on face and hands  . Joint pain   . Lower extremity edema   . MMT (medial meniscus tear)    right  . Sleep apnea    CPAP nightly  . Umbilical hernia     Past Surgical History:  Procedure Laterality Date  . BILATERAL SALPINGECTOMY Bilateral 04/15/2018   Procedure: BILATERAL SALPINGECTOMY;  Surgeon: Bobbye Charleston, MD;  Location: [facility];  Service: Obstetrics;  Laterality: Bilateral;  . CESAREAN SECTION  11/12/2010   Procedure: CESAREAN SECTION;  Surgeon: Marcial Pacas;  Location: [facility];  Service: Gynecology;  Laterality: N/A;  . CESAREAN SECTION N/A 04/15/2018   Procedure: CESAREAN SECTION;  Surgeon: Bobbye Charleston, MD;  Location: [facility];  Service: Obstetrics;  Laterality: N/A;  . KNEE ARTHROSCOPY Right 02/11/2017   Procedure: RIGHT KNEE ARTHROSCOPY WITH DEBRIDEMENT CHONDROMALACIA;  Surgeon: Frederik Pear, MD;  Location: [facility];  Service: Orthopedics;  Laterality: Right;    There were no vitals filed for this visit.   Subjective Assessment - 02/20/20 1059    Subjective Pt here today for orthotic check/fit.  States increased soreness in foot, has been wearing boot, due to a lot of standing yesterday.    Currently in Pain? Yes    Pain Location Foot    Pain Orientation Left    Pain Descriptors / Indicators Aching;Sore    Pain Type Acute pain    Pain Onset More than a month ago    Pain Frequency Intermittent                             [facility] Adult PT Treatment/Exercise - 02/20/20 1219      Self-Care   Other Self-Care Comments  Orthotic check, re-grinding of R side for improved comfort;  Discussed other shoe types, brands, for wide width and wider toe box.                        PT Long Term Goals - 02/20/20 1040      PT LONG TERM GOAL #1   Title Pt to be compliant with foot wear and orthotics as appropriate for her foot type    Time 6    Period Weeks    Status New    Target Date 03/28/19      PT LONG TERM GOAL #2   Title Pt to have good fit, comfort and tolerance  of orthotics for improved foot support, alignment, and pain    Time 6    Period Weeks    Status New    Target Date 03/28/19                 Plan - 02/20/20 1102    Clinical Impression Statement Orthotics fitted in Pts current footwear, good fit in shoe. Mild grinding of R orthotic for decreasing mid arch pressure. Discussed wear time, and use. Pt will continue to wear, will return for additional check as needed for comfort. Discussed other footwear, dress shoes for work, and importance of wider shoe and wider toe box for less compression of metatarsals.    Examination-Activity Limitations Stand    Examination-Participation Restrictions Occupation;Community Activity    Stability/Clinical Decision Making Stable/Uncomplicated    Rehab Potential Good    PT Frequency 1x / week    PT Duration 6 weeks    PT Treatment/Interventions ADLs/Self Care Home Management;Cryotherapy;Electrical Stimulation;Ultrasound;Traction;Moist Heat;Iontophoresis 52m/ml Dexamethasone;Gait training;Stair  training;Functional mobility training;Therapeutic activities;Therapeutic exercise;Orthotic Fit/Training;Patient/family education;Neuromuscular re-education;Balance training;Manual techniques;Taping;Dry needling;Passive range of motion;Spinal Manipulations;Joint Manipulations    Consulted and Agree with Plan of Care Patient           Patient will benefit from skilled therapeutic intervention in order to improve the following deficits and impairments:  Pain, Decreased mobility, Decreased activity tolerance, Decreased strength  Visit Diagnosis: Pain in left ankle and joints of left foot     Problem List Patient Active Problem List   Diagnosis Date Noted  . Stress fracture of left foot 02/01/2020  . Lisfranc's sprain, left, initial encounter 07/15/2019  . Acute medial meniscus tear of right knee 02/10/2017    LLyndee Hensen PT, DPT 12:21 PM  02/20/20    [facility] [address]Phone: [phone]  Fax:  [phone] Name: Katie FobesMRN: [id number] of Birth: 7September 12, 1977  PHYSICAL THERAPY DISCHARGE SUMMARY Visits since Eval:  2 Plan: Patient agrees to discharge.  Patient goals were met. Patient is being discharged due to meeting the stated rehab goals.  ?????       LLyndee Hensen PT, DPT 11:20 AM  07/18/20

## 2020-02-21 ENCOUNTER — Ambulatory Visit (INDEPENDENT_AMBULATORY_CARE_PROVIDER_SITE_OTHER): Payer: BC Managed Care – PPO | Admitting: Family Medicine

## 2020-02-21 ENCOUNTER — Encounter (INDEPENDENT_AMBULATORY_CARE_PROVIDER_SITE_OTHER): Payer: Self-pay | Admitting: Family Medicine

## 2020-02-21 VITALS — BP 138/76 | HR 74 | Temp 98.5°F | Ht 66.0 in | Wt 230.0 lb

## 2020-02-21 DIAGNOSIS — Z9189 Other specified personal risk factors, not elsewhere classified: Secondary | ICD-10-CM

## 2020-02-21 DIAGNOSIS — Z6837 Body mass index (BMI) 37.0-37.9, adult: Secondary | ICD-10-CM

## 2020-02-21 DIAGNOSIS — E559 Vitamin D deficiency, unspecified: Secondary | ICD-10-CM

## 2020-02-21 DIAGNOSIS — M84375A Stress fracture, left foot, initial encounter for fracture: Secondary | ICD-10-CM | POA: Diagnosis not present

## 2020-02-21 DIAGNOSIS — E6609 Other obesity due to excess calories: Secondary | ICD-10-CM | POA: Diagnosis not present

## 2020-02-21 DIAGNOSIS — F5081 Binge eating disorder: Secondary | ICD-10-CM

## 2020-02-21 MED ORDER — VITAMIN D (ERGOCALCIFEROL) 1.25 MG (50000 UNIT) PO CAPS: 50000.0000 [IU] | ORAL_CAPSULE | ORAL | 0 refills | Status: DC

## 2020-02-21 MED ORDER — VYVANSE 40 MG PO CHEW: 1.0000 | CHEWABLE_TABLET | Freq: Every day | ORAL | 0 refills | Status: DC

## 2020-02-21 NOTE — Progress Notes (Signed)
Chief Complaint:   OBESITY Katie Glass is here to discuss her progress with her obesity treatment plan along with follow-up of her obesity related diagnoses.   Today's visit was #: 13 Starting weight: 268 lbs Starting date: 09/21/2019 Today's weight: 230 Today's date: 02/21/2020 Total lbs lost to date: 38 lbs Body mass index is 37.12 kg/m.  Total weight loss percentage to date: -14.18%  Interim History: Katie Glass now has a boot on her left leg.  She says that Vyvanse is helping to control polyphagia and cravings. Nutrition Plan: the Category 4 Plan for 75% of the time.  Activity:  She is doing less walking/body strengthening at this time.  Assessment/Plan:   1. Stress fracture of left foot, initial encounter Katie Glass is currently in a boot for this.  She has a DEXA scan scheduled for tomorrow.  2. Vitamin D deficiency Not at goal. Current vitamin D is 25.0, tested on 09/21/2019. Optimal goal > 50 ng/dL.   Plan:  [x]   Continue Vitamin D @50 ,000 IU every week. []   Continue home supplement daily. [x]   Follow-up for routine testing of Vitamin D at least 2-3 times per year to avoid over-replacement.  - Refill Vitamin D, Ergocalciferol, (DRISDOL) 1.25 MG (50000 UNIT) CAPS capsule; Take 1 capsule (50,000 Units total) by mouth every 7 (seven) days.  Dispense: 4 capsule; Refill: 0  3. Binge eating disorder Katie Glass is taking Vyvanse 40 mg daily and says that it is helping with her symptoms.  People who binge eat feel as if they don't have control over how much they eat and have feelings of guilt or self-loathing after a binge eating episode. Katie Glass estimates that about 30 percent of adults with binge eating disorder also have a history of ADHD. The FDA has approved Vyvanse as a treatment option for both ADHD and binge eating. Vyvanse targets the brain's reward center by increasing the levels of dopamine and norepinephrine, the chemicals of the brain responsible for feelings of  pleasure. Mindful eating is the recommended nutritional approach to treating BED.   - Refill Lisdexamfetamine Dimesylate (VYVANSE) 40 MG CHEW; Chew 1 each by mouth daily.  Dispense: 30 tablet; Refill: 0  I have consulted the [place] Controlled Substances Registry for this patient, and feel the risk/benefit ratio today is favorable for proceeding with this prescription for a controlled substance. The patient understands monitoring parameters and red flags.   4. At risk for constipation Katie Glass was given approximately 8 minutes of counseling today regarding prevention of constipation. She was encouraged to increase water and fiber intake.   Counseling Getting to Good Bowel Health: Your goal is to have one soft bowel movement each day. Drink at least 8 glasses of water each day. Eat plenty of fiber (goal is over 25 grams each day). It is best to get most of your fiber from dietary sources which includes leafy green vegetables, fresh fruit, and whole grains. You may need to add fiber with the help of OTC fiber supplements. These include Metamucil, Citrucel, and Benefiber. If you are still having trouble, try adding Miralax or Magnesium Citrate. If all of these changes do not work, contact me.  5. Class 2 obesity due to excess calories without serious comorbidity with body mass index (BMI) of 37.0 to 37.9 in adult  Course: Katie Glass is currently in the action stage of change. As such, her goal is to continue with weight loss efforts.   Nutrition goals: She has agreed to the Category 4 Plan.  Exercise goals: For substantial health benefits, adults should do at least 150 minutes (2 hours and 30 minutes) a week of moderate-intensity, or 75 minutes (1 hour and 15 minutes) a week of vigorous-intensity aerobic physical activity, or an equivalent combination of moderate- and vigorous-intensity aerobic activity. Aerobic activity should be performed in episodes of at least 10 minutes, and preferably, it should be  spread throughout the week.  Behavioral modification strategies: increasing lean protein intake, decreasing simple carbohydrates, increasing vegetables and increasing water intake.  Katie Glass has agreed to follow-up with our clinic in 2-3 weeks. She was informed of the importance of frequent follow-up visits to maximize her success with intensive lifestyle modifications for her multiple health conditions.   Objective:   Blood pressure 138/76, pulse 74, temperature 98.5 F (36.9 C), temperature source Oral, height 5\' 6"  (1.676 m), weight 230 lb (104.3 kg), last menstrual period 01/31/2020, SpO2 98 %, unknown if currently breastfeeding. Body mass index is 37.12 kg/m.  General: Cooperative, alert, well developed, in no acute distress. HEENT: Conjunctivae and lids unremarkable. Cardiovascular: Regular rhythm.  Lungs: Normal work of breathing. Neurologic: No focal deficits.   Lab Results  Component Value Date   CREATININE 0.87 09/21/2019   BUN 13 09/21/2019   NA 142 09/21/2019   K 4.2 09/21/2019   CL 102 09/21/2019   CO2 22 09/21/2019   Lab Results  Component Value Date   ALT 9 09/21/2019   AST 14 09/21/2019   ALKPHOS 45 (L) 09/21/2019   BILITOT 0.6 09/21/2019   Lab Results  Component Value Date   HGBA1C 5.2 09/21/2019   HGBA1C 5.4 05/17/2019   Lab Results  Component Value Date   INSULIN 7.6 09/21/2019   INSULIN 13.2 05/17/2019   Lab Results  Component Value Date   TSH 1.350 05/17/2019   Lab Results  Component Value Date   CHOL 158 09/21/2019   HDL 58 09/21/2019   LDLCALC 85 09/21/2019   TRIG 77 09/21/2019   CHOLHDL 3.3 05/17/2019   Lab Results  Component Value Date   WBC 7.2 11/29/2019   HGB 13.9 11/29/2019   HCT 42.1 11/29/2019   MCV 93 11/29/2019   PLT 226 11/29/2019   Lab Results  Component Value Date   IRON 93 09/21/2019   TIBC 299 09/21/2019   FERRITIN 51 09/21/2019   Attestation Statements:   Reviewed by clinician on day of visit: allergies,  medications, problem list, medical history, surgical history, family history, social history, and previous encounter notes.  I, 09/23/2019, CMA, am acting as transcriptionist for Insurance claims handler, DO  I have reviewed the above documentation for accuracy and completeness, and I agree with the above. Helane Rima, DO

## 2020-02-22 ENCOUNTER — Other Ambulatory Visit: Payer: Self-pay

## 2020-02-22 ENCOUNTER — Ambulatory Visit (INDEPENDENT_AMBULATORY_CARE_PROVIDER_SITE_OTHER)
Admission: RE | Admit: 2020-02-22 | Discharge: 2020-02-22 | Disposition: A | Discharge status: Home / Self Care | Payer: BC Managed Care – PPO | Source: Ambulatory Visit | Attending: Family Medicine | Admitting: Family Medicine

## 2020-02-22 DIAGNOSIS — M84375A Stress fracture, left foot, initial encounter for fracture: Secondary | ICD-10-CM | POA: Diagnosis not present

## 2020-02-23 ENCOUNTER — Encounter: Payer: Self-pay | Admitting: Family Medicine

## 2020-02-24 DIAGNOSIS — M84375A Stress fracture, left foot, initial encounter for fracture: Secondary | ICD-10-CM | POA: Diagnosis not present

## 2020-02-27 ENCOUNTER — Encounter: Payer: Self-pay | Admitting: Family Medicine

## 2020-03-02 DIAGNOSIS — Z124 Encounter for screening for malignant neoplasm of cervix: Secondary | ICD-10-CM | POA: Diagnosis not present

## 2020-03-07 ENCOUNTER — Ambulatory Visit (INDEPENDENT_AMBULATORY_CARE_PROVIDER_SITE_OTHER): Payer: BC Managed Care – PPO

## 2020-03-07 ENCOUNTER — Ambulatory Visit (INDEPENDENT_AMBULATORY_CARE_PROVIDER_SITE_OTHER): Payer: BC Managed Care – PPO | Admitting: Family Medicine

## 2020-03-07 ENCOUNTER — Other Ambulatory Visit: Payer: Self-pay

## 2020-03-07 ENCOUNTER — Ambulatory Visit: Payer: Self-pay

## 2020-03-07 ENCOUNTER — Encounter: Payer: Self-pay | Admitting: Family Medicine

## 2020-03-07 VITALS — BP 140/80 | HR 80 | Ht 66.0 in | Wt 238.0 lb

## 2020-03-07 DIAGNOSIS — M84375G Stress fracture, left foot, subsequent encounter for fracture with delayed healing: Secondary | ICD-10-CM | POA: Diagnosis not present

## 2020-03-07 DIAGNOSIS — M79672 Pain in left foot: Secondary | ICD-10-CM | POA: Diagnosis not present

## 2020-03-07 DIAGNOSIS — M79671 Pain in right foot: Secondary | ICD-10-CM

## 2020-03-07 DIAGNOSIS — M25571 Pain in right ankle and joints of right foot: Secondary | ICD-10-CM | POA: Diagnosis not present

## 2020-03-07 DIAGNOSIS — S93622A Sprain of tarsometatarsal ligament of left foot, initial encounter: Secondary | ICD-10-CM | POA: Diagnosis not present

## 2020-03-07 DIAGNOSIS — M25572 Pain in left ankle and joints of left foot: Secondary | ICD-10-CM | POA: Diagnosis not present

## 2020-03-07 IMAGING — DX DG ANKLE COMPLETE 3+V*R*
3 series · 3 of 3 positions shown · non-contrast
Comparison: None.

CLINICAL DATA: Chronic right ankle pain.

EXAM:
RIGHT ANKLE - COMPLETE 3+ VIEW

[ankle ap]
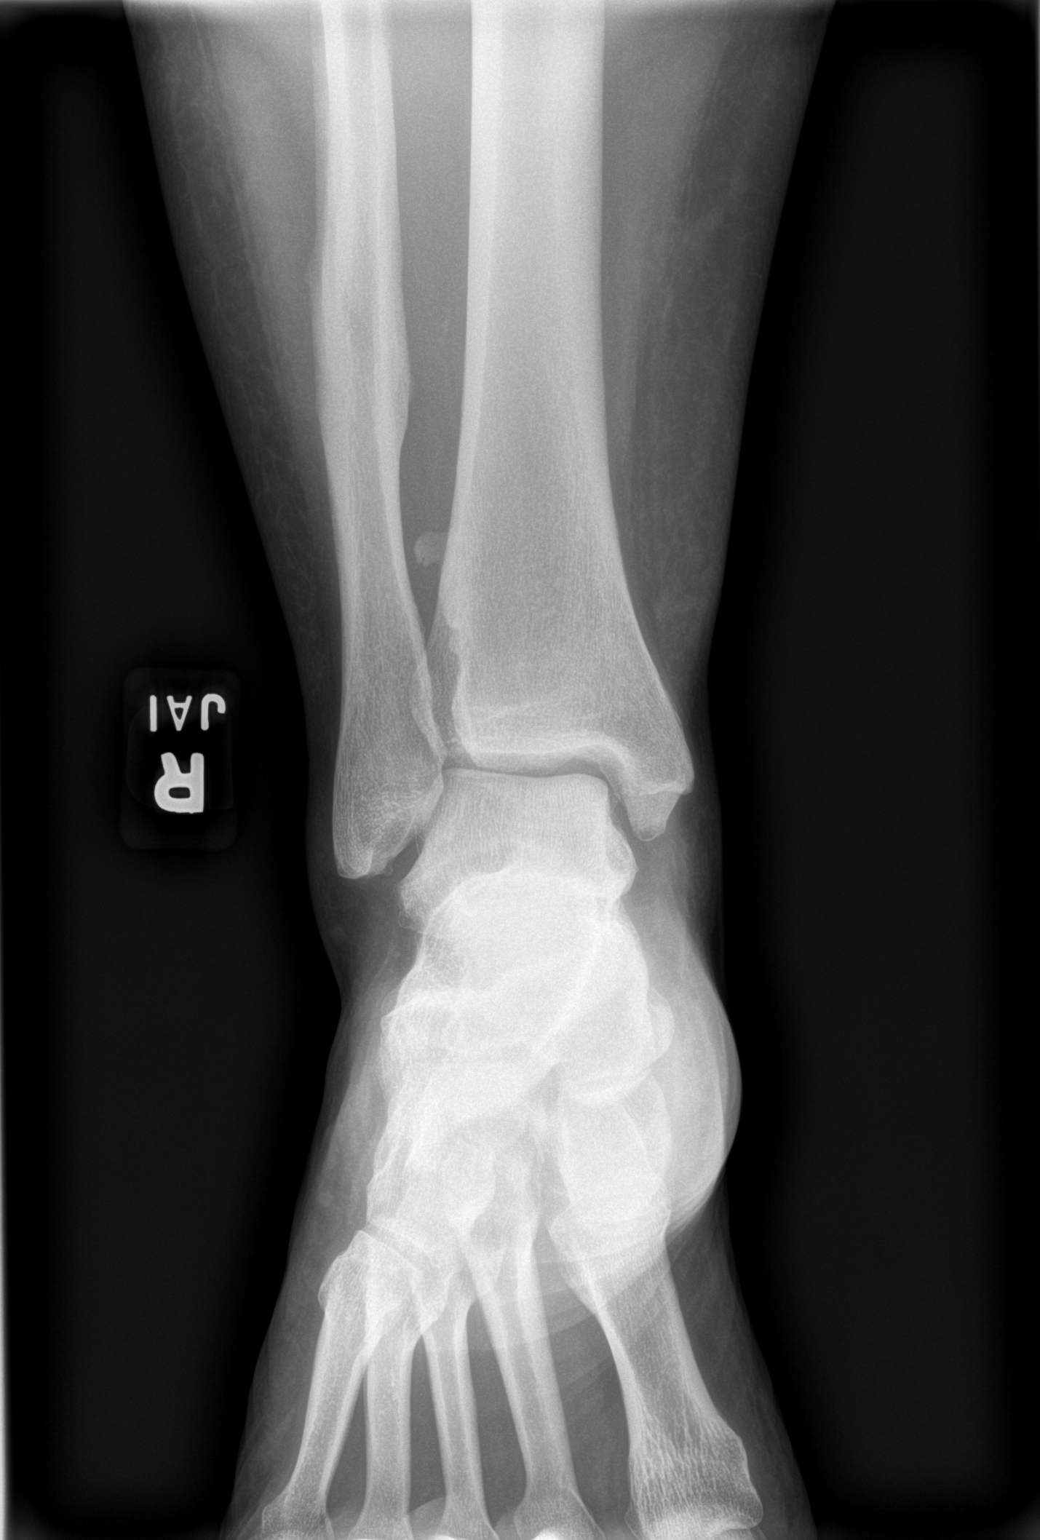

[ankle obl]
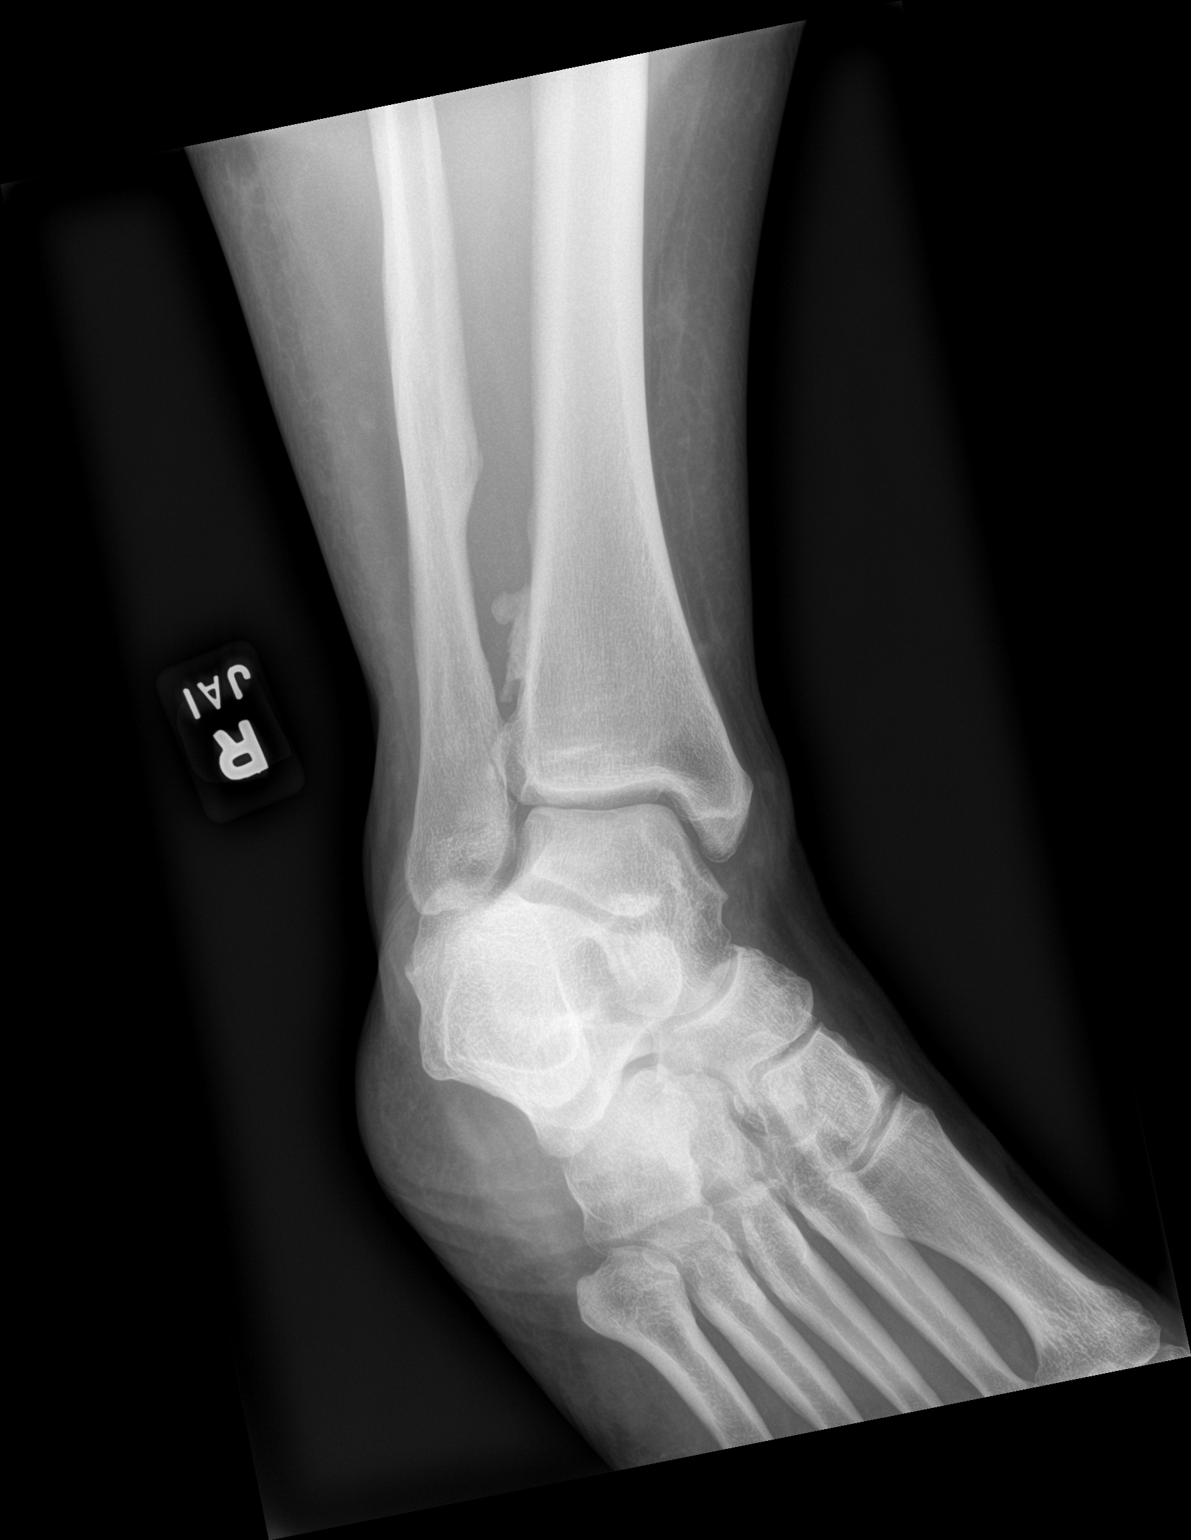

[ankle lat]
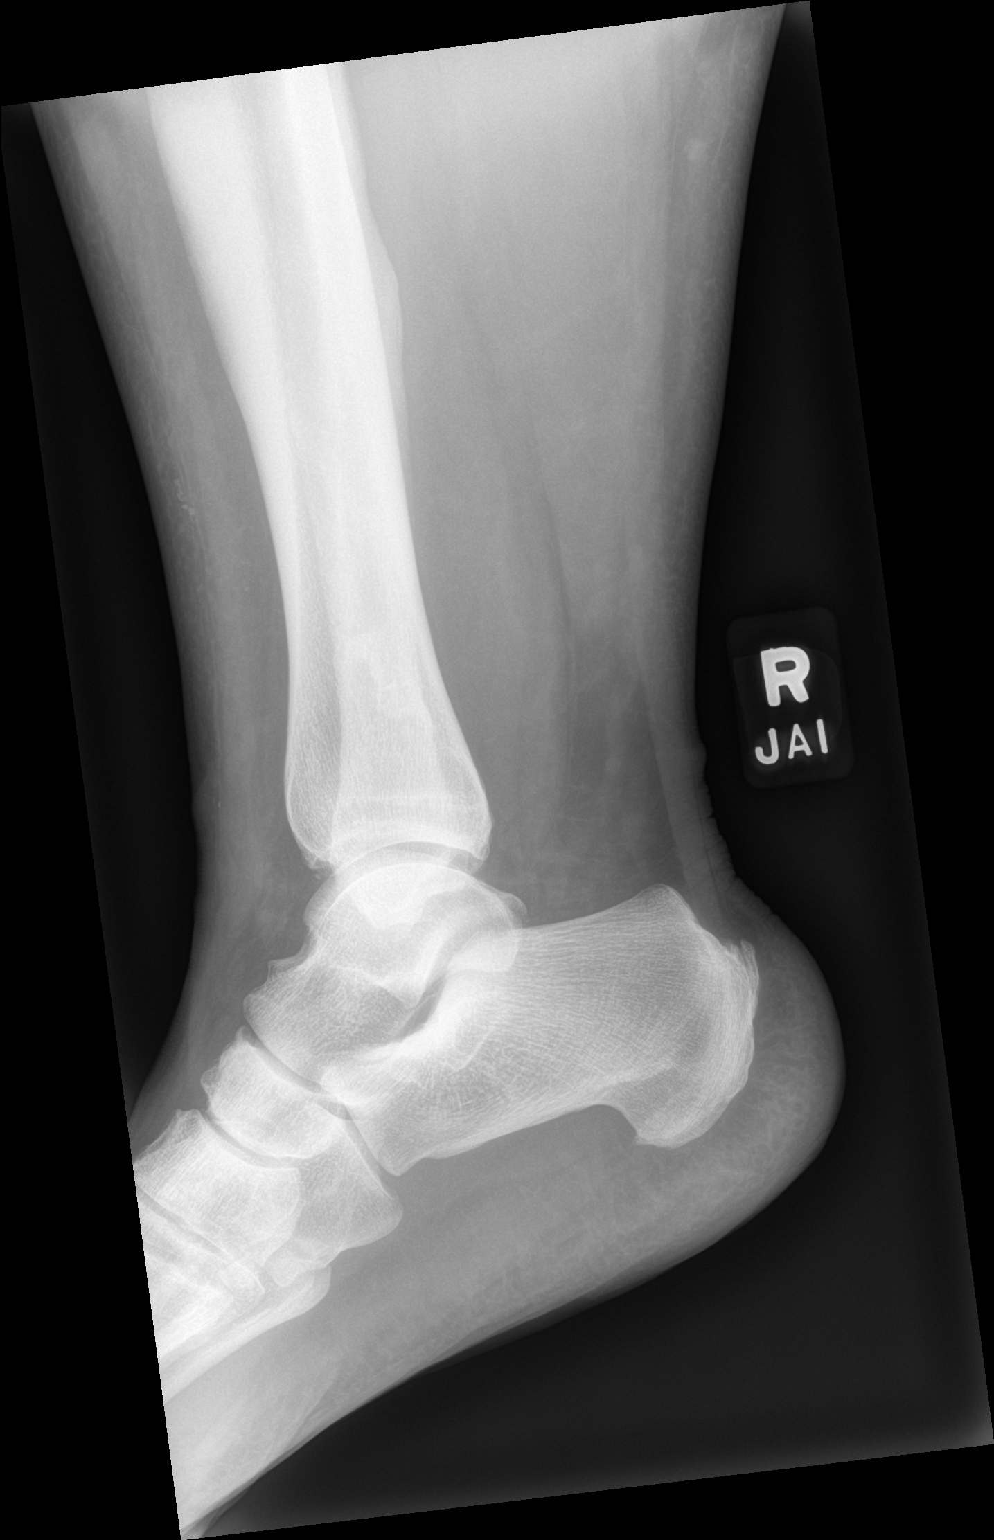

[3 of 3 positions shown; findings below may reference images not displayed]

FINDINGS: There is no evidence of acute fracture, dislocation, or joint
effusion. There is no evidence of arthropathy. Probable old distal
right fibular shaft fracture is noted. Soft tissues are
unremarkable.
IMPRESSION: No acute abnormality seen in the right ankle.

## 2020-03-07 IMAGING — DX DG FOOT COMPLETE 3+V*R*
3 series · 3 of 3 positions shown · non-contrast
Comparison: None.

CLINICAL DATA: Chronic right foot pain.

EXAM:
RIGHT FOOT COMPLETE - 3+ VIEW

[foot ap]
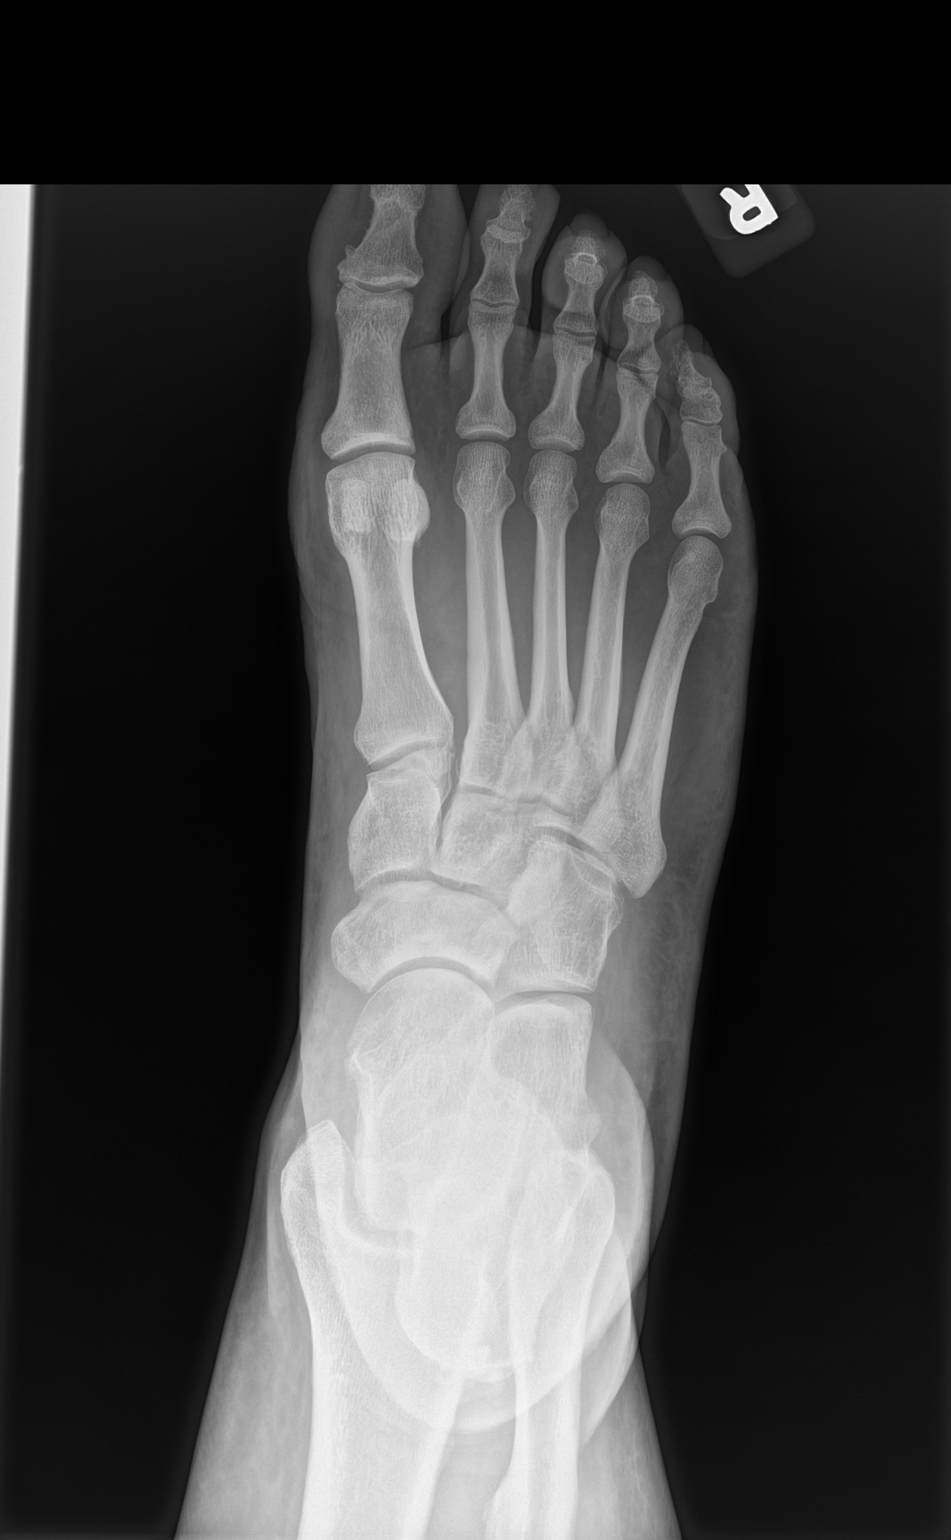

[foot obl]
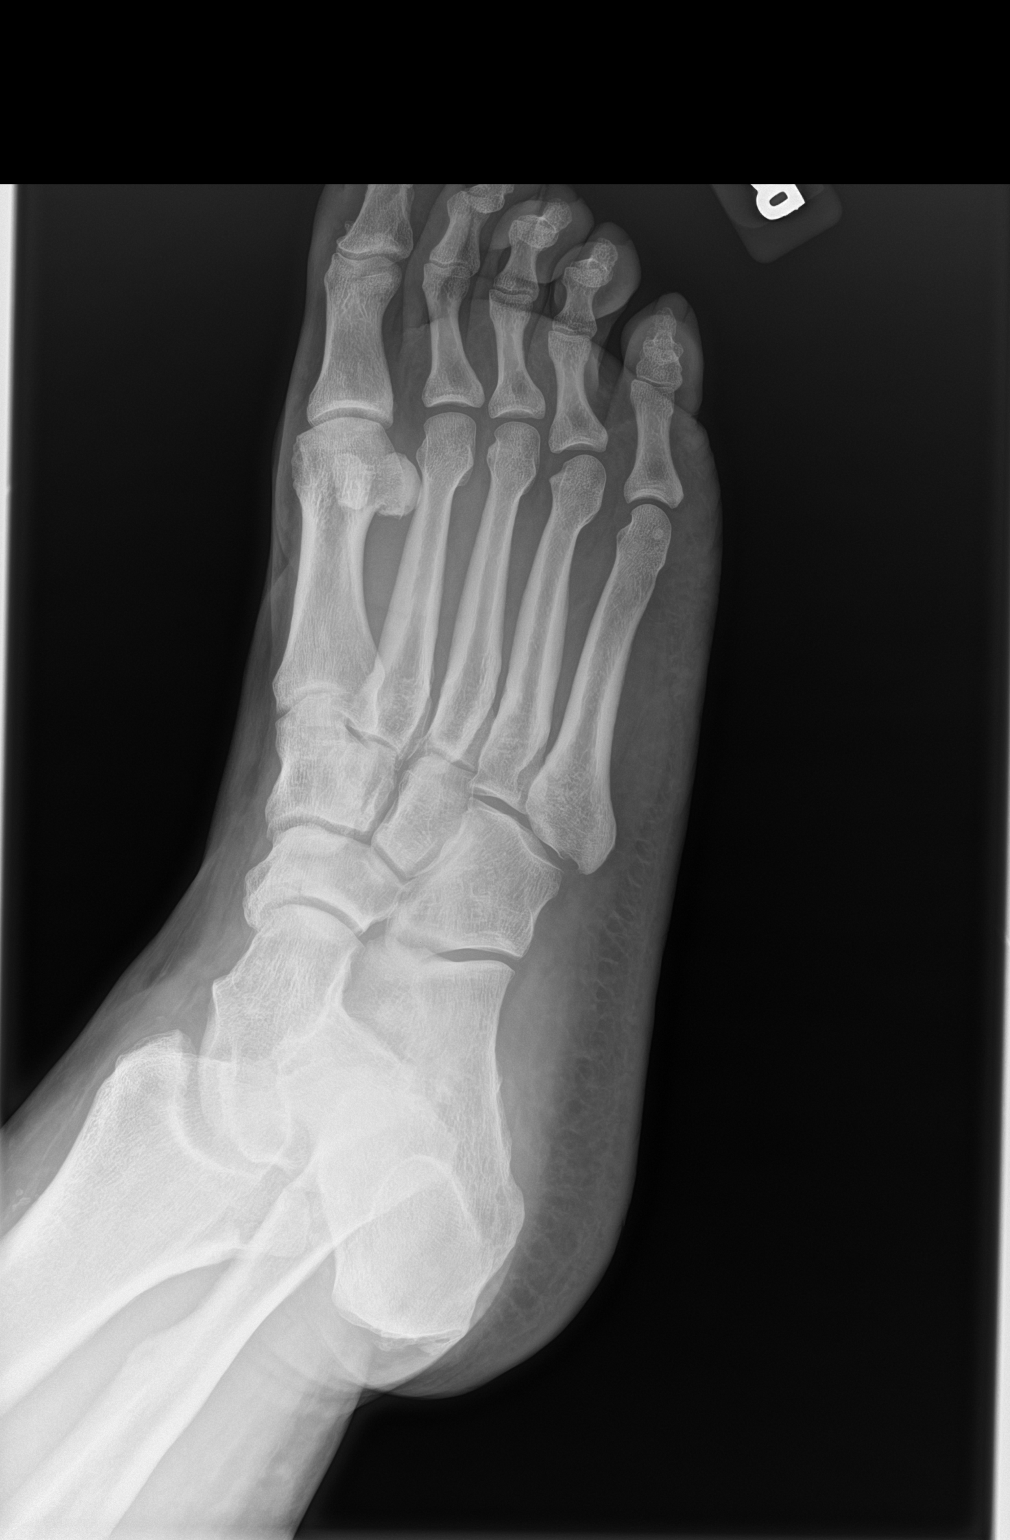

[foot lat]
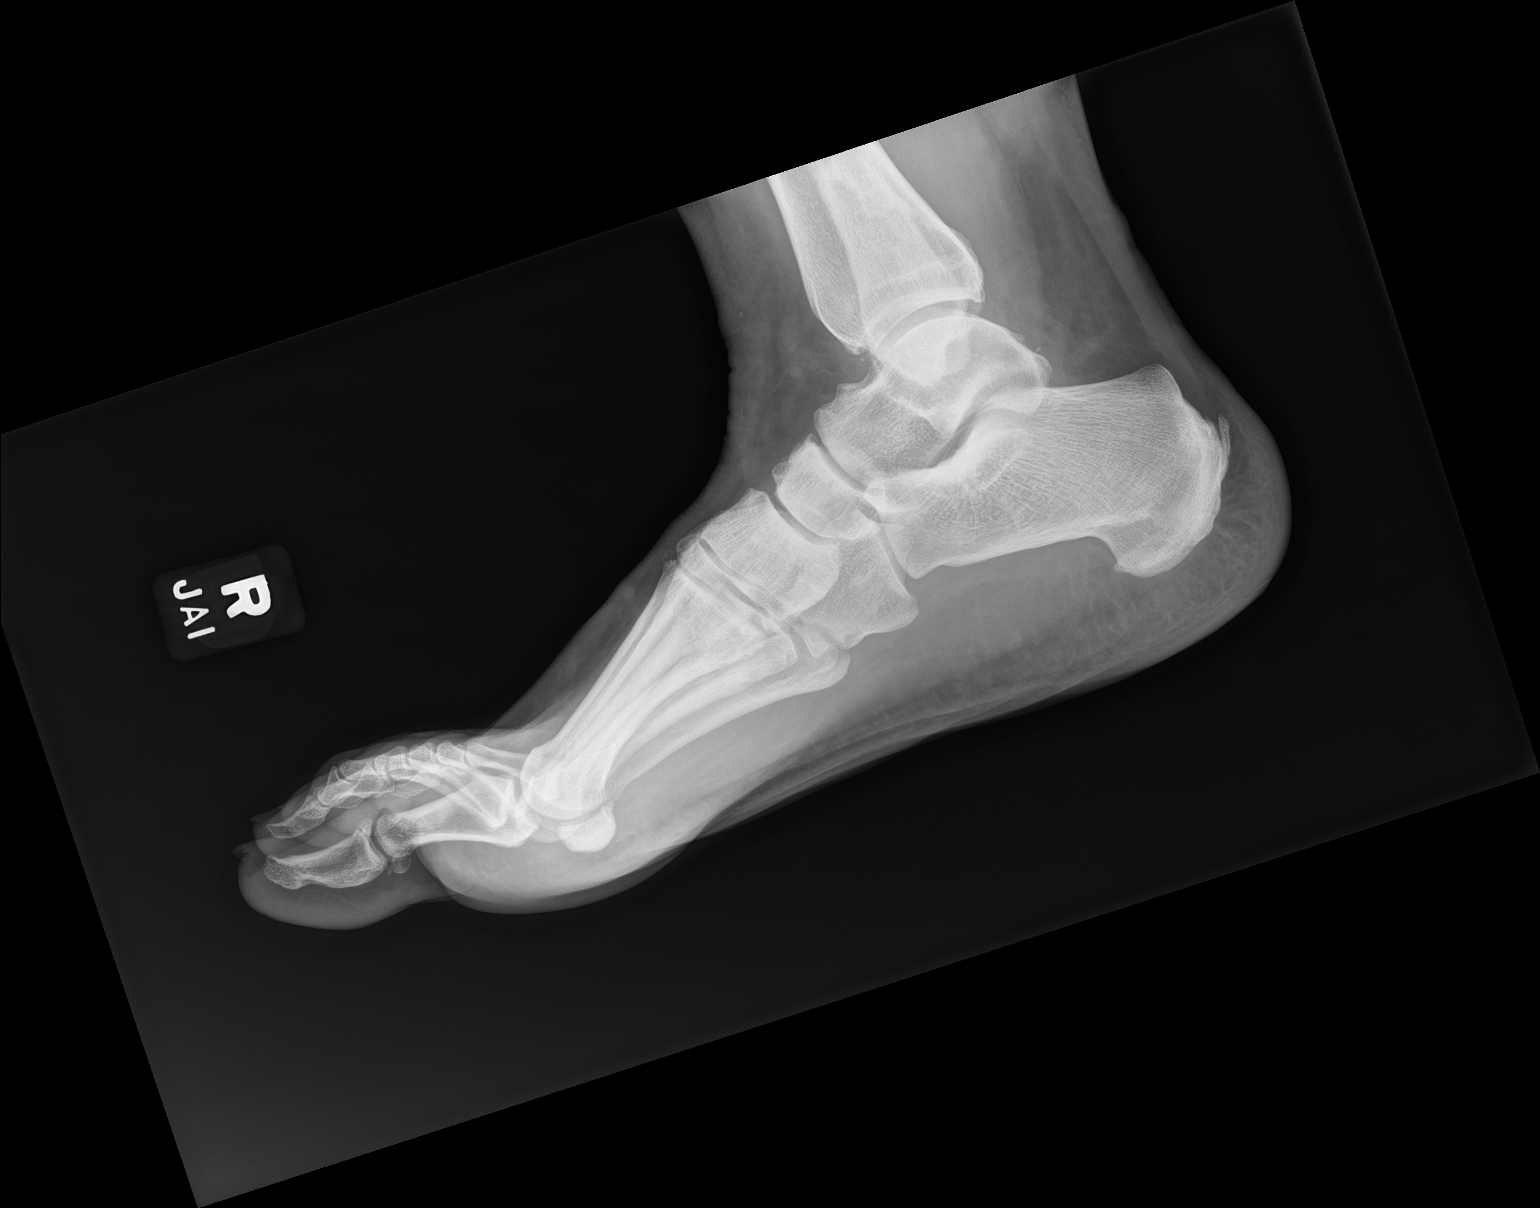

[3 of 3 positions shown; findings below may reference images not displayed]

FINDINGS: There is no evidence of fracture or dislocation. There is no
evidence of arthropathy or other focal bone abnormality. Soft
tissues are unremarkable.
IMPRESSION: Negative.

## 2020-03-07 IMAGING — DX DG ANKLE COMPLETE 3+V*L*
3 series · 3 of 3 positions shown · non-contrast
Comparison: None.

CLINICAL DATA: Left foot pain x9 months

EXAM:
LEFT ANKLE COMPLETE - 3+ VIEW

[ankle ap]
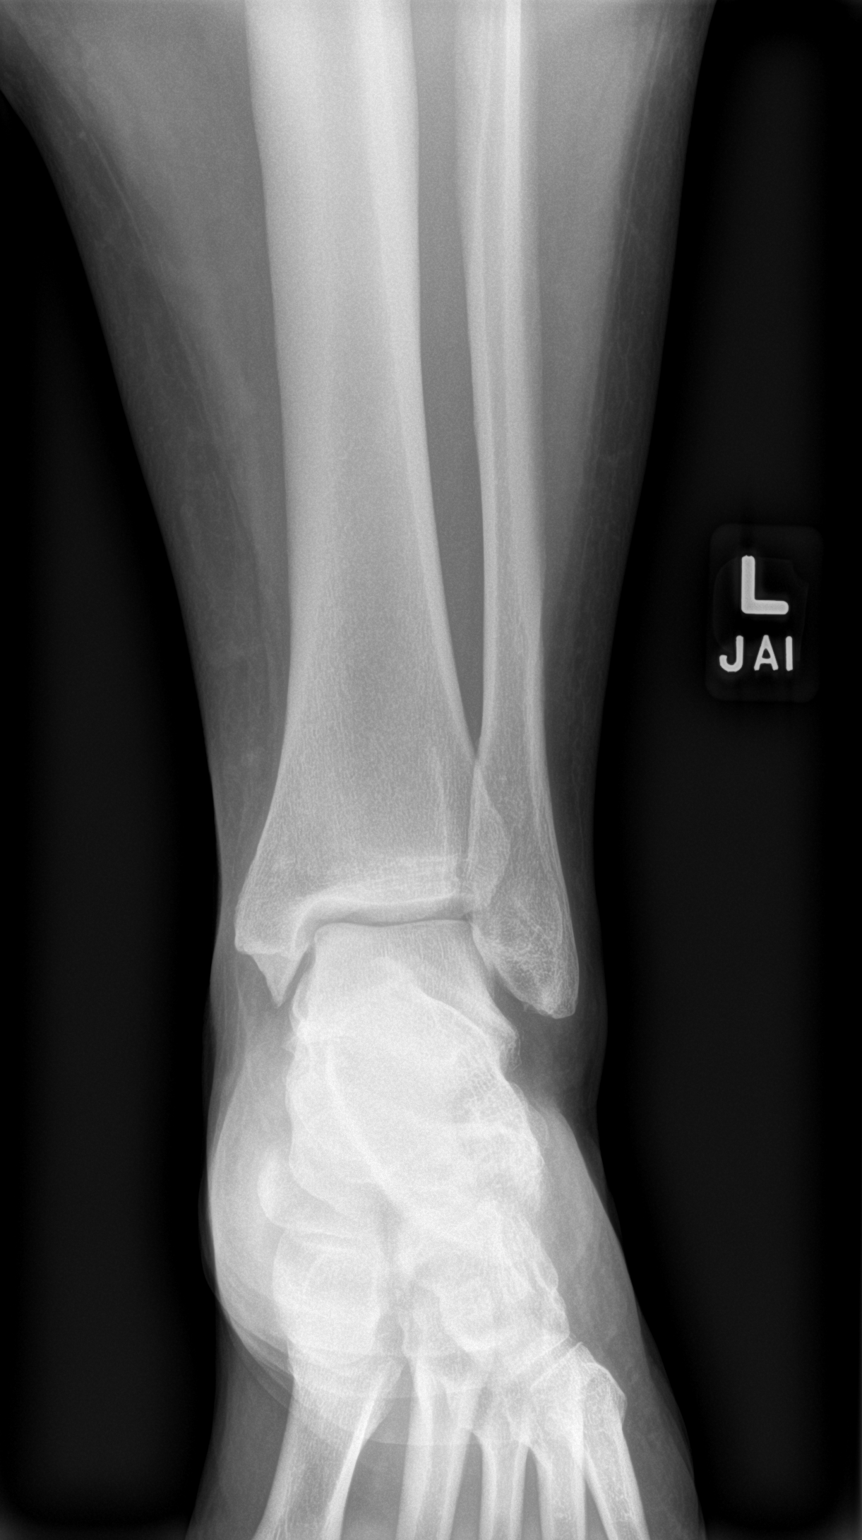

[ankle obl]
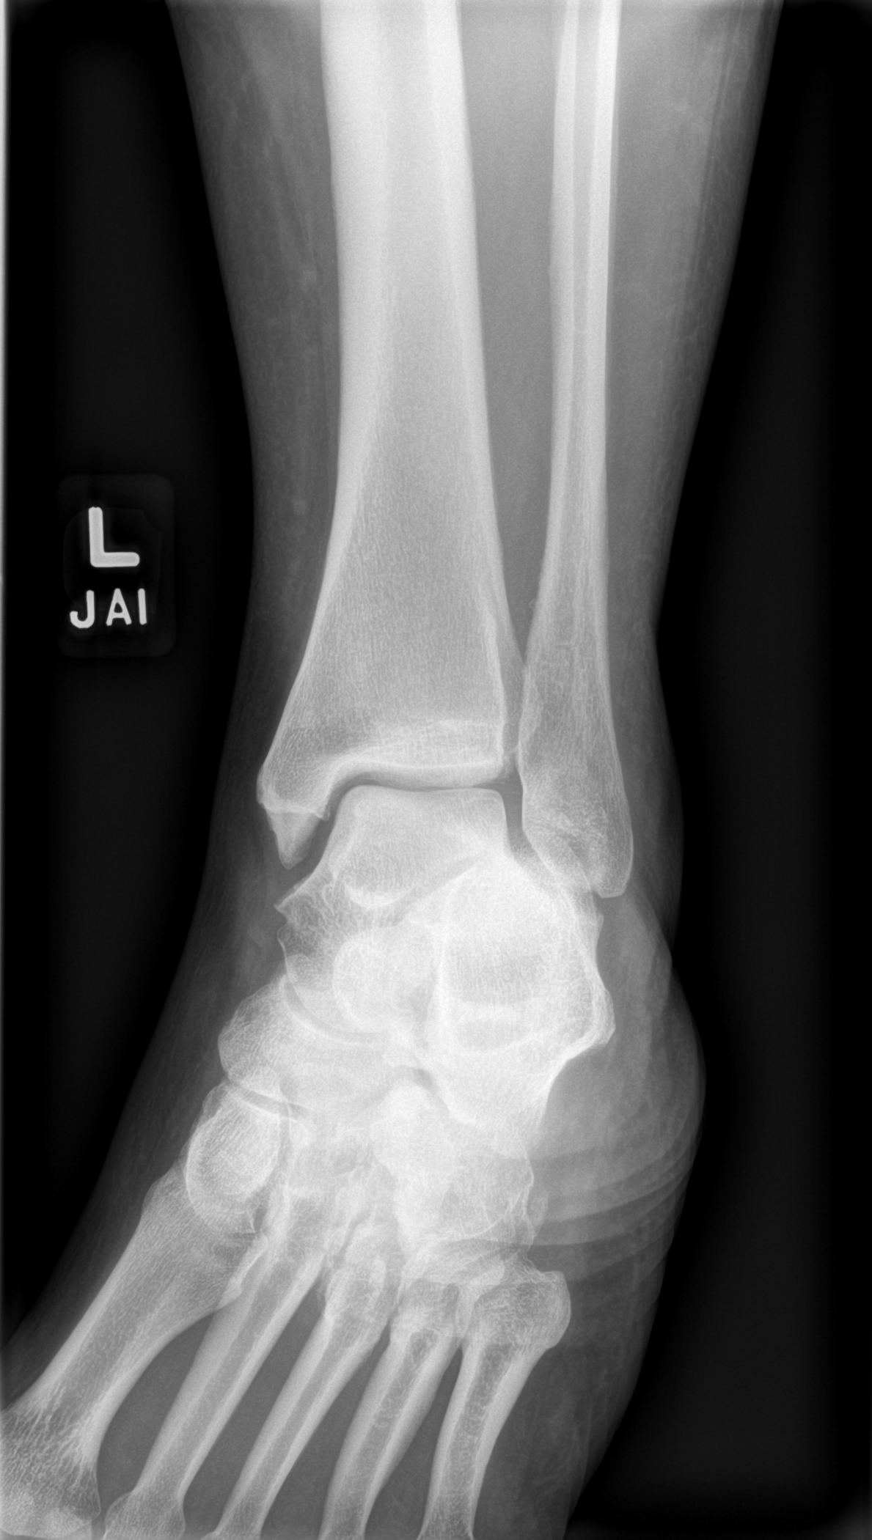

[ankle lat]
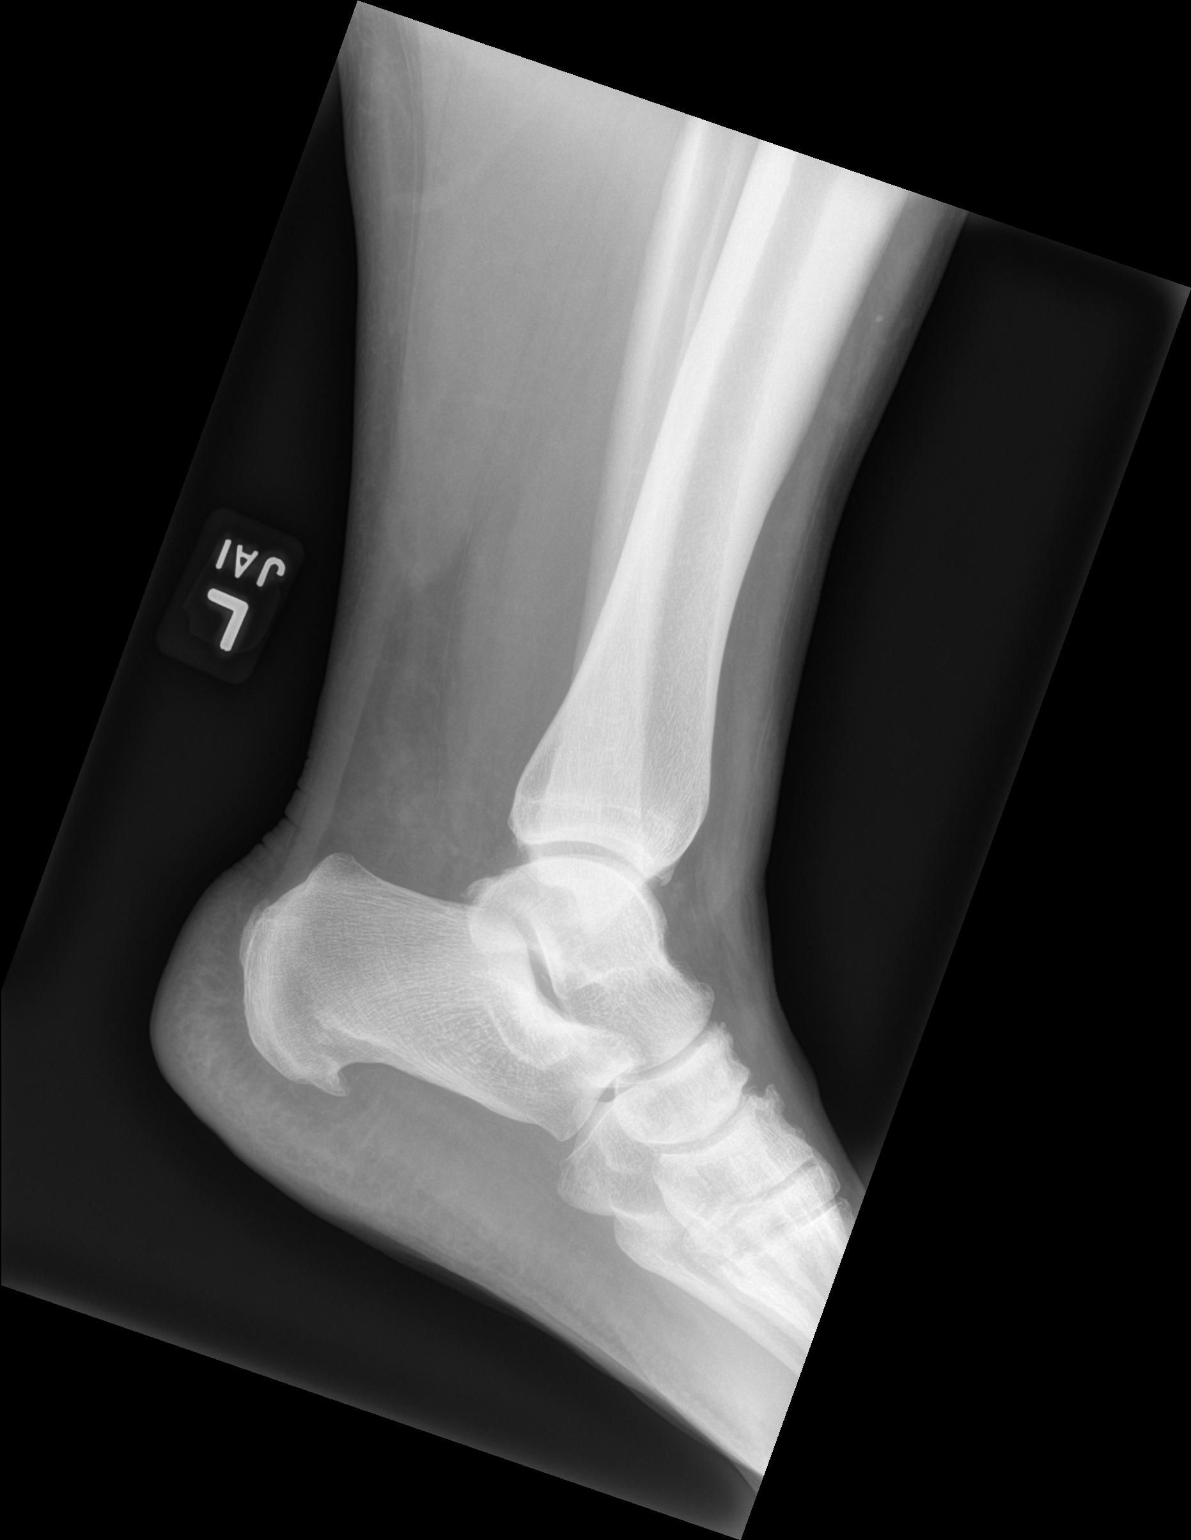

[3 of 3 positions shown; findings below may reference images not displayed]

FINDINGS: No fracture or dislocation is seen.

The ankle mortise is intact.

The base of the fifth metatarsal is unremarkable.

Small plantar calcaneal enthesophyte.

The visualized soft tissues are unremarkable.
IMPRESSION: Negative.

## 2020-03-07 NOTE — Assessment & Plan Note (Signed)
Patient has had a sprain previously but I am concerned for more than arthritic changes at this time.  Patient is x-rays were fairly unremarkable and I think further evaluation is necessary.  Laboratory work-up has showed only the low vitamin D.  At this time with the chronic pain and going on 7 months of treatment including cam walkers, custom orthotics, home exercises and physical therapy with intermittent improvement but then worsening I do feel that further evaluation with MRI is necessary.

## 2020-03-07 NOTE — Assessment & Plan Note (Signed)
Patient has not made a significant change at this time.  It does appear that patient does have some callus formation over the top but I do think that there is still a Lisfranc sprain or injury that is contributing.  We discussed with patient for the last well about potentially slowing down.  We discussed even being nonweightbearing with patient is unable to do.  Patient continues to be very active and has been wearing the cam walker diligently.  Patient will continue with the vitamin D supplementation.  We will at this point get an MRI of both feet and ankle to further evaluate and secondary to the amount of discomfort and continued pain.  Patient will follow up with me again after imaging and will discuss further treatment options

## 2020-03-07 NOTE — Patient Instructions (Addendum)
Good to see you Bilateral foot and ankle MRI at [facility] Put the heel lift in the boot Lets see what MRI shows I will be in touch

## 2020-03-07 NOTE — Progress Notes (Signed)
[name] [address] [place] [address] Phone: [phone] Subjective:   I Katie Glass am serving as a Neurosurgeon for Dr. Antoine Primas.  This visit occurred during the SARS-CoV-2 public health emergency.  Safety protocols were in place, including screening questions prior to the visit, additional usage of staff PPE, and extensive cleaning of exam room while observing appropriate contact time as indicated for disinfecting solutions.   I'm seeing this patient by the request  of:  [name], MD  CC: foot pain follow up   CWC:[id number]   02/01/2020 Patient is increasing her activity again at this time.  I do believe the patient has a stress fracture with patient having a rigid midfoot.  We will have patient be put in with custom orthotics, continue vitamin D, icing regimen, and walker with significant amount of activity.  Discussed topical anti-inflammatories.  Follow-up again 4 to 6 weeks   Update 03/07/2020 Katie Glass is a 44 y.o. female coming in with complaint of left foot pain. Patient states she is not doing well today. Foot is painful and she is concerned about the right foot as well.  Patient is having more pain on the left than the right.  Patient has been wearing the cam walker fairly regularly but is walking as well as being very active still.  Patient has been somewhat noncompliant.  Patient had been wearing the custom orthotics previously still doing the vitamin D.  Patient feels like the pain is just not improving overall and would like more answers to why she would need to continue to have this problem.  Patient did have a bone density which was normal.    Past Medical History:  Diagnosis Date  . Arthritis    knees  . Asthma    as a child  . Back pain   . Food allergy    Tannin/sulfate in [place], rash on face and hands  . Joint pain   . Lower extremity edema   . MMT (medial meniscus tear)    right  . Sleep  apnea    CPAP nightly  . Umbilical hernia    Past Surgical History:  Procedure Laterality Date  . BILATERAL SALPINGECTOMY Bilateral 04/15/2018   Procedure: BILATERAL SALPINGECTOMY;  Surgeon: Carrington Clamp, MD;  [facility];  Service: Obstetrics;  Laterality: Bilateral;  . CESAREAN SECTION  11/12/2010   Procedure: CESAREAN SECTION;  Surgeon: Almon Hercules;  Location: [facility];  Service: Gynecology;  Laterality: N/A;  . CESAREAN SECTION N/A 04/15/2018   Procedure: CESAREAN SECTION;  Surgeon: Carrington Clamp, MD;  Location: [facility];  Service: Obstetrics;  Laterality: N/A;  . KNEE ARTHROSCOPY Right 02/11/2017   Procedure: RIGHT KNEE ARTHROSCOPY WITH DEBRIDEMENT CHONDROMALACIA;  Surgeon: Gean Birchwood, MD;  Location: [facility];  Service: Orthopedics;  Laterality: Right;   Social History   Socioeconomic History  . Marital status: Married    Spouse name: Luvada Salamone  . Number of children: Not on file  . Years of education: Not on file  . Highest education level: Not on file  Occupational History  . Occupation: Pastor  Tobacco Use  . Smoking status: Never Smoker  . Smokeless tobacco: Never Used  Vaping Use  . Vaping Use: Never used  Substance and Sexual Activity  . Alcohol use: Yes    Comment: social  . Drug use: No  . Sexual activity: Yes    Birth control/protection: None  Other Topics Concern  . Not on file  Social History Narrative  . Not on file   Social Determinants of Health   Financial Resource Strain: Not on file  Food Insecurity: Not on file  Transportation Needs: Not on file  Physical Activity: Not on file  Stress: Not on file  Social Connections: Not on file   No Known Allergies Family History  Problem Relation Age of Onset  . Hypertension Father   . Hyperlipidemia Father   . Heart disease Father   . Cancer Maternal Grandfather   . Heart disease Paternal Grandfather   . Pulmonary embolism Mother   . Alcohol  abuse Mother     Current Outpatient Medications (Endocrine & Metabolic):  .  levonorgestrel (MIRENA) 20 MCG/24HR IUD, 1 each by Intrauterine route once.   Current Outpatient Medications (Respiratory):  .  cetirizine (ZYRTEC) 10 MG tablet, Take 10 mg by mouth daily. .  fluticasone (FLONASE) 50 MCG/ACT nasal spray, Place 1 spray into both nostrils daily.  Current Outpatient Medications (Analgesics):  .  acetaminophen (TYLENOL) 500 MG tablet, Take 500 mg by mouth every 6 (six) hours as needed. Marland Kitchen  ibuprofen (ADVIL) 200 MG tablet, Take 200 mg by mouth every 6 (six) hours as needed.  Current Outpatient Medications (Hematological):  .  tranexamic acid (LYSTEDA) 650 MG TABS tablet, Take 1,300 mg by mouth. Take one tablet by mouth three times a day during menses  Current Outpatient Medications (Other):  Marland Kitchen  Lisdexamfetamine Dimesylate (VYVANSE) 40 MG CHEW, Chew 1 each by mouth daily. .  Multiple Vitamins-Minerals (WOMENS MULTI VITAMIN & MINERAL PO), Take 1 tablet by mouth daily. Marland Kitchen  triamcinolone cream (KENALOG) 0.1 %, Apply 1 application topically 2 (two) times daily as needed. .  Vitamin D, Ergocalciferol, (DRISDOL) 1.25 MG (50000 UNIT) CAPS capsule, Take 1 capsule (50,000 Units total) by mouth every 7 (seven) days. .  vitamin k 100 MCG tablet, Take 200 mcg by mouth daily.   Reviewed prior external information including notes and imaging from  primary care provider As well as notes that were available from care everywhere and other healthcare systems.  Past medical history, social, surgical and family history all reviewed in electronic medical record.  No pertanent information unless stated regarding to the chief complaint.   Review of Systems:  No headache, visual changes, nausea, vomiting, diarrhea, constipation, dizziness, abdominal pain, skin rash, fevers, chills, night sweats, weight loss, swollen lymph nodes, body aches, joint swelling, chest pain, shortness of breath, mood changes.  POSITIVE muscle aches  Objective  Blood pressure 140/80, pulse 80, height 5\' 6"  (1.676 m), weight 238 lb (108 kg), SpO2 98 %, unknown if currently breastfeeding.   General: No apparent distress alert and oriented x3 mood and affect normal, dressed appropriately.  HEENT: Pupils equal, extraocular movements intact  Respiratory: Patient's speak in full sentences and does not appear short of breath  Cardiovascular: No lower extremity edema, non tender, no erythema  Mild antalgic gait Foot exam still shows the patient does have the breakdown of the transverse arch bilaterally.  Mild high midfoot is rigid at this time.  The patient is still tender on the left side proximal Lisfranc laterally.  The patient also has some mild pain over the fourth metatarsal still. Patient has mild pain over the first and second metatarsals  Limited musculoskeletal ultrasound was performed and interpreted by  Limited ultrasound now of the first toe and the metatarsal does not show any significant cortical irregularity  as we saw previously.  Patient does have some mild stress reaction with some callus formation that appears more over the fourth metatarsal today which is different. Right foot also looked at and patient does have an effusion of the first MTP.  Patient does have arthritic changes of the midfoot joint bilaterally. Impression: Foot arthritis with some healing with potential stress reaction   Impression and Recommendations:     The above documentation has been reviewed and is accurate and complete Judi Saa, DO

## 2020-03-07 NOTE — Assessment & Plan Note (Addendum)
Patient on ultrasound today does have some inflammation noted of the first MTP.  Seems to be more turf toe and likely some compensation.  Patient has pain that seems to continue to give her more difficulty.  Patient was to continue to be active and is wanting to lose weight.  Get MRIs to further evaluate patient has been somewhat noncompliant and very active still significant improvement would be noted at this time usually we are not.  Patient did have a bone density that was unremarkable

## 2020-03-08 ENCOUNTER — Encounter: Payer: Self-pay | Admitting: Family Medicine

## 2020-03-12 ENCOUNTER — Other Ambulatory Visit: Payer: Self-pay

## 2020-03-12 ENCOUNTER — Encounter (INDEPENDENT_AMBULATORY_CARE_PROVIDER_SITE_OTHER): Payer: Self-pay | Admitting: Family Medicine

## 2020-03-12 ENCOUNTER — Ambulatory Visit (INDEPENDENT_AMBULATORY_CARE_PROVIDER_SITE_OTHER): Payer: BC Managed Care – PPO | Admitting: Family Medicine

## 2020-03-12 VITALS — BP 149/61 | HR 88 | Temp 98.2°F | Ht 66.0 in | Wt 229.0 lb

## 2020-03-12 DIAGNOSIS — E559 Vitamin D deficiency, unspecified: Secondary | ICD-10-CM

## 2020-03-12 DIAGNOSIS — Z9189 Other specified personal risk factors, not elsewhere classified: Secondary | ICD-10-CM

## 2020-03-12 DIAGNOSIS — M79672 Pain in left foot: Secondary | ICD-10-CM | POA: Diagnosis not present

## 2020-03-12 DIAGNOSIS — G8929 Other chronic pain: Secondary | ICD-10-CM

## 2020-03-12 DIAGNOSIS — F5081 Binge eating disorder: Secondary | ICD-10-CM

## 2020-03-12 DIAGNOSIS — Z6836 Body mass index (BMI) 36.0-36.9, adult: Secondary | ICD-10-CM

## 2020-03-12 MED ORDER — VITAMIN D (ERGOCALCIFEROL) 1.25 MG (50000 UNIT) PO CAPS: 50000.0000 [IU] | ORAL_CAPSULE | ORAL | 0 refills | Status: DC

## 2020-03-12 NOTE — Progress Notes (Signed)
Chief Complaint:   OBESITY Katie Glass is here to discuss her progress with her obesity treatment plan along with follow-up of her obesity related diagnoses.   Today's visit was #: 14 Starting weight: 268 lbs Starting date: 09/21/2019 Today's weight: 229 lbs Today's date: 03/12/2020 Total lbs lost to date: 39 lbs Body mass index is 36.96 kg/m.  Total weight loss percentage to date: -14.55%  Interim History: Katie Glass says she is meeting or is just under her calorie/protein goals.  Hunger levels are low with Vyvanse.  She says she has been drinking too many diet sodas.  Her clothing size has gone from 2XL to XL. Nutrition Plan: the Category 4 Plan for 60% of the time.  Hunger is well controlled. Cravings are well controlled.  Activity: Walking/boot camp for 60 minutes for 5-7 times per week.  Assessment/Plan:   1. Vitamin D deficiency Not at goal. Current vitamin D is 25.0, tested on 09/21/2019. Optimal goal > 50 ng/dL.   Plan:  [x]   Continue Vitamin D @50 ,000 IU every week. []   Continue home supplement daily. [x]   Follow-up for routine testing of Vitamin D at least 2-3 times per year to avoid over-replacement.  2. Chronic foot pain, left Followed by Sports Medicine.  MRI scheduled for 12/27. We will continue to monitor symptoms as they relate to her weight loss journey.  3. Binge eating disorder Improving. The current medical regimen is effective;  continue present plan and medications. We will continue to monitor symptoms as they relate to her weight loss journey.  Plan:  Continue Vyvanse.  4. At risk for activity intolerance Gudrun was given approximately 9 minutes of exercise intolerance counseling today. She is 44 y.o. female and has risk factors exercise intolerance including obesity.  We reviewed precautions while needing boot, MRI, pain. We discussed intensive lifestyle modifications today with an emphasis on specific weight loss instructions and strategies.   5.  Class 2 severe obesity with serious comorbidity and body mass index (BMI) of 36.0 to 36.9 in adult, unspecified obesity type (HCC)  Course: Katie Glass is currently in the action stage of change. As such, her goal is to continue with weight loss efforts.   Nutrition goals: She has agreed to the Category 4 Plan.   Exercise goals: For substantial health benefits, adults should do at least 150 minutes (2 hours and 30 minutes) a week of moderate-intensity, or 75 minutes (1 hour and 15 minutes) a week of vigorous-intensity aerobic physical activity, or an equivalent combination of moderate- and vigorous-intensity aerobic activity. Aerobic activity should be performed in episodes of at least 10 minutes, and preferably, it should be spread throughout the week.  Behavioral modification strategies: increasing vegetables and emotional eating strategies.  [name] has agreed to follow-up with our clinic in 2-3 weeks. She was informed of the importance of frequent follow-up visits to maximize her success with intensive lifestyle modifications for her multiple health conditions.   Objective:   Blood pressure (!) 149/61, pulse 88, temperature 98.2 F (36.8 C), temperature source Oral, height 5\' 6"  (1.676 m), weight 229 lb (103.9 kg), SpO2 99 %, unknown if currently breastfeeding. Body mass index is 36.96 kg/m.  General: Cooperative, alert, well developed, in no acute distress. HEENT: Conjunctivae and lids unremarkable. Cardiovascular: Regular rhythm.  Lungs: Normal work of breathing. Neurologic: No focal deficits.   Lab Results  Component Value Date   CREATININE 0.87 09/21/2019   BUN 13 09/21/2019   NA 142 09/21/2019   K 4.2  09/21/2019   CL 102 09/21/2019   CO2 22 09/21/2019   Lab Results  Component Value Date   ALT 9 09/21/2019   AST 14 09/21/2019   ALKPHOS 45 (L) 09/21/2019   BILITOT 0.6 09/21/2019   Lab Results  Component Value Date   HGBA1C 5.2 09/21/2019   HGBA1C 5.4 05/17/2019   Lab  Results  Component Value Date   INSULIN 7.6 09/21/2019   INSULIN 13.2 05/17/2019   Lab Results  Component Value Date   TSH 1.350 05/17/2019   Lab Results  Component Value Date   CHOL 158 09/21/2019   HDL 58 09/21/2019   LDLCALC 85 09/21/2019   TRIG 77 09/21/2019   CHOLHDL 3.3 05/17/2019   Lab Results  Component Value Date   WBC 7.2 11/29/2019   HGB 13.9 11/29/2019   HCT 42.1 11/29/2019   MCV 93 11/29/2019   PLT 226 11/29/2019   Lab Results  Component Value Date   IRON 93 09/21/2019   TIBC 299 09/21/2019   FERRITIN 51 09/21/2019   Attestation Statements:   Reviewed by clinician on day of visit: allergies, medications, problem list, medical history, surgical history, family history, social history, and previous encounter notes.  I, Insurance claims handler, CMA, am acting as transcriptionist for Helane Rima, DO  I have reviewed the above documentation for accuracy and completeness, and I agree with the above. Helane Rima, DO

## 2020-03-19 ENCOUNTER — Ambulatory Visit (INDEPENDENT_AMBULATORY_CARE_PROVIDER_SITE_OTHER): Payer: BC Managed Care – PPO

## 2020-03-19 ENCOUNTER — Other Ambulatory Visit: Payer: Self-pay

## 2020-03-19 DIAGNOSIS — G8929 Other chronic pain: Secondary | ICD-10-CM | POA: Diagnosis not present

## 2020-03-19 DIAGNOSIS — M79672 Pain in left foot: Secondary | ICD-10-CM

## 2020-03-19 DIAGNOSIS — M76811 Anterior tibial syndrome, right leg: Secondary | ICD-10-CM | POA: Diagnosis not present

## 2020-03-19 DIAGNOSIS — M19079 Primary osteoarthritis, unspecified ankle and foot: Secondary | ICD-10-CM | POA: Diagnosis not present

## 2020-03-19 DIAGNOSIS — M79671 Pain in right foot: Secondary | ICD-10-CM

## 2020-03-19 DIAGNOSIS — M76822 Posterior tibial tendinitis, left leg: Secondary | ICD-10-CM | POA: Diagnosis not present

## 2020-03-19 DIAGNOSIS — R600 Localized edema: Secondary | ICD-10-CM

## 2020-03-19 DIAGNOSIS — M722 Plantar fascial fibromatosis: Secondary | ICD-10-CM | POA: Diagnosis not present

## 2020-03-19 DIAGNOSIS — S86391A Other injury of muscle(s) and tendon(s) of peroneal muscle group at lower leg level, right leg, initial encounter: Secondary | ICD-10-CM | POA: Diagnosis not present

## 2020-03-19 DIAGNOSIS — M19071 Primary osteoarthritis, right ankle and foot: Secondary | ICD-10-CM | POA: Diagnosis not present

## 2020-03-19 DIAGNOSIS — M25474 Effusion, right foot: Secondary | ICD-10-CM | POA: Diagnosis not present

## 2020-03-19 DIAGNOSIS — M76812 Anterior tibial syndrome, left leg: Secondary | ICD-10-CM | POA: Diagnosis not present

## 2020-03-19 DIAGNOSIS — M19072 Primary osteoarthritis, left ankle and foot: Secondary | ICD-10-CM | POA: Diagnosis not present

## 2020-03-19 DIAGNOSIS — M76821 Posterior tibial tendinitis, right leg: Secondary | ICD-10-CM | POA: Diagnosis not present

## 2020-03-19 IMAGING — MR MR FOOT*R* W/O CM
5 series · 40 of 40 positions shown · non-contrast
Comparison: Radiographs of [DATE]

CLINICAL DATA: Chronic right foot pain

EXAM:
MRI OF THE RIGHT FOREFOOT WITHOUT CONTRAST
TECHNIQUE: Multiplanar, multisequence MR imaging of the right forefoot was
performed. No intravenous contrast was administered.

[Series 7: T1 · coronal · 3.0mm · 0.53mm/px · 11 of 49 slices shown (1 of 2)]
[im 1/49]
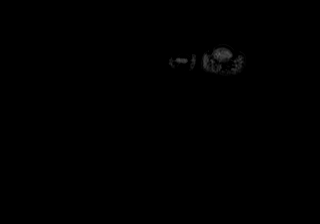
[im 5/49]
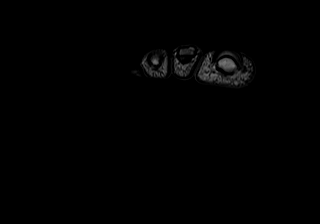
[im 10/49]
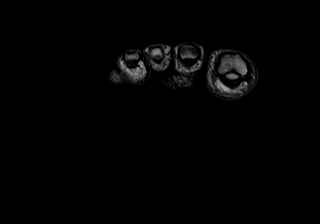
[im 15/49]
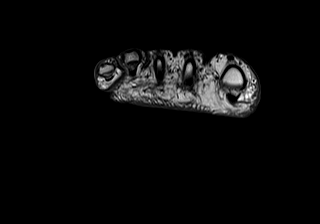
[im 20/49]
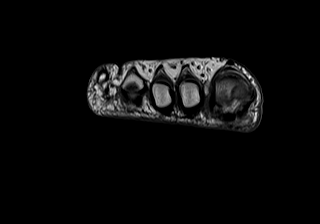
[im 25/49]
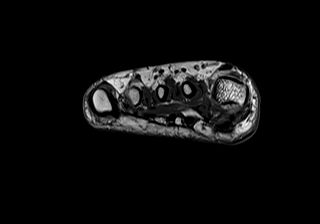
[im 29/49]
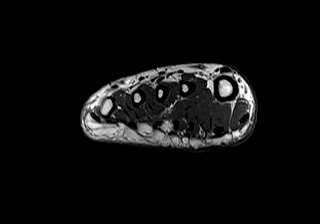
[im 34/49]
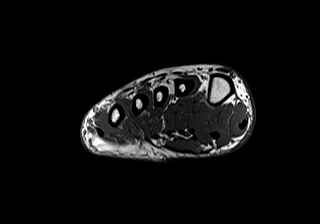
[im 39/49]
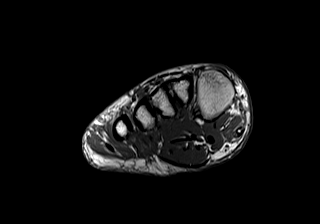
[im 44/49]
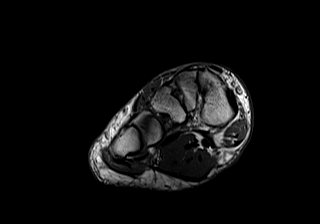
[im 49/49]
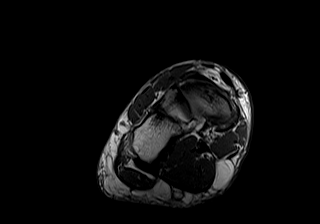

[Series 9: T2 fat-sat · coronal · 3.0mm · 0.53mm/px · 10 of 49 slices shown (1 of 2)]
[im 1/49]
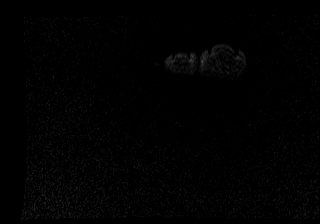
[im 6/49]
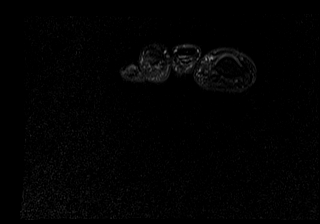
[im 11/49]
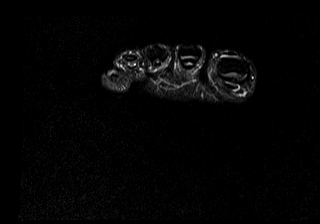
[im 17/49]
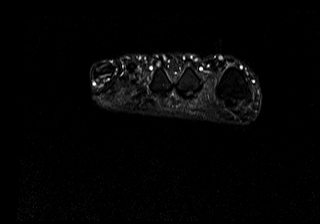
[im 22/49]
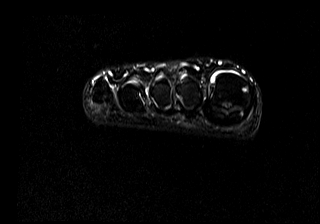
[im 27/49]
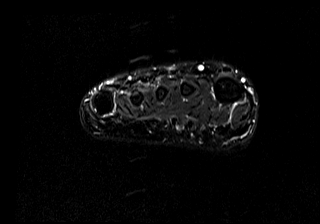
[im 33/49]
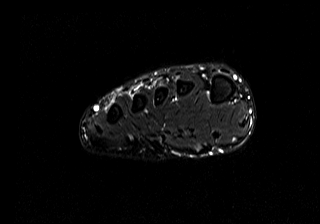
[im 38/49]
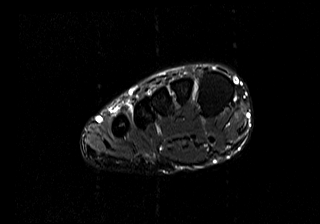
[im 43/49]
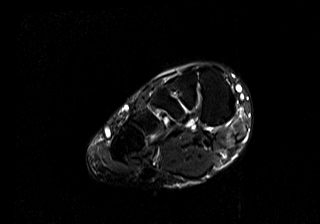
[im 49/49]
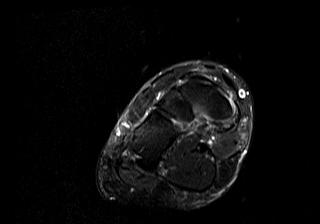

[Series 10: T1 · oblique · 3.0mm · 0.50mm/px · 6 of 28 slices shown (2 of 2)]
[im 1/28]
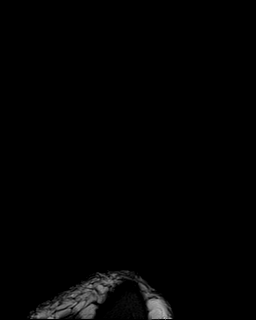
[im 6/28]
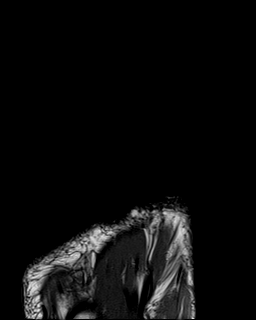
[im 11/28]
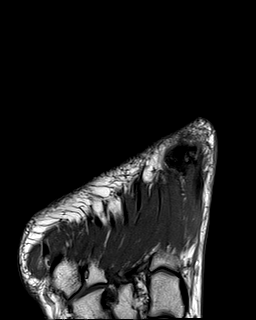
[im 17/28]
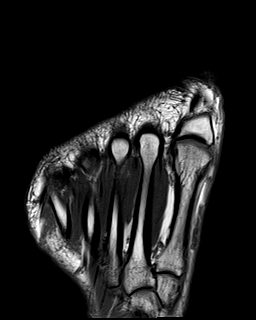
[im 22/28]
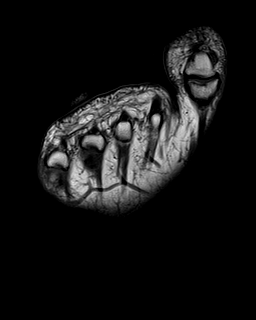
[im 28/28]
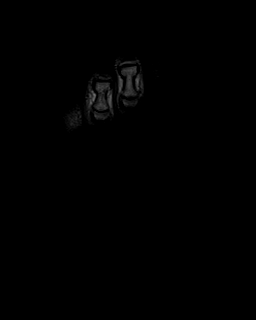

[Series 11: T2 fat-sat · oblique · 3.0mm · 0.62mm/px · 6 of 28 slices shown (2 of 2)]
[im 1/28]
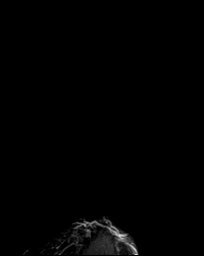
[im 6/28]
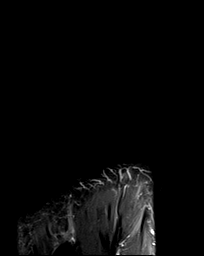
[im 11/28]
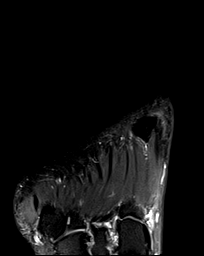
[im 17/28]
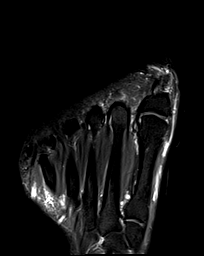
[im 22/28]
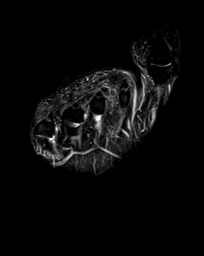
[im 28/28]
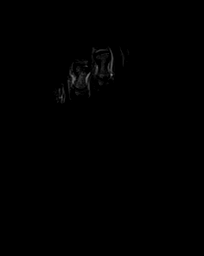

[Series 12: STIR · sagittal · 3.0mm · 0.70mm/px · 7 of 34 slices shown]
[im 1/34]
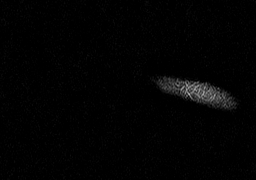
[im 6/34]
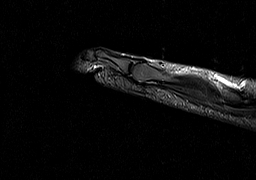
[im 12/34]
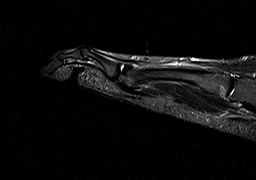
[im 17/34]
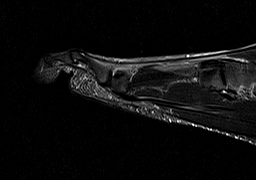
[im 23/34]
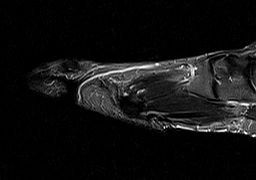
[im 28/34]
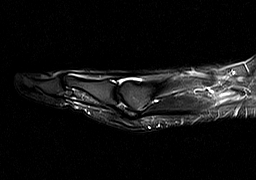
[im 34/34]
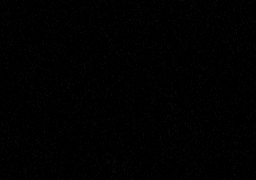

[40 of 40 positions shown; findings below may reference images not displayed]

FINDINGS: Bones/Joint/Cartilage

Mild degenerative findings along the head of the first metatarsal at
the articulation with the sesamoids, with small foci of subcortical
marrow edema in both the medial sesamoid and the plantar first
metatarsal head. Mild distal cortical irregularity of the first
metatarsal head is likely degenerative. Small dorsal effusion of the
first MTP joint.

Ligaments

On images 28-29 of series 9, there is accentuated signal along the
medial margin of the medial sesamoid potentially representing a
small focal disruption of the medial capsular and abductor hallucis
attachment. There is indistinctness of the plantar plate on image 27
of series 12 suspicious for disruption and turf toe injury.

Muscles and Tendons

Unremarkable

Soft tissues

Unremarkable
IMPRESSION: 1. Suspected small focal disruption of the medial capsular and
abductor hallucis attachment to the medial sesamoid of the first
digit. There is also suspected disruption of the plantar plate
centrally favoring turf toe injury.
2. Suspected disruption and turf toe injury.
3. Mild degenerative findings along the head of the first
metatarsal.

## 2020-03-19 IMAGING — MR MR ANKLE*R* W/O CM
5 series · 40 of 40 positions shown · non-contrast
Comparison: [DATE] radiographs
COMPARISON: [DATE] radiographs

Addendum:
CLINICAL DATA: Right ankle and foot pain

EXAM:
MRI OF THE RIGHT ANKLE WITHOUT CONTRAST
TECHNIQUE: Multiplanar, multisequence MR imaging of the ankle was performed. No
intravenous contrast was administered.

[Series 7: PD fat-sat · axial · 3.0mm · 0.62mm/px · z∈[-12,+120]mm · 8 of 34 slices shown]
[im 1/34]
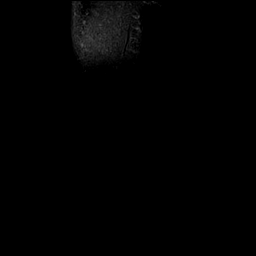
[im 5/34]
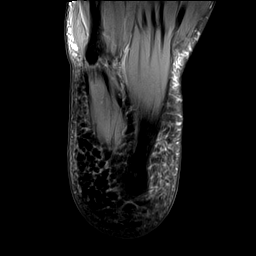
[im 10/34]
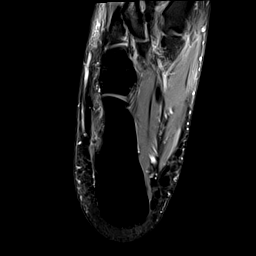
[im 15/34]
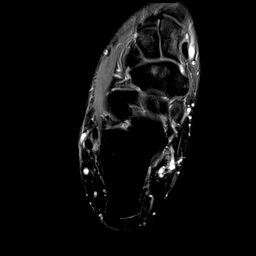
[im 19/34]
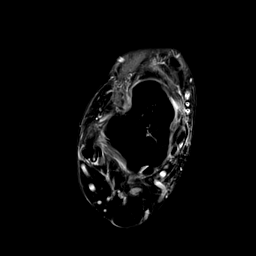
[im 24/34]
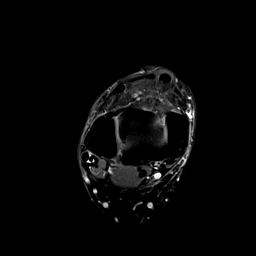
[im 29/34]
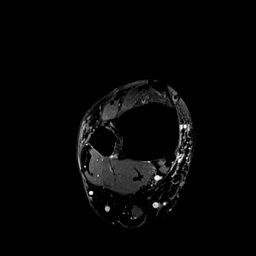
[im 34/34]
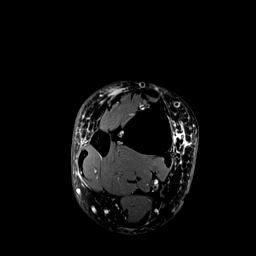

[Series 8: T2 fat-sat · axial · 3.0mm · 0.62mm/px · z∈[-4,+112]mm · 8 of 30 slices shown]
[im 1/30]
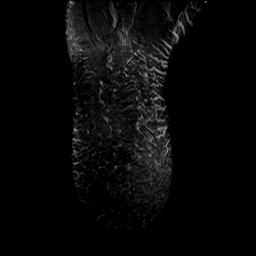
[im 5/30]
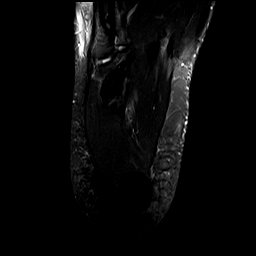
[im 9/30]
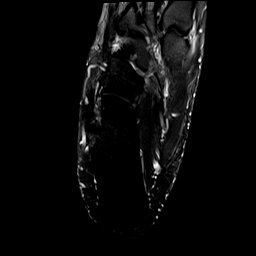
[im 13/30]
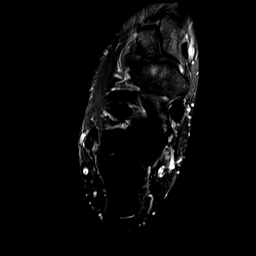
[im 17/30]
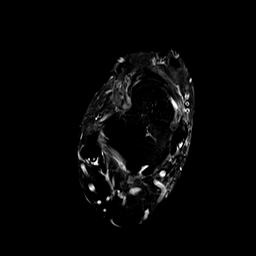
[im 21/30]
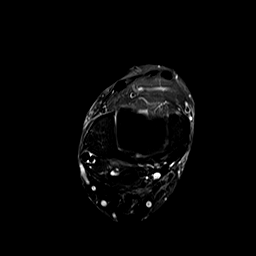
[im 25/30]
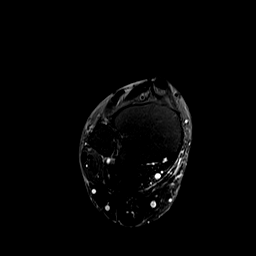
[im 30/30]
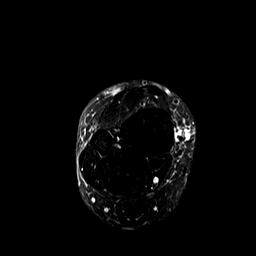

[Series 9: STIR · coronal · 3.0mm · 0.62mm/px · 10 of 38 slices shown (1 of 2)]
[im 1/38]
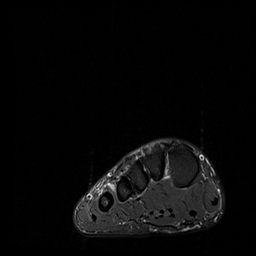
[im 5/38]
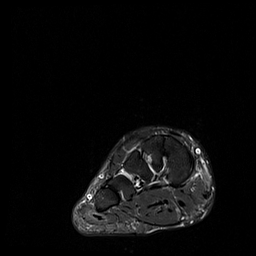
[im 9/38]
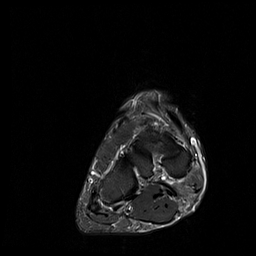
[im 13/38]
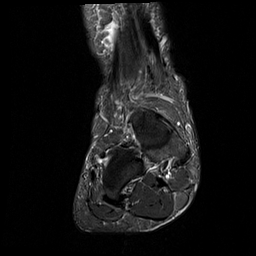
[im 17/38]
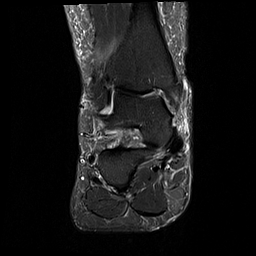
[im 21/38]
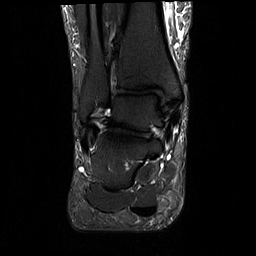
[im 25/38]
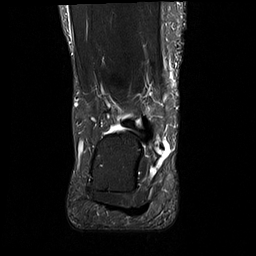
[im 29/38]
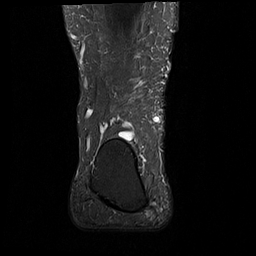
[im 33/38]
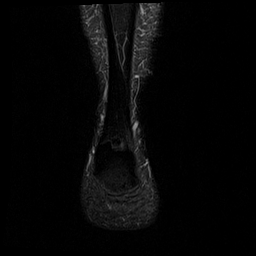
[im 38/38]
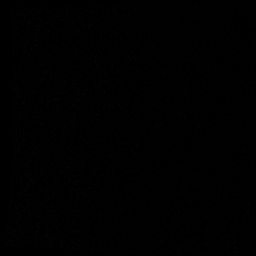

[Series 10: T1 · sagittal · 3.0mm · 0.62mm/px · 7 of 28 slices shown]
[im 1/28]
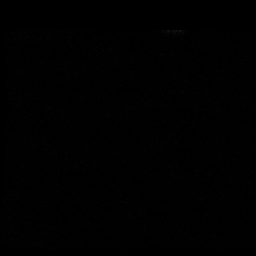
[im 5/28]
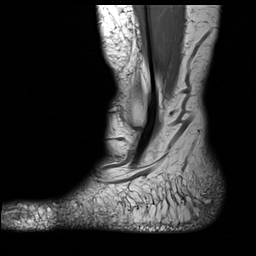
[im 10/28]
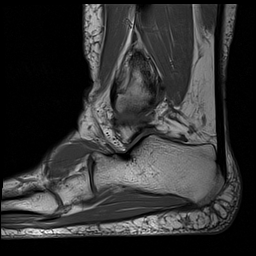
[im 14/28]
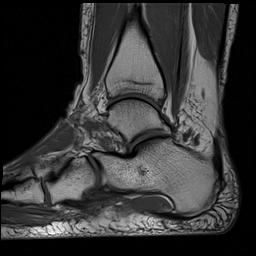
[im 19/28]
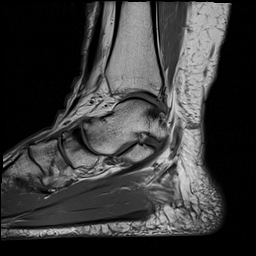
[im 23/28]
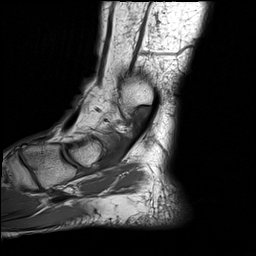
[im 28/28]
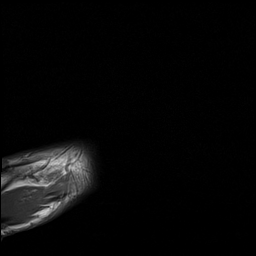

[Series 11: STIR · sagittal · 3.0mm · 0.62mm/px · 7 of 28 slices shown (2 of 2)]
[im 1/28]
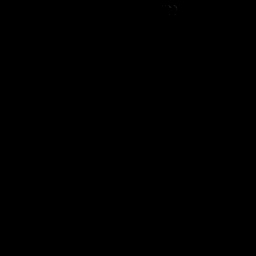
[im 5/28]
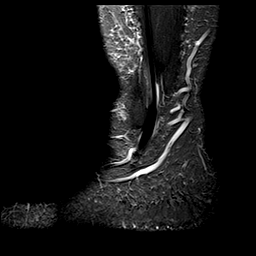
[im 10/28]
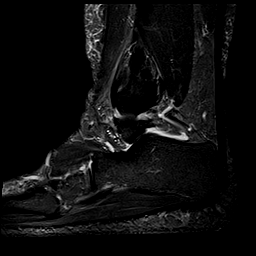
[im 14/28]
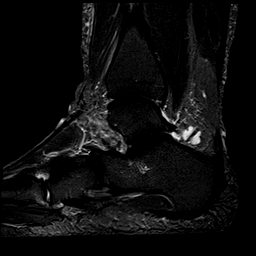
[im 19/28]
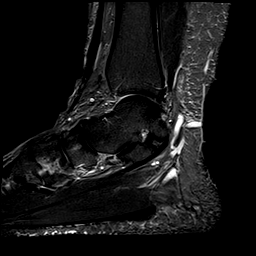
[im 23/28]
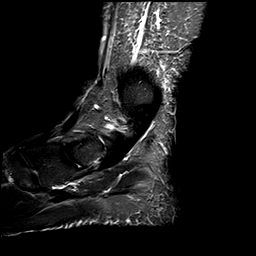
[im 28/28]
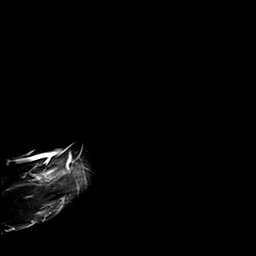

[40 of 40 positions shown; findings below may reference images not displayed]

FINDINGS: TENDONS

Peroneal: Longitudinal split tear of the peroneus brevis with
associated moderate tendinopathy. Peroneus longus intact.

Posteromedial: Distal tibialis posterior tenosynovitis and
tendinopathy.

Anterior: Mild tibialis anterior tendinopathy.

Achilles: Unremarkable

Plantar Fascia: Is abnormally thickened medial band of the plantar
fascia without surrounding edema, but still suspicious for plantar
fasciitis.

LIGAMENTS

Lateral: Intact

Medial: Intact

CARTILAGE

Ankle Joint: Mild tibiotalar spurring. Mild degenerative chondral
thinning in general, with moderate chondral thinning anteriorly
along the medial tibial plafond.

Subtalar Joints/Sinus Tarsi: Small effusion of the posterior
subtalar facet. Mild degenerative spurring.

Bones: Mild dorsal midfoot spurring. Small geode in the navicular
centrally likely arising from the proximal articular surface on
image 20 of series 11, with surrounding reactive findings in the
navicular. Mild degenerative findings at the articulation between
the navicular and the cuneiform is. No compelling findings of
osteomyelitis in the ankle. Small vascular remnant in the calcaneal
neck.

Other: Her low-grade subcutaneous edema overlying the malleoli.
IMPRESSION: 1. Longitudinal split tear of the peroneus brevis with associated
moderate tendinopathy.
2. Distal tibialis posterior tenosynovitis and tendinopathy.
Correlate clinically in assessing for tibialis posterior
dysfunction.
3. Mild tibialis anterior tendinopathy.
4. Mild chondral thinning in the tibiotalar joint, with moderate
chondral thinning anteriorly along the medial tibial plafond. Small
effusion of the posterior subtalar facet.
5. Mild degenerative findings in the midfoot and hindfoot.
6. Abnormal thickening of the medial band of the plantar fascia
without surrounding edema, but still suspicious for plantar
fasciitis.

ADDENDUM:
The original report was by Dr. ROSIMO. The following
addendum is by Dr. ROSIMO:

There is one other finding of potential node. Along the anteromedial
margin of the flexor hallucis longus tendon and in the vicinity of
the tarsal tunnel, a 0.8 by 0.5 by 0.6 cm fluid signal intensity
structure is present and mildly medially displaces the medial
plantar nerve as seen for example on image 19 of series 7. This is
probably a small ganglion cyst and could be impinging on the medial
plantar nerve.

*** End of Addendum ***
FINDINGS: TENDONS

Peroneal: Longitudinal split tear of the peroneus brevis with
associated moderate tendinopathy. Peroneus longus intact.

Posteromedial: Distal tibialis posterior tenosynovitis and
tendinopathy.

Anterior: Mild tibialis anterior tendinopathy.

Achilles: Unremarkable

Plantar Fascia: Is abnormally thickened medial band of the plantar
fascia without surrounding edema, but still suspicious for plantar
fasciitis.

LIGAMENTS

Lateral: Intact

Medial: Intact

CARTILAGE

Ankle Joint: Mild tibiotalar spurring. Mild degenerative chondral
thinning in general, with moderate chondral thinning anteriorly
along the medial tibial plafond.

Subtalar Joints/Sinus Tarsi: Small effusion of the posterior
subtalar facet. Mild degenerative spurring.

Bones: Mild dorsal midfoot spurring. Small geode in the navicular
centrally likely arising from the proximal articular surface on
image 20 of series 11, with surrounding reactive findings in the
navicular. Mild degenerative findings at the articulation between
the navicular and the cuneiform is. No compelling findings of
osteomyelitis in the ankle. Small vascular remnant in the calcaneal
neck.

Other: Her low-grade subcutaneous edema overlying the malleoli.
IMPRESSION: 1. Longitudinal split tear of the peroneus brevis with associated
moderate tendinopathy.
2. Distal tibialis posterior tenosynovitis and tendinopathy.
Correlate clinically in assessing for tibialis posterior
dysfunction.
3. Mild tibialis anterior tendinopathy.
4. Mild chondral thinning in the tibiotalar joint, with moderate
chondral thinning anteriorly along the medial tibial plafond. Small
effusion of the posterior subtalar facet.
5. Mild degenerative findings in the midfoot and hindfoot.
6. Abnormal thickening of the medial band of the plantar fascia
without surrounding edema, but still suspicious for plantar
fasciitis.

## 2020-03-19 IMAGING — MR MR ANKLE*L* W/O CM
5 series · 40 of 40 positions shown · non-contrast
Comparison: Radiographs [DATE]

CLINICAL DATA: Left foot and left ankle pain over the last 6
months.

EXAM:
MRI OF THE LEFT ANKLE WITHOUT CONTRAST
TECHNIQUE: Multiplanar, multisequence MR imaging of the ankle was performed. No
intravenous contrast was administered.

[Series 5: T2 fat-sat · axial · 3.0mm · 0.62mm/px · z∈[-6,+130]mm · 9 of 35 slices shown]
[im 1/35]
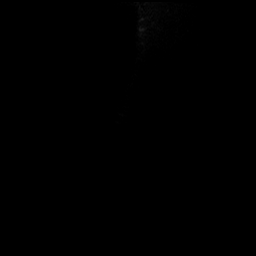
[im 5/35]
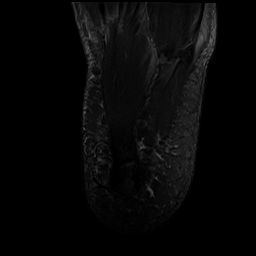
[im 9/35]
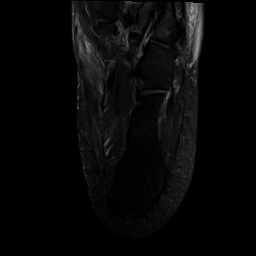
[im 13/35]
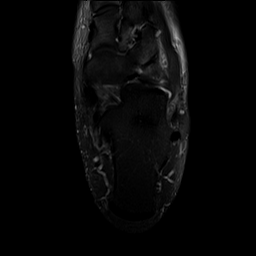
[im 18/35]
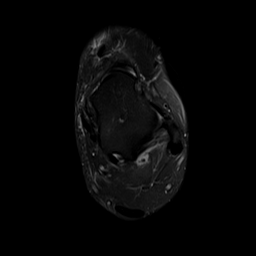
[im 22/35]
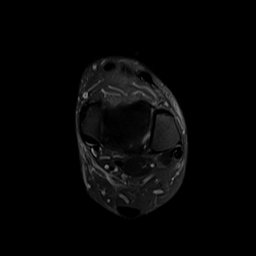
[im 26/35]
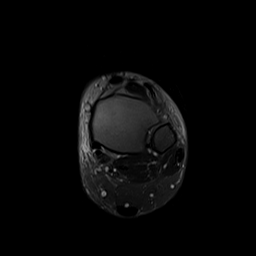
[im 30/35]
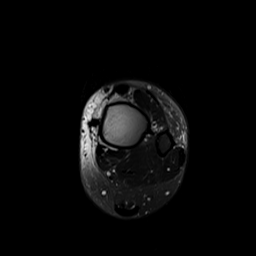
[im 35/35]
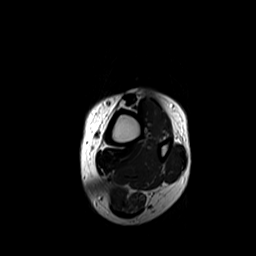

[Series 6: PD fat-sat · axial · 3.0mm · 0.62mm/px · z∈[-6,+130]mm · 8 of 34 slices shown]
[im 1/34]
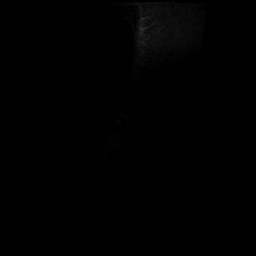
[im 5/34]
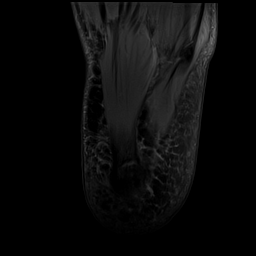
[im 10/34]
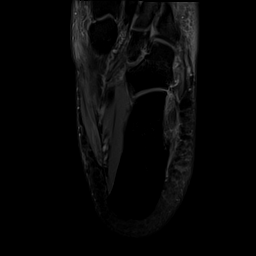
[im 15/34]
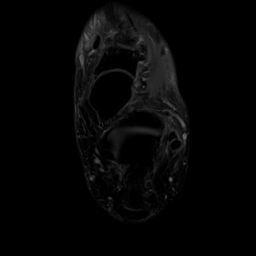
[im 19/34]
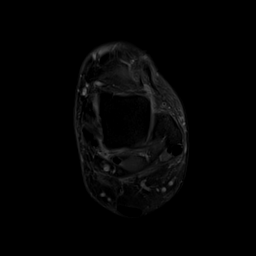
[im 24/34]
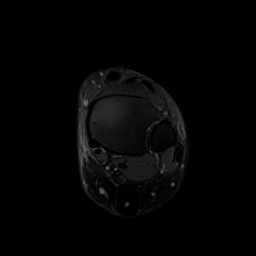
[im 29/34]
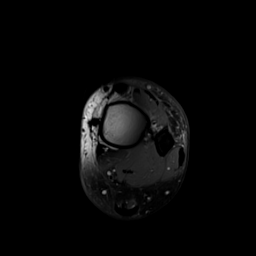
[im 34/34]
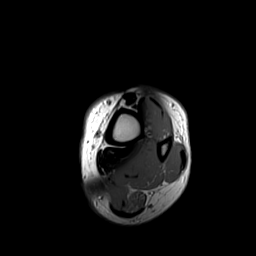

[Series 7: STIR · coronal · 3.0mm · 0.62mm/px · 9 of 38 slices shown (1 of 2)]
[im 1/38]
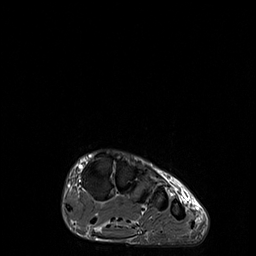
[im 5/38]
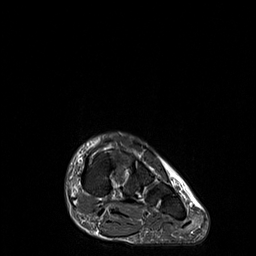
[im 10/38]
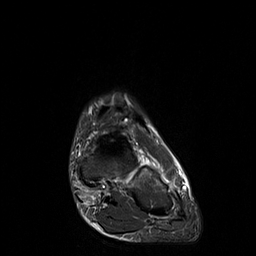
[im 14/38]
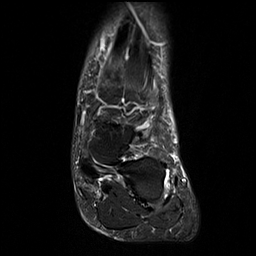
[im 19/38]
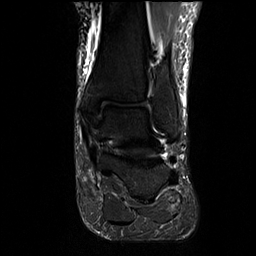
[im 24/38]
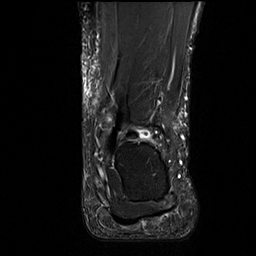
[im 28/38]
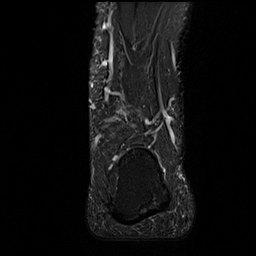
[im 33/38]
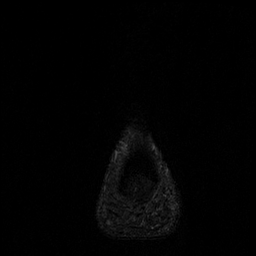
[im 38/38]
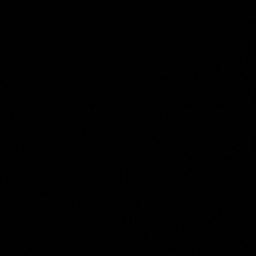

[Series 8: T1 · sagittal · 3.0mm · 0.62mm/px · 7 of 28 slices shown]
[im 1/28]
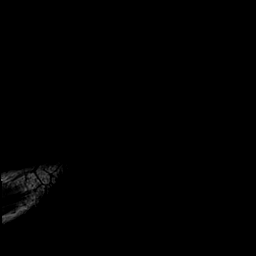
[im 5/28]
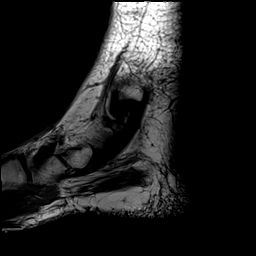
[im 10/28]
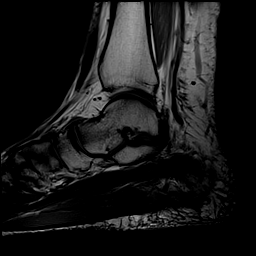
[im 14/28]
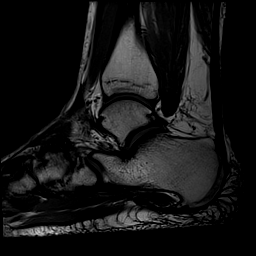
[im 19/28]
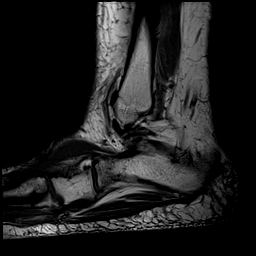
[im 23/28]
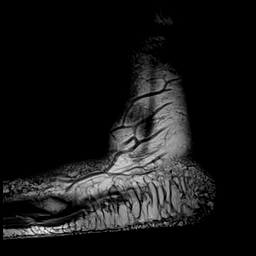
[im 28/28]
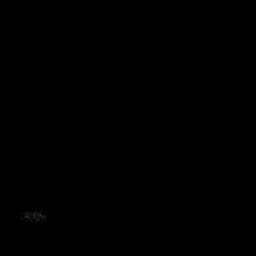

[Series 9: STIR · sagittal · 3.0mm · 0.62mm/px · 7 of 27 slices shown (2 of 2)]
[im 1/27]
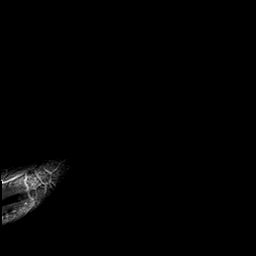
[im 5/27]
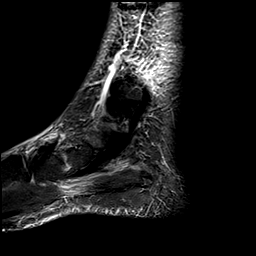
[im 9/27]
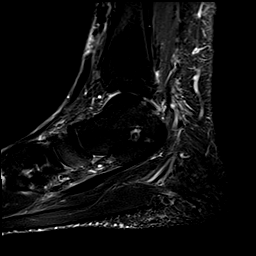
[im 14/27]
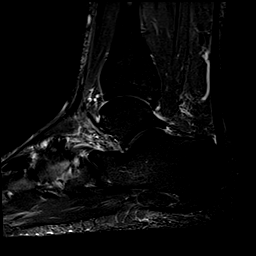
[im 18/27]
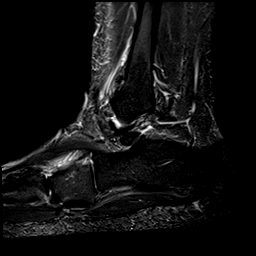
[im 22/27]
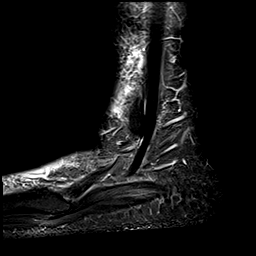
[im 27/27]
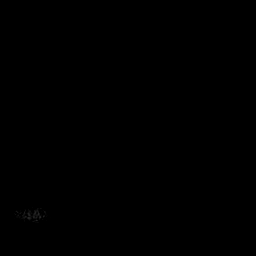

[40 of 40 positions shown; findings below may reference images not displayed]

FINDINGS: TENDONS

Peroneal: Unremarkable

Posteromedial: Unremarkable

Anterior: Unremarkable

Achilles: Unremarkable

Plantar Fascia: Mild thickening of the medial and lateral bands of
the plantar fascia suggesting mild plantar fasciitis.

LIGAMENTS

Lateral: Unremarkable

Medial: Unremarkable

CARTILAGE

Ankle Joint: Unremarkable

Subtalar Joints/Sinus Tarsi: Mild spurring noted.

Bones: Mild dorsal midfoot spurring. Mild degenerative spurring and
subcortical marrow edema along the articulation between the lateral
cuneiform and cuboid, and to a lesser extent between the cuneiform
spur.

Other: Low-level edema involving the abductor hallucis muscle.
IMPRESSION: 1. Mild thickening of the medial and lateral bands of the plantar
fascia suggesting mild plantar fasciitis.
2. Low-level edema involving the abductor hallucis muscle.
3. Mild dorsal midfoot spurring.

## 2020-03-19 IMAGING — MR MR FOOT*L* W/O CM
5 series · 40 of 40 positions shown · non-contrast
Comparison: Radiographs from [DATE]

CLINICAL DATA: Chronic left foot pain over the last 6 months

EXAM:
MRI OF THE LEFT FOOT WITHOUT CONTRAST
TECHNIQUE: Multiplanar, multisequence MR imaging of the left forefoot was
performed. No intravenous contrast was administered.

[Series 8: T2 fat-sat · coronal · 3.0mm · 0.53mm/px · 10 of 47 slices shown (1 of 2)]
[im 1/47]
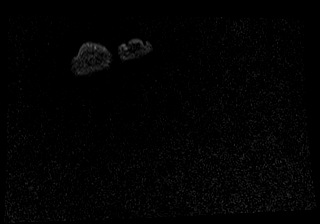
[im 6/47]
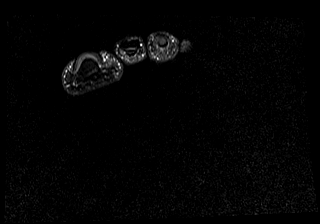
[im 11/47]
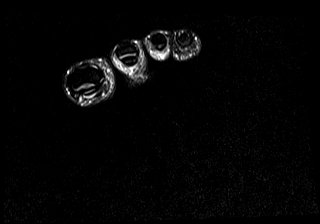
[im 16/47]
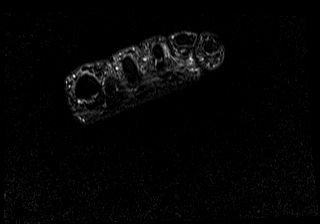
[im 21/47]
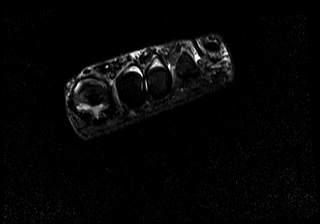
[im 26/47]
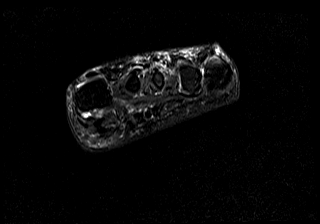
[im 31/47]
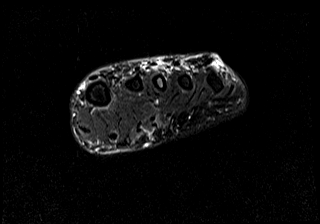
[im 36/47]
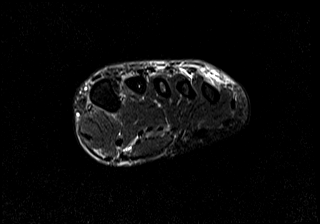
[im 41/47]
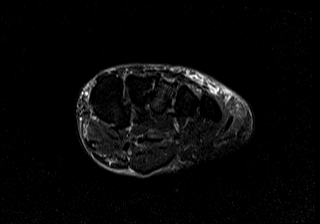
[im 47/47]
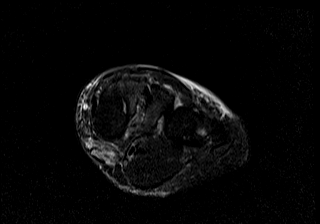

[Series 9: T1 · coronal · 3.0mm · 0.53mm/px · 10 of 47 slices shown (1 of 2)]
[im 1/47]
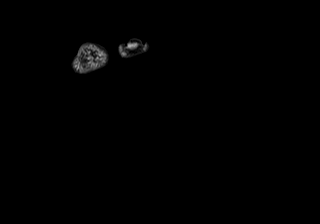
[im 6/47]
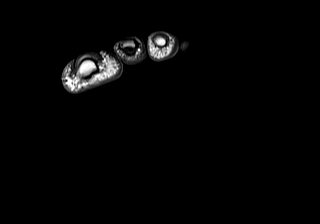
[im 11/47]
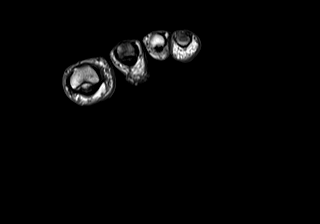
[im 16/47]
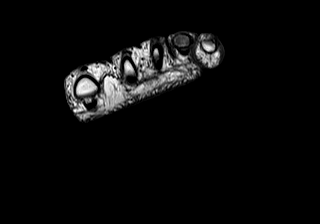
[im 21/47]
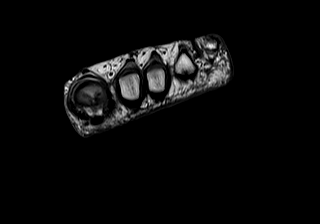
[im 26/47]
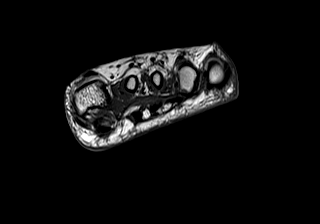
[im 31/47]
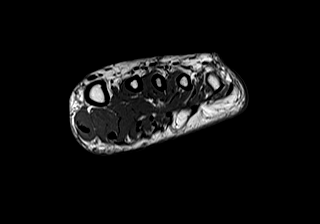
[im 36/47]
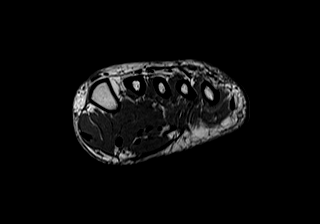
[im 41/47]
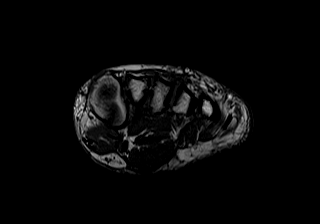
[im 47/47]
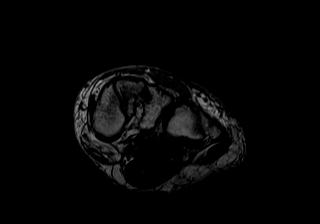

[Series 10: T1 · axial · 3.0mm · 0.50mm/px · z∈[-42,+24]mm · 6 of 26 slices shown (2 of 2)]
[im 1/26]
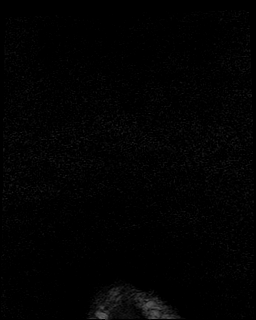
[im 6/26]
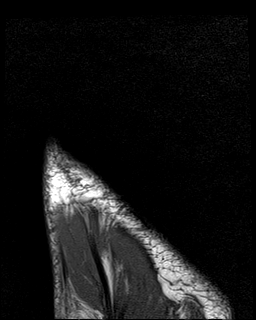
[im 11/26]
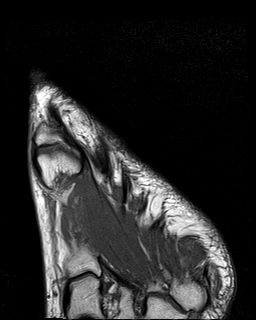
[im 16/26]
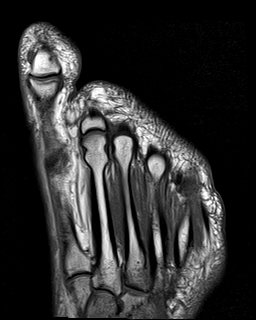
[im 21/26]
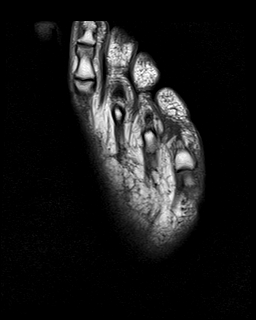
[im 26/26]
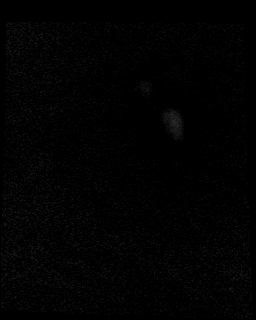

[Series 11: T2 fat-sat · axial · 3.0mm · 0.62mm/px · z∈[-42,+24]mm · 6 of 26 slices shown (2 of 2)]
[im 1/26]
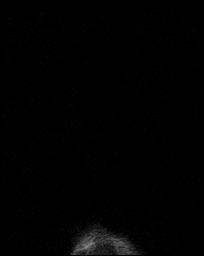
[im 6/26]
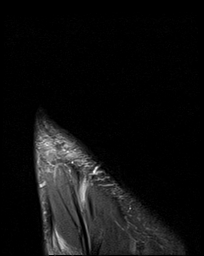
[im 11/26]
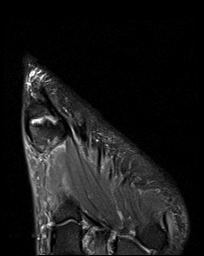
[im 16/26]
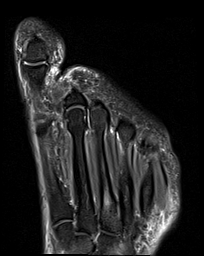
[im 21/26]
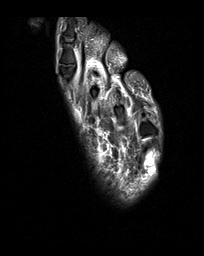
[im 26/26]
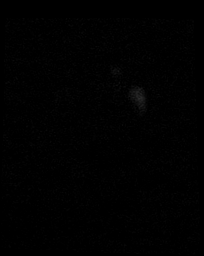

[Series 12: STIR · sagittal · 3.0mm · 0.70mm/px · 8 of 34 slices shown]
[im 1/34]
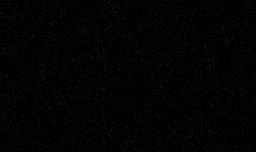
[im 5/34]
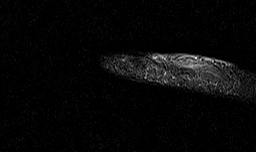
[im 10/34]
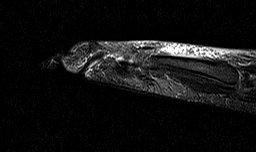
[im 15/34]
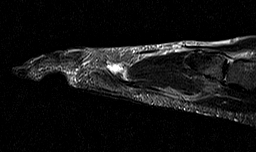
[im 19/34]
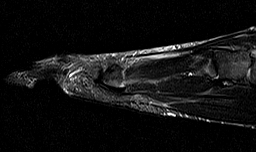
[im 24/34]
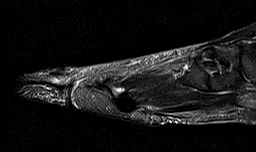
[im 29/34]
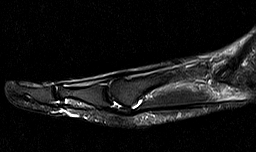
[im 34/34]
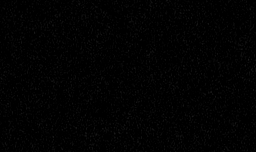

[40 of 40 positions shown; findings below may reference images not displayed]

FINDINGS: Bones/Joint/Cartilage

Low-grade abnormal marrow edema in the shaft and proximal metaphysis
of the third metatarsal favoring a mild stress fracture in this
clinical scenario. No well-defined completed fracture is identified.

Mild degenerative spurring at the Lisfranc joint.

Ligaments

The Lisfranc ligament appears intact.

Muscles and Tendons

Atrophy and low-grade edema in the abductor hallucis muscle

Soft tissues

Mild dorsal subcutaneous edema in the foot tracking into the toes.
IMPRESSION: 1. Low-grade abnormal marrow edema in the shaft and proximal
metaphysis of the third metatarsal favoring a mild stress fracture
in this clinical scenario.
2. Mild dorsal subcutaneous edema in the foot tracking into the
toes.
3. Atrophy and low-grade edema in the abductor hallucis muscle.

## 2020-03-20 ENCOUNTER — Encounter: Payer: Self-pay | Admitting: Family Medicine

## 2020-03-21 MED ORDER — GABAPENTIN 100 MG PO CAPS: 200.0000 mg | ORAL_CAPSULE | Freq: Every day | ORAL | 3 refills | Status: DC

## 2020-03-27 ENCOUNTER — Ambulatory Visit: Payer: BC Managed Care – PPO

## 2020-03-28 ENCOUNTER — Encounter: Payer: Self-pay | Admitting: Family Medicine

## 2020-03-29 NOTE — Progress Notes (Signed)
[facility] [address] [place] Phone: [phone] Subjective:   I Kandace Blitz am serving as a Education administrator for Dr. Hulan Saas.  This visit occurred during the SARS-CoV-2 public health emergency.  Safety protocols were in place, including screening questions prior to the visit, additional usage of staff PPE, and extensive cleaning of exam room while observing appropriate contact time as indicated for disinfecting solutions.   I'm seeing this patient by the request  of:  Antony Contras, MD  CC: Foot and ankle pain follow-up.  ERD:[id number]   03/07/2020 Editor: Lyndal Pulley, DO (Physician)              Patient has had a sprain previously but I am concerned for more than arthritic changes at this time.  Patient is x-rays were fairly unremarkable and I think further evaluation is necessary.  Laboratory work-up has showed only the low vitamin D.  At this time with the chronic pain and going on 7 months of treatment including cam walkers, custom orthotics, home exercises and physical therapy with intermittent improvement but then worsening I do feel that further evaluation with MRI is necessary.     Patient on ultrasound today does have some inflammation noted of the first MTP.  Seems to be more turf toe and likely some compensation.  Patient has pain that seems to continue to give her more difficulty.  Patient was to continue to be active and is wanting to lose weight.  Get MRIs to further evaluate patient has been somewhat noncompliant and very active still significant improvement would be noted at this time usually we are not.  Patient did have a bone density that was unremarkable  Update 03/30/2020 Londin Antone is a 45 y.o. female coming in with complaint of bilateral foot pain. Patient states the left foot was feeling better. States she has pain today. Right foot is not happy. Wants to get out of the boot but understands that she may have  to stay in it.  Patient's left foot does have pain so over the third metatarsal.  Patient did have MRIs of the ankles and feet bilaterally.  Left foot showed that patient did have a stress reaction of the third metatarsal.  Right foot showed that patient did have disruption of the first MTP with arthritic changes.  Patient states that the large toe is severe and is actually what gives her more the pain at this moment.     Past Medical History:  Diagnosis Date  . Arthritis    knees  . Asthma    as a child  . Back pain   . Food allergy    Tannin/sulfate in [place] and ciders, rash on face and hands  . Joint pain   . Lower extremity edema   . MMT (medial meniscus tear)    right  . Sleep apnea    CPAP nightly  . Umbilical hernia    Past Surgical History:  Procedure Laterality Date  . BILATERAL SALPINGECTOMY Bilateral 04/15/2018   Procedure: BILATERAL SALPINGECTOMY;  Surgeon: Bobbye Charleston, MD;  Location: [facility];  Service: Obstetrics;  Laterality: Bilateral;  . CESAREAN SECTION  11/12/2010   Procedure: CESAREAN SECTION;  Surgeon: Marcial Pacas;  Location: [facility];  Service: Gynecology;  Laterality: N/A;  . CESAREAN SECTION N/A 04/15/2018   Procedure: CESAREAN SECTION;  Surgeon: Bobbye Charleston, MD;  Location: [facility];  Service: Obstetrics;  Laterality: N/A;  . KNEE ARTHROSCOPY  Right 02/11/2017   Procedure: RIGHT KNEE ARTHROSCOPY WITH DEBRIDEMENT CHONDROMALACIA;  Surgeon: Gean Birchwood, MD;  Location: [facility];  Service: Orthopedics;  Laterality: Right;   Social History   Socioeconomic History  . Marital status: Married    Spouse name: Leni Pankonin  . Number of children: Not on file  . Years of education: Not on file  . Highest education level: Not on file  Occupational History  . Occupation: Pastor  Tobacco Use  . Smoking status: Never Smoker  . Smokeless tobacco: Never Used  Vaping Use  . Vaping Use: Never used  Substance and  Sexual Activity  . Alcohol use: Yes    Comment: social  . Drug use: No  . Sexual activity: Yes    Birth control/protection: None  Other Topics Concern  . Not on file  Social History Narrative  . Not on file   Social Determinants of Health   Financial Resource Strain: Not on file  Food Insecurity: Not on file  Transportation Needs: Not on file  Physical Activity: Not on file  Stress: Not on file  Social Connections: Not on file   No Known Allergies Family History  Problem Relation Age of Onset  . Hypertension Father   . Hyperlipidemia Father   . Heart disease Father   . Cancer Maternal Grandfather   . Heart disease Paternal Grandfather   . Pulmonary embolism Mother   . Alcohol abuse Mother     Current Outpatient Medications (Endocrine & Metabolic):  .  levonorgestrel (MIRENA) 20 MCG/24HR IUD, 1 each by Intrauterine route once.   Current Outpatient Medications (Respiratory):  .  cetirizine (ZYRTEC) 10 MG tablet, Take 10 mg by mouth daily. .  fluticasone (FLONASE) 50 MCG/ACT nasal spray, Place 1 spray into both nostrils daily.  Current Outpatient Medications (Analgesics):  .  acetaminophen (TYLENOL) 500 MG tablet, Take 500 mg by mouth every 6 (six) hours as needed. Marland Kitchen  ibuprofen (ADVIL) 200 MG tablet, Take 200 mg by mouth every 6 (six) hours as needed.  Current Outpatient Medications (Hematological):  .  tranexamic acid (LYSTEDA) 650 MG TABS tablet, Take 1,300 mg by mouth. Take one tablet by mouth three times a day during menses  Current Outpatient Medications (Other):  .  gabapentin (NEURONTIN) 100 MG capsule, Take 2 capsules (200 mg total) by mouth at bedtime. .  Lisdexamfetamine Dimesylate (VYVANSE) 40 MG CHEW, Chew 1 each by mouth daily. .  Multiple Vitamins-Minerals (WOMENS MULTI VITAMIN & MINERAL PO), Take 1 tablet by mouth daily. Marland Kitchen  triamcinolone cream (KENALOG) 0.1 %, Apply 1 application topically 2 (two) times daily as needed. .  Vitamin D, Ergocalciferol,  (DRISDOL) 1.25 MG (50000 UNIT) CAPS capsule, Take 1 capsule (50,000 Units total) by mouth every 7 (seven) days. .  vitamin k 100 MCG tablet, Take 200 mcg by mouth daily.   Reviewed prior external information including notes and imaging from  primary care provider As well as notes that were available from care everywhere and other healthcare systems.  Past medical history, social, surgical and family history all reviewed in electronic medical record.  No pertanent information unless stated regarding to the chief complaint.   Review of Systems:  No headache, visual changes, nausea, vomiting, diarrhea, constipation, dizziness, abdominal pain, skin rash, fevers, chills, night sweats, weight loss, swollen lymph nodes, body aches, joint swelling, chest pain, shortness of breath, mood changes. POSITIVE muscle aches  Objective  unknown if currently breastfeeding.   General: No apparent distress alert  and oriented x3 mood and affect normal, dressed appropriately.  HEENT: Pupils equal, extraocular movements intact  Respiratory: Patient's speak in full sentences and does not appear short of breath  Cardiovascular: No lower extremity edema, non tender, no erythema  Mild antalgic gait Right foot exam shows the pes planus noted with the break in the transverse arch.  Tender to palpation over the first metatarsal.  No pain over the midfoot at this time.  No significant stiffness of the ankle.  Does have limitation in dorsiflexion of the first metatarsal of 10 degrees  Left foot still tender to palpation over the third and fourth metatarsals dorsally.  More proximally than distally.  Patient does have improvement in range of motion of the left ankle.  Procedure: Real-time Ultrasound Guided Injection of right first metatarsal  device: GE Logiq Q7 Ultrasound guided injection is preferred based studies that show increased duration, increased effect, greater accuracy, decreased procedural pain, increased  response rate, and decreased cost with ultrasound guided versus blind injection.  Verbal informed consent obtained.  Time-out conducted.  Noted no overlying erythema, induration, or other signs of local infection.  Skin prepped in a sterile fashion.  Local anesthesia: Topical Ethyl chloride.  With sterile technique and under real time ultrasound guidance: With a 25-gauge half inch needle injected 0.5 cc of 0.5% Marcaine and 0.5 cc of Kenalog 40 mg/mL into the first MTP Completed without difficulty  Pain immediately improved suggesting accurate placement of the medication.  Advised to call if fevers/chills, erythema, induration, drainage, or persistent bleeding.  Impression: Technically successful ultrasound guided injection.    Impression and Recommendations:     The above documentation has been reviewed and is accurate and complete [name]

## 2020-03-30 ENCOUNTER — Other Ambulatory Visit: Payer: Self-pay

## 2020-03-30 ENCOUNTER — Encounter: Payer: Self-pay | Admitting: Family Medicine

## 2020-03-30 ENCOUNTER — Ambulatory Visit: Payer: BC Managed Care – PPO | Admitting: Family Medicine

## 2020-03-30 DIAGNOSIS — M84375G Stress fracture, left foot, subsequent encounter for fracture with delayed healing: Secondary | ICD-10-CM

## 2020-03-30 DIAGNOSIS — M19071 Primary osteoarthritis, right ankle and foot: Secondary | ICD-10-CM | POA: Diagnosis not present

## 2020-03-30 NOTE — Assessment & Plan Note (Signed)
Confirmed on MRI.  Continue once weekly vitamin D K2.  Discussed carbon fiber plate.  Patient has had now multiple stress fractures and this has been going on now greater than 12 weeks.  We will see if we can get a bone stimulator for patient.  I think this this will be beneficial.  Follow-up with me again 4 to 6 weeks

## 2020-03-30 NOTE — Assessment & Plan Note (Signed)
Patient does have disruption of the capsule noted on MRI.  Patient though does not want any surgical intervention.  Has significant swelling on ultrasound and given injection today.  Optimistic that patient should do relatively well.  Discussed topical anti-inflammatories and proper shoes, patient has custom orthotics.  Follow-up with me again 4 to 6 weeks

## 2020-03-30 NOTE — Patient Instructions (Addendum)
Good to see you Carbon fiber plate in the shoe Wear boot to [facility] Bone stimulator if they deny it call me immediately  Injected 1st toe on the right side See me again in 4-6 weeks

## 2020-04-02 ENCOUNTER — Encounter: Payer: Self-pay | Admitting: Family Medicine

## 2020-04-02 ENCOUNTER — Encounter (INDEPENDENT_AMBULATORY_CARE_PROVIDER_SITE_OTHER): Payer: Self-pay | Admitting: Family Medicine

## 2020-04-02 ENCOUNTER — Other Ambulatory Visit: Payer: Self-pay

## 2020-04-02 ENCOUNTER — Ambulatory Visit (INDEPENDENT_AMBULATORY_CARE_PROVIDER_SITE_OTHER): Payer: BC Managed Care – PPO | Admitting: Family Medicine

## 2020-04-02 VITALS — BP 138/71 | HR 67 | Temp 97.8°F | Ht 66.0 in | Wt 229.0 lb

## 2020-04-02 DIAGNOSIS — Z9189 Other specified personal risk factors, not elsewhere classified: Secondary | ICD-10-CM | POA: Diagnosis not present

## 2020-04-02 DIAGNOSIS — N920 Excessive and frequent menstruation with regular cycle: Secondary | ICD-10-CM | POA: Insufficient documentation

## 2020-04-02 DIAGNOSIS — Z6837 Body mass index (BMI) 37.0-37.9, adult: Secondary | ICD-10-CM

## 2020-04-02 DIAGNOSIS — E6609 Other obesity due to excess calories: Secondary | ICD-10-CM

## 2020-04-02 DIAGNOSIS — F5081 Binge eating disorder: Secondary | ICD-10-CM | POA: Diagnosis not present

## 2020-04-02 DIAGNOSIS — G4733 Obstructive sleep apnea (adult) (pediatric): Secondary | ICD-10-CM | POA: Insufficient documentation

## 2020-04-02 DIAGNOSIS — M79672 Pain in left foot: Secondary | ICD-10-CM | POA: Diagnosis not present

## 2020-04-02 DIAGNOSIS — E559 Vitamin D deficiency, unspecified: Secondary | ICD-10-CM | POA: Insufficient documentation

## 2020-04-02 DIAGNOSIS — E88819 Insulin resistance, unspecified: Secondary | ICD-10-CM | POA: Insufficient documentation

## 2020-04-02 DIAGNOSIS — E8881 Metabolic syndrome: Secondary | ICD-10-CM | POA: Insufficient documentation

## 2020-04-02 DIAGNOSIS — R632 Polyphagia: Secondary | ICD-10-CM

## 2020-04-02 DIAGNOSIS — G8929 Other chronic pain: Secondary | ICD-10-CM

## 2020-04-02 DIAGNOSIS — F50811 Binge eating disorder, moderate: Secondary | ICD-10-CM | POA: Insufficient documentation

## 2020-04-02 HISTORY — DX: Excessive and frequent menstruation with regular cycle: N92.0

## 2020-04-02 NOTE — Progress Notes (Signed)
Chief Complaint:   OBESITY Katie Glass is here to discuss her progress with her obesity treatment plan along with follow-up of her obesity related diagnoses.   Today's visit was #: 15 Starting weight: 268 lbs Starting date: 09/21/2019 Today's weight: 229 lbs Today's date: 04/02/2020 Total lbs lost to date: 39 lbs Body mass index is 36.96 kg/m.  Total weight loss percentage to date: -14.55%  Interim History: Katie Glass has requested a medical note stating she is unable to stand in line while at [facility] due to foot fracture. She has held off on walking and regular boot camp since her most recent appointment with Sports Medicine. Nutrition Plan: the Category 4 Plan for 80% of the time.  Hunger is well controlled. Cravings are well controlled.  Activity: boot camp/HIIT/cardio for 45-60 minutes 4 times per week.  Assessment/Plan:   1. Polyphagia Controlled on current treatment. She will continue to focus on protein-rich, low simple carbohydrate foods. We reviewed the importance of hydration, regular exercise for stress reduction, and restorative sleep.  2. Chronic foot pain, left Reviewed Sports Medicine notes. We will continue to monitor symptoms as they relate to her weight loss journey.  3. Binge eating disorder Improving. She will continue to focus on protein-rich, low simple carbohydrate foods. We reviewed the importance of hydration, regular exercise for stress reduction, and restorative sleep. The current medical regimen is effective;  continue present plan and medications.  Meds ordered this encounter  Medications  . Lisdexamfetamine Dimesylate (VYVANSE) 40 MG CHEW    Sig: Chew 1 each by mouth daily.    Dispense:  90 tablet    Refill:  0   4. At risk for activity intolerance Katie Glass was given approximately 15 minutes of exercise intolerance counseling today. She is 45 y.o. female and has risk factors exercise intolerance including obesity. We discussed intensive  lifestyle modifications today with an emphasis on specific weight loss instructions and strategies. Katie Glass will modify exercise until cleared by Sports Medicine.  5. Class 2 obesity due to excess calories without serious comorbidity with body mass index (BMI) of 37.0 to 37.9 in adult  Course: Katie Glass is currently in the action stage of change. As such, her goal is to continue with weight loss efforts.   Nutrition goals: She has agreed to the Category 4 Plan.   Exercise goals: For substantial health benefits, adults should do at least 150 minutes (2 hours and 30 minutes) a week of moderate-intensity, or 75 minutes (1 hour and 15 minutes) a week of vigorous-intensity aerobic physical activity, or an equivalent combination of moderate- and vigorous-intensity aerobic activity. Aerobic activity should be performed in episodes of at least 10 minutes, and preferably, it should be spread throughout the week.  Behavioral modification strategies: increasing lean protein intake, decreasing simple carbohydrates, increasing vegetables, increasing water intake and travel eating strategies.  Katie Glass has agreed to follow-up with our clinic in 4 weeks. She was informed of the importance of frequent follow-up visits to maximize her success with intensive lifestyle modifications for her multiple health conditions.   Objective:   Blood pressure 138/71, pulse 67, temperature 97.8 F (36.6 C), temperature source Oral, height 5\' 6"  (1.676 m), weight 229 lb (103.9 kg), SpO2 100 %, unknown if currently breastfeeding. Body mass index is 36.96 kg/m.  General: Cooperative, alert, well developed, in no acute distress. HEENT: Conjunctivae and lids unremarkable. Cardiovascular: Regular rhythm.  Lungs: Normal work of breathing. Neurologic: No focal deficits.   Lab Results  Component Value  Date   CREATININE 0.87 09/21/2019   BUN 13 09/21/2019   NA 142 09/21/2019   K 4.2 09/21/2019   CL 102 09/21/2019   CO2 22  09/21/2019   Lab Results  Component Value Date   ALT 9 09/21/2019   AST 14 09/21/2019   ALKPHOS 45 (L) 09/21/2019   BILITOT 0.6 09/21/2019   Lab Results  Component Value Date   HGBA1C 5.2 09/21/2019   HGBA1C 5.4 05/17/2019   Lab Results  Component Value Date   INSULIN 7.6 09/21/2019   INSULIN 13.2 05/17/2019   Lab Results  Component Value Date   TSH 1.350 05/17/2019   Lab Results  Component Value Date   CHOL 158 09/21/2019   HDL 58 09/21/2019   LDLCALC 85 09/21/2019   TRIG 77 09/21/2019   CHOLHDL 3.3 05/17/2019   Lab Results  Component Value Date   WBC 7.2 11/29/2019   HGB 13.9 11/29/2019   HCT 42.1 11/29/2019   MCV 93 11/29/2019   PLT 226 11/29/2019   Lab Results  Component Value Date   IRON 93 09/21/2019   TIBC 299 09/21/2019   FERRITIN 51 09/21/2019   Attestation Statements:   Reviewed by clinician on day of visit: allergies, medications, problem list, medical history, surgical history, family history, social history, and previous encounter notes.  I, Insurance claims handler, CMA, am acting as transcriptionist for Helane Rima, DO  I have reviewed the above documentation for accuracy and completeness, and I agree with the above. Helane Rima, DO

## 2020-04-05 MED ORDER — VYVANSE 40 MG PO CHEW: 1.0000 | CHEWABLE_TABLET | Freq: Every day | ORAL | 0 refills | Status: DC

## 2020-04-10 ENCOUNTER — Encounter (INDEPENDENT_AMBULATORY_CARE_PROVIDER_SITE_OTHER): Payer: Self-pay | Admitting: Family Medicine

## 2020-04-13 ENCOUNTER — Encounter: Payer: Self-pay | Admitting: Family Medicine

## 2020-04-17 ENCOUNTER — Encounter: Payer: Self-pay | Admitting: Family Medicine

## 2020-04-17 DIAGNOSIS — S8265XK Nondisplaced fracture of lateral malleolus of left fibula, subsequent encounter for closed fracture with nonunion: Secondary | ICD-10-CM | POA: Diagnosis not present

## 2020-04-17 DIAGNOSIS — S92335K Nondisplaced fracture of third metatarsal bone, left foot, subsequent encounter for fracture with nonunion: Secondary | ICD-10-CM | POA: Diagnosis not present

## 2020-04-18 ENCOUNTER — Encounter: Payer: Self-pay | Admitting: Family Medicine

## 2020-04-18 ENCOUNTER — Other Ambulatory Visit: Payer: Self-pay

## 2020-04-18 ENCOUNTER — Encounter (INDEPENDENT_AMBULATORY_CARE_PROVIDER_SITE_OTHER): Payer: Self-pay | Admitting: Family Medicine

## 2020-04-18 ENCOUNTER — Ambulatory Visit (INDEPENDENT_AMBULATORY_CARE_PROVIDER_SITE_OTHER): Payer: BC Managed Care – PPO | Admitting: Family Medicine

## 2020-04-18 VITALS — BP 130/84 | HR 80 | Temp 98.0°F | Ht 66.0 in | Wt 227.0 lb

## 2020-04-18 DIAGNOSIS — Z9189 Other specified personal risk factors, not elsewhere classified: Secondary | ICD-10-CM | POA: Diagnosis not present

## 2020-04-18 DIAGNOSIS — G8929 Other chronic pain: Secondary | ICD-10-CM

## 2020-04-18 DIAGNOSIS — E559 Vitamin D deficiency, unspecified: Secondary | ICD-10-CM

## 2020-04-18 DIAGNOSIS — F5081 Binge eating disorder: Secondary | ICD-10-CM

## 2020-04-18 DIAGNOSIS — M79672 Pain in left foot: Secondary | ICD-10-CM

## 2020-04-18 DIAGNOSIS — Z6836 Body mass index (BMI) 36.0-36.9, adult: Secondary | ICD-10-CM

## 2020-04-18 MED ORDER — VITAMIN D (ERGOCALCIFEROL) 1.25 MG (50000 UNIT) PO CAPS
50000.0000 [IU] | ORAL_CAPSULE | ORAL | 0 refills | Status: DC
Start: 2020-04-18 — End: 2020-06-04

## 2020-04-19 NOTE — Progress Notes (Signed)
Chief Complaint:   OBESITY Katie Glass is here to discuss her progress with her obesity treatment plan along with follow-up of her obesity related diagnoses.   Today's visit was #: 16 Starting weight: 268 lbs Starting date: 09/21/2019 Today's weight: 227 lbs Today's date: 04/18/2020 Total lbs lost to date: 41 lbs Body mass index is 36.64 kg/m.  Total weight loss percentage to date: -15.30%  Interim History: She says that Vyvanse 40 mg daily is working well. Adjusting to carbon fiber plate in shoe. Nutrition Plan: the Category 4 Plan for 75% of the time.  Activity: Cardio for 30-45 minutes 4-5 times per week.  Assessment/Plan:   1. Vitamin D deficiency Not at goal. Current vitamin D is 25.0, tested on 09/21/2019. Optimal goal > 50 ng/dL.   Plan:  [x]   Continue Vitamin D @50 ,000 IU every week. []   Continue home supplement daily. [x]   Follow-up for routine testing of Vitamin D at least 2-3 times per year to avoid over-replacement.  -Refill Vitamin D, Ergocalciferol, (DRISDOL) 1.25 MG (50000 UNIT) CAPS capsule; Take 1 capsule (50,000 Units total) by mouth every 7 (seven) days.  Dispense: 4 capsule; Refill: 0  2. Chronic foot pain, left Improving.  Followed by Sports Medicine.  Using bone stimulator.  Discussed proper exercise.   3. Binge eating disorder/ADHD She is taking Vyvanxe 40 mg daily and says it is working well.  Plan:  Continue Vyvanse.  4. At risk for activity intolerance Due to Katie Glass's current state of health and medical condition(s), she is at a higher risk for heart disease.   This puts the patient at much greater risk to subsequently develop cardiopulmonary conditions that can significantly affect patient's quality of life in a negative manner as well.    At least 8 minutes was spent on counseling Katie Glass about these concerns today. Initial goal is to lose at least 5-10% of starting weight to help reduce these risk factors.  We will continue to reassess these  conditions on a fairly regular basis in an attempt to decrease patient's overall morbidity and mortality.  Evidence-based interventions for health behavior change were utilized today including the discussion of self monitoring techniques, problem-solving barriers and SMART goal setting techniques.  Specifically regarding patient's less desirable eating habits and patterns, we employed the technique of small changes when Katie Glass has not been able to fully commit to her prudent nutritional plan.  5. Class 2 severe obesity with serious comorbidity and body mass index (BMI) of 36.0 to 36.9 in adult, unspecified obesity type (HCC)  Course: Katie Glass is currently in the action stage of change. As such, her goal is to continue with weight loss efforts.   Nutrition goals: She has agreed to the Category 4 Plan.   Exercise goals: For substantial health benefits, adults should do at least 150 minutes (2 hours and 30 minutes) a week of moderate-intensity, or 75 minutes (1 hour and 15 minutes) a week of vigorous-intensity aerobic physical activity, or an equivalent combination of moderate- and vigorous-intensity aerobic activity. Aerobic activity should be performed in episodes of at least 10 minutes, and preferably, it should be spread throughout the week.  Behavioral modification strategies: increasing lean protein intake, decreasing simple carbohydrates, increasing vegetables and increasing water intake.  Katie Glass has agreed to follow-up with our clinic in 3 weeks. She was informed of the importance of frequent follow-up visits to maximize her success with intensive lifestyle modifications for her multiple health conditions.   Objective:   Blood pressure  130/84, pulse 80, temperature 98 F (36.7 C), temperature source Oral, height 5\' 6"  (1.676 m), weight 227 lb (103 kg), SpO2 98 %, unknown if currently breastfeeding. Body mass index is 36.64 kg/m.  General: Cooperative, alert, well developed, in no acute  distress. HEENT: Conjunctivae and lids unremarkable. Cardiovascular: Regular rhythm.  Lungs: Normal work of breathing. Neurologic: No focal deficits.   Lab Results  Component Value Date   CREATININE 0.87 09/21/2019   BUN 13 09/21/2019   NA 142 09/21/2019   K 4.2 09/21/2019   CL 102 09/21/2019   CO2 22 09/21/2019   Lab Results  Component Value Date   ALT 9 09/21/2019   AST 14 09/21/2019   ALKPHOS 45 (L) 09/21/2019   BILITOT 0.6 09/21/2019   Lab Results  Component Value Date   HGBA1C 5.2 09/21/2019   HGBA1C 5.4 05/17/2019   Lab Results  Component Value Date   INSULIN 7.6 09/21/2019   INSULIN 13.2 05/17/2019   Lab Results  Component Value Date   TSH 1.350 05/17/2019   Lab Results  Component Value Date   CHOL 158 09/21/2019   HDL 58 09/21/2019   LDLCALC 85 09/21/2019   TRIG 77 09/21/2019   CHOLHDL 3.3 05/17/2019   Lab Results  Component Value Date   WBC 7.2 11/29/2019   HGB 13.9 11/29/2019   HCT 42.1 11/29/2019   MCV 93 11/29/2019   PLT 226 11/29/2019   Lab Results  Component Value Date   IRON 93 09/21/2019   TIBC 299 09/21/2019   FERRITIN 51 09/21/2019   Attestation Statements:   Reviewed by clinician on day of visit: allergies, medications, problem list, medical history, surgical history, family history, social history, and previous encounter notes.  I, 09/23/2019, CMA, am acting as transcriptionist for Insurance claims handler, DO  I have reviewed the above documentation for accuracy and completeness, and I agree with the above. Helane Rima, DO

## 2020-04-26 ENCOUNTER — Encounter: Payer: Self-pay | Admitting: Family Medicine

## 2020-05-07 ENCOUNTER — Ambulatory Visit (INDEPENDENT_AMBULATORY_CARE_PROVIDER_SITE_OTHER): Payer: BC Managed Care – PPO | Admitting: Family Medicine

## 2020-05-09 NOTE — Progress Notes (Unsigned)
[name] [address] [place] [address] Phone: [phone] Subjective:   Katie Glass, am serving as a scribe for Dr. Antoine Primas. This visit occurred during the SARS-CoV-2 public health emergency.  Safety protocols were in place, including screening questions prior to the visit, additional usage of staff PPE, and extensive cleaning of exam room while observing appropriate contact time as indicated for disinfecting solutions.   I'm seeing this patient by the request  of:  [name], MD  CC: left foot pain and right foot pain   ESP:[id number]   03/30/2020 Confirmed on MRI.  Continue once weekly vitamin D K2.  Discussed carbon fiber plate.  Patient has had now multiple stress fractures and this has been going on now greater than 12 weeks.  We will see if we can get a bone stimulator for patient.  I think this this will be beneficial.  Follow-up with me again 4 to 6 weeks  Patient does have disruption of the capsule noted on MRI.  Patient though does not want any surgical intervention.  Has significant swelling on ultrasound and given injection today.  Optimistic that patient should do relatively well.  Discussed topical anti-inflammatories and proper shoes, patient has custom orthotics.  Follow-up with me again 4 to 6 weeks  Update 05/10/2020 Katie Glass is a 45 y.o. female coming in with complaint of stress fracture of left foot and 1st MTP arthritis on right foot. Patient states that she wore her boot when she was traveling and working out. Pain increased with a lot of walking. Has been using bone stimulator.   Also having left shoulder pain. Notices pain at night when she is lying on that side. Patient carries son with left arm. Pain in anterior and has radiating pain into left thumb. Is woken up at night with pain in thumb and into the middle finger. Feels knots in shoulder and left elbow. Has tried counterforce strap over the elbow.    Left foot MRI- 3rd metatarsal stress fracture noted      Past Medical History:  Diagnosis Date  . Arthritis    knees  . Asthma    as a child  . Back pain   . Food allergy    Tannin/sulfate in Wine and ciders, rash on face and hands  . Joint pain   . Lower extremity edema   . MMT (medial meniscus tear)    right  . Sleep apnea    CPAP nightly  . Umbilical hernia    Past Surgical History:  Procedure Laterality Date  . BILATERAL SALPINGECTOMY Bilateral 04/15/2018   Procedure: BILATERAL SALPINGECTOMY;  Surgeon: Carrington Clamp, MD;  Location: [facility] BIRTHING SUIT[facility];  Service: Obstetrics;  Laterality: Bilateral;  . CESAREAN SECTION  11/12/2010   Procedure: CESAREAN SECTION;  Surgeon: Almon Hercules;  Location: [facility];  Service: Gynecology;  Laterality: N/A;  . CESAREAN SECTION N/A 04/15/2018   Procedure: CESAREAN SECTION;  Surgeon: Carrington Clamp, MD;  Location: [facility]THING SUIT[facility];  Service: Obstetrics;  Laterality: N/A;  . KNEE ARTHROSCOPY Right 02/11/2017   Procedure: RIGHT KNEE ARTHROSCOPY WITH DEBRIDEMENT CHONDROMALACIA;  Surgeon: Gean Birchwood, MD;  Location: [facility];  Service: Orthopedics;  Laterality: Right;   Social History   Socioeconomic History  . Marital status: Married    Spouse name: Lanya Bucks  . Number of children: Not on file  . Years of education: Not on file  . Highest education level: Not on file  Occupational History  . Occupation: Pastor  Tobacco Use  . Smoking status: Never Smoker  . Smokeless tobacco: Never Used  Vaping Use  . Vaping Use: Never used  Substance and Sexual Activity  . Alcohol use: Yes    Comment: social  . Drug use: No  . Sexual activity: Yes    Birth control/protection: None  Other Topics Concern  . Not on file  Social History Narrative  . Not on file   Social Determinants of Health   Financial Resource Strain: Not on file  Food Insecurity: Not on file  Transportation Needs: Not on file  Physical  Activity: Not on file  Stress: Not on file  Social Connections: Not on file   No Known Allergies Family History  Problem Relation Age of Onset  . Hypertension Father   . Hyperlipidemia Father   . Heart disease Father   . Cancer Maternal Grandfather   . Heart disease Paternal Grandfather   . Pulmonary embolism Mother   . Alcohol abuse Mother     Current Outpatient Medications (Endocrine & Metabolic):  .  levonorgestrel (MIRENA) 20 MCG/24HR IUD, 1 each by Intrauterine route once.   Current Outpatient Medications (Respiratory):  .  cetirizine (ZYRTEC) 10 MG tablet, Take 10 mg by mouth daily. .  fluticasone (FLONASE) 50 MCG/ACT nasal spray, Place 1 spray into both nostrils daily.  Current Outpatient Medications (Analgesics):  .  acetaminophen (TYLENOL) 500 MG tablet, Take 500 mg by mouth every 6 (six) hours as needed. Marland Kitchen  ibuprofen (ADVIL) 200 MG tablet, Take 200 mg by mouth every 6 (six) hours as needed.  Current Outpatient Medications (Hematological):  .  tranexamic acid (LYSTEDA) 650 MG TABS tablet, Take 1,300 mg by mouth. Take one tablet by mouth three times a day during menses  Current Outpatient Medications (Other):  Marland Kitchen  Lisdexamfetamine Dimesylate (VYVANSE) 40 MG CHEW, Chew 1 each by mouth daily. .  Multiple Vitamins-Minerals (WOMENS MULTI VITAMIN & MINERAL PO), Take 1 tablet by mouth daily. Marland Kitchen  triamcinolone cream (KENALOG) 0.1 %, Apply 1 application topically 2 (two) times daily as needed. .  Vitamin D, Ergocalciferol, (DRISDOL) 1.25 MG (50000 UNIT) CAPS capsule, Take 1 capsule (50,000 Units total) by mouth every 7 (seven) days. .  vitamin k 100 MCG tablet, Take 200 mcg by mouth daily.   Reviewed prior external information including notes and imaging from  primary care provider As well as notes that were available from care everywhere and other healthcare systems.  Past medical history, social, surgical and family history all reviewed in electronic medical record.  No  pertanent information unless stated regarding to the chief complaint.   Review of Systems:  No headache, visual changes, nausea, vomiting, diarrhea, constipation, dizziness, abdominal pain, skin rash, fevers, chills, night sweats, weight loss, swollen lymph nodes, body aches, joint swelling, chest pain, shortness of breath, mood changes.   Objective  Blood pressure 124/82, pulse 92, height 5\' 6"  (1.676 m), weight 238 lb (108 kg), SpO2 98 %, unknown if currently breastfeeding.   General: No apparent distress alert and oriented x3 mood and affect normal, dressed appropriately.  HEENT: Pupils equal, extraocular movements intact  Respiratory: Patient's speak in full sentences and does not appear short of breath  Cardiovascular: No lower extremity edema, non tender, no erythema  Gait normal with good balance and coordination.  MSK:   Bilateral foot exam shows the patient does have a breakdown the transverse arch.  Patient does have a rigid midfoot.  Patient  is significantly less tender on both feet though than previous exam. Left shoulder has full range of motion.  No significant impingement noted.  Mild positive Tinel's at the wrist.  Good grip strength noted.  Neck exam with some mild loss of lordosis.  Limited musculoskeletal ultrasound was performed and interpreted by Judi Saa  Limited ultrasound of patient's first metatarsal on the right foot shows significant decrease in hypoechoic changes from previous exam.  Patient also on the left foot shows that there is a good callus formation noted. Impression: Interval healing noted   Impression and Recommendations:     The above documentation has been reviewed and is accurate and complete Judi Saa, DO

## 2020-05-10 ENCOUNTER — Ambulatory Visit: Payer: BC Managed Care – PPO | Admitting: Family Medicine

## 2020-05-10 ENCOUNTER — Ambulatory Visit (INDEPENDENT_AMBULATORY_CARE_PROVIDER_SITE_OTHER): Payer: BC Managed Care – PPO

## 2020-05-10 ENCOUNTER — Encounter: Payer: Self-pay | Admitting: Family Medicine

## 2020-05-10 ENCOUNTER — Other Ambulatory Visit: Payer: Self-pay

## 2020-05-10 ENCOUNTER — Ambulatory Visit: Payer: Self-pay

## 2020-05-10 VITALS — BP 124/82 | HR 92 | Ht 66.0 in | Wt 238.0 lb

## 2020-05-10 DIAGNOSIS — M19071 Primary osteoarthritis, right ankle and foot: Secondary | ICD-10-CM | POA: Diagnosis not present

## 2020-05-10 DIAGNOSIS — M542 Cervicalgia: Secondary | ICD-10-CM

## 2020-05-10 DIAGNOSIS — M79602 Pain in left arm: Secondary | ICD-10-CM | POA: Diagnosis not present

## 2020-05-10 DIAGNOSIS — M25512 Pain in left shoulder: Secondary | ICD-10-CM

## 2020-05-10 DIAGNOSIS — M84375G Stress fracture, left foot, subsequent encounter for fracture with delayed healing: Secondary | ICD-10-CM

## 2020-05-10 IMAGING — DX DG SHOULDER 2+V*L*
3 series · 3 of 3 positions shown · non-contrast
Comparison: None.

CLINICAL DATA: Left shoulder pain

EXAM:
LEFT SHOULDER - 2+ VIEW

[shoulder ap (1 of 2)]
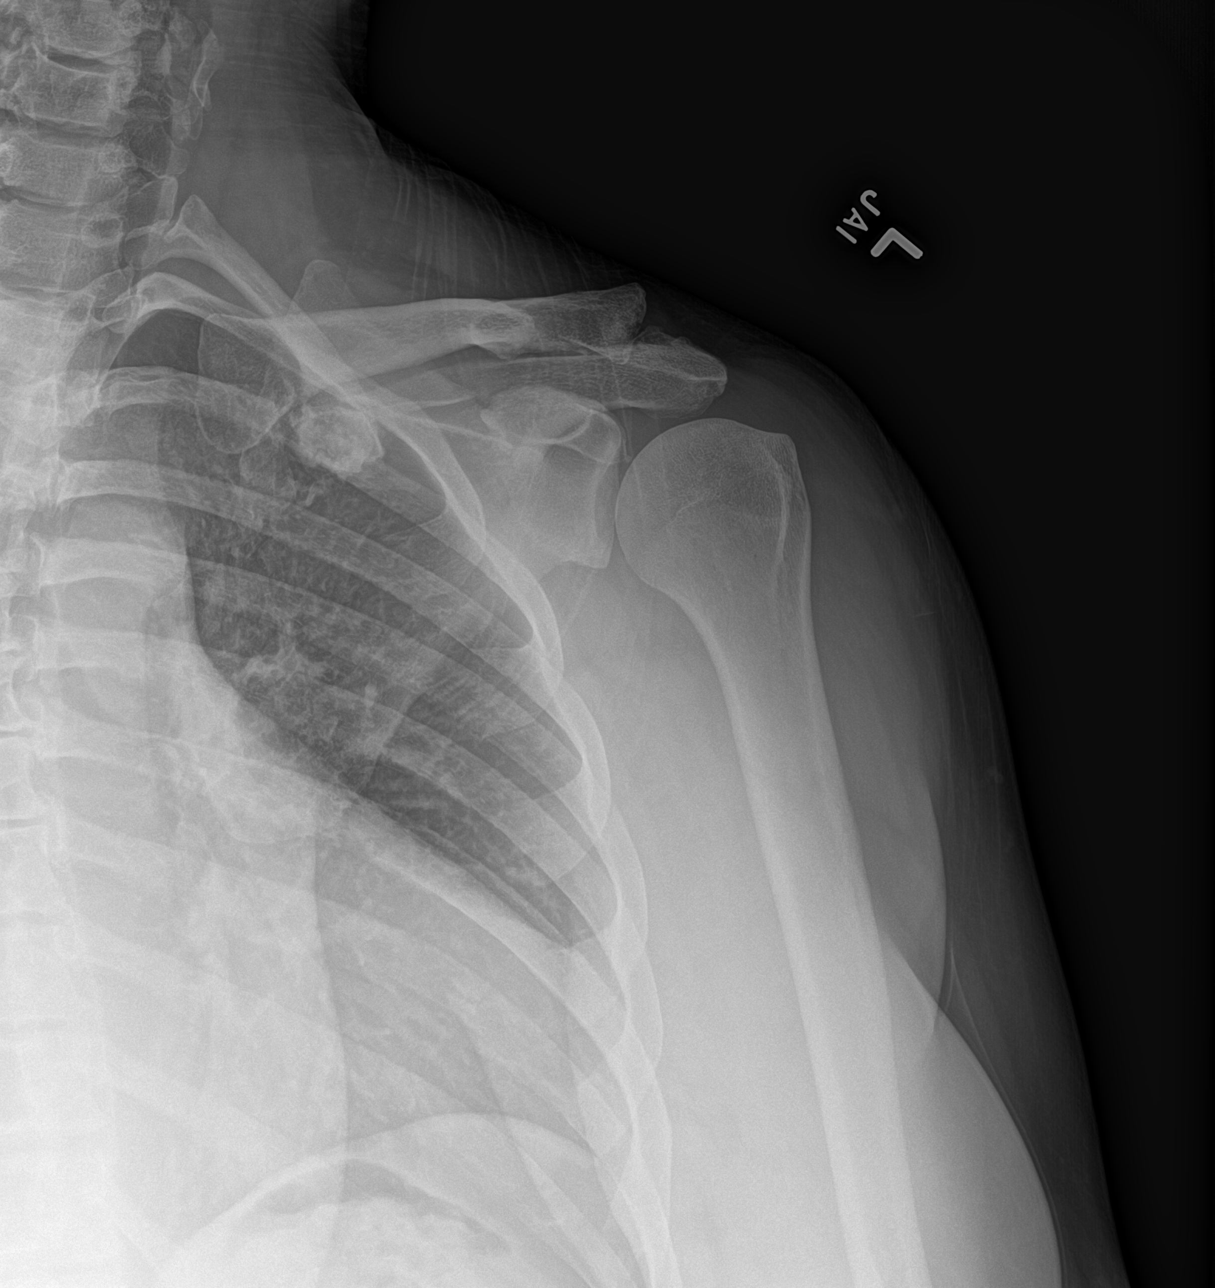

[shoulder ap (2 of 2)]
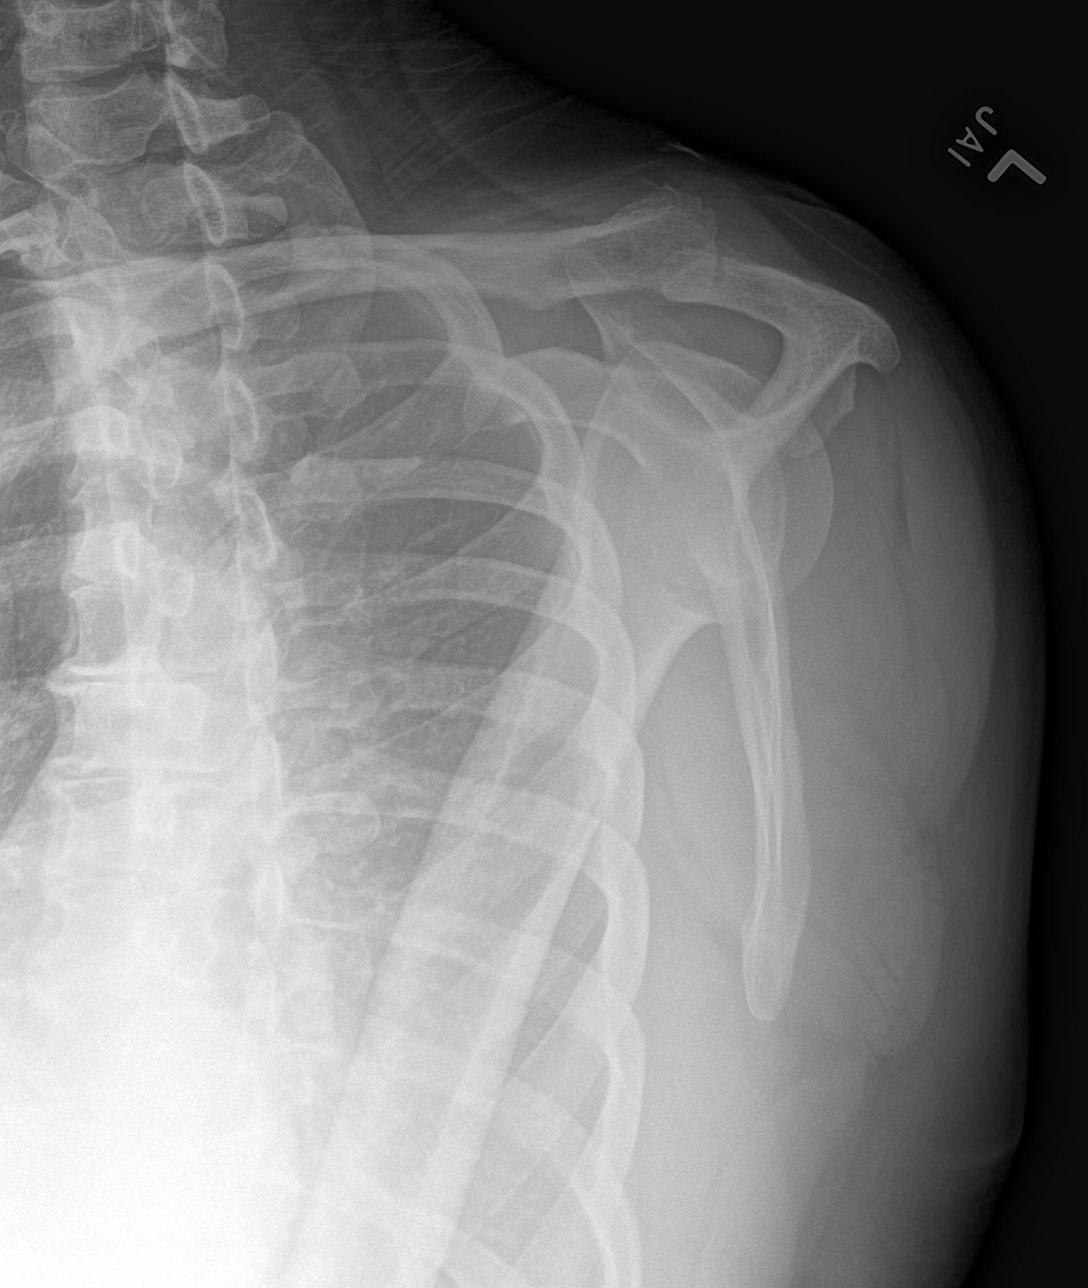

[shoulder axial]
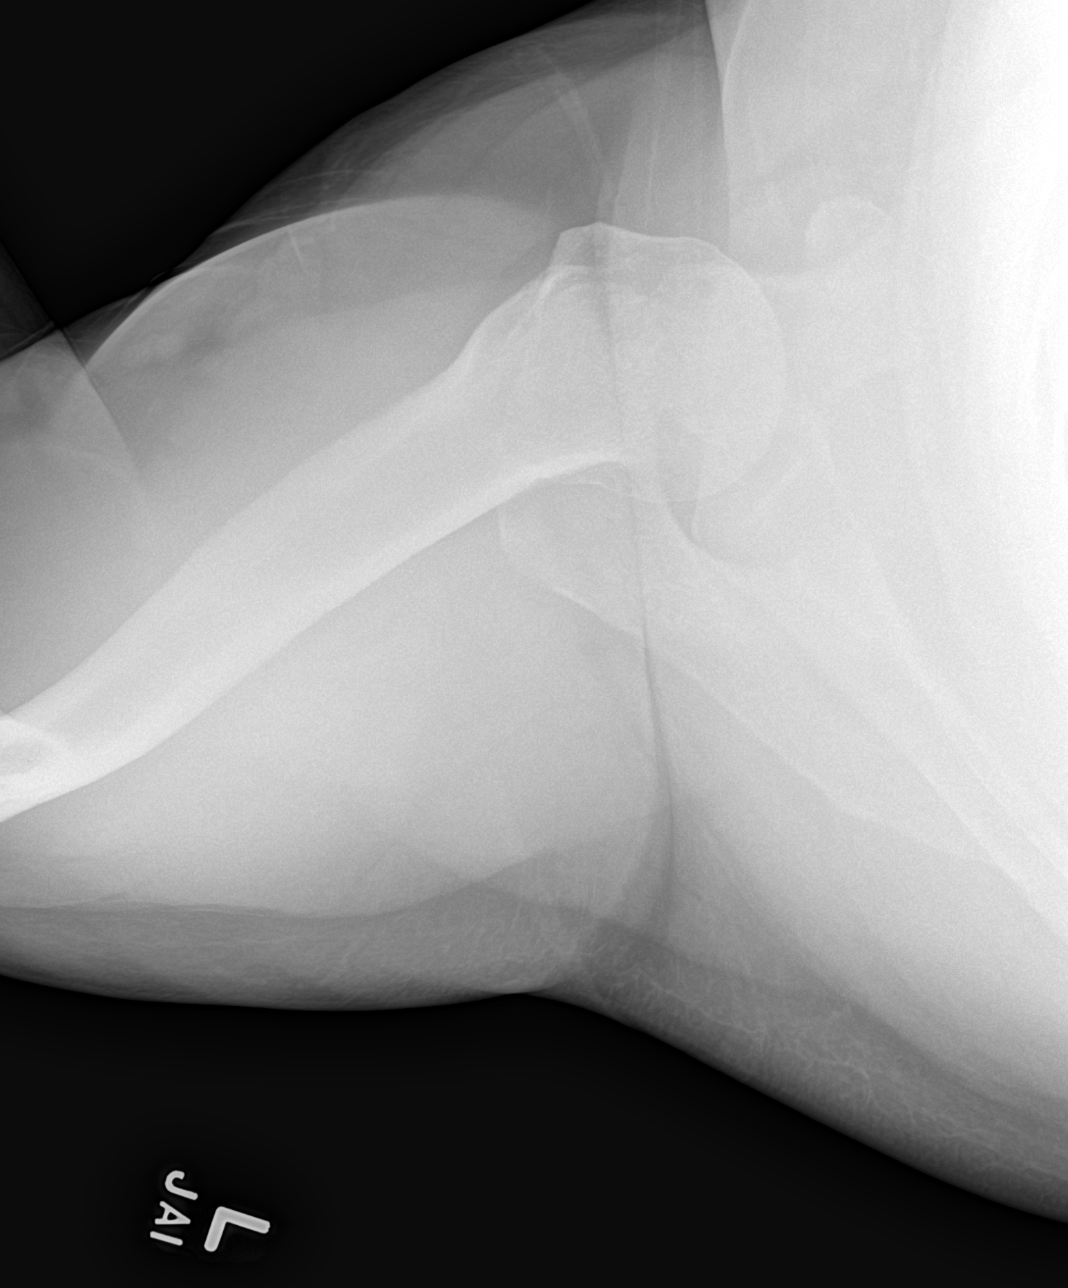

[3 of 3 positions shown; findings below may reference images not displayed]

FINDINGS: Frontal, transscapular, and axillary views of the left shoulder are
obtained. No fracture, subluxation, or dislocation. Moderate
hypertrophic changes of the acromioclavicular joint. Left chest is
clear.
IMPRESSION: 1. Moderate hypertrophic changes of the acromioclavicular joint.
2. No acute bony abnormality.

## 2020-05-10 IMAGING — DX DG CERVICAL SPINE COMPLETE 4+V
5 series · 5 of 5 positions shown · non-contrast
Comparison: None.

CLINICAL DATA: 44-year-old female with neck pain.

EXAM:
CERVICAL SPINE - COMPLETE 4+ VIEW

[c-spine lat]
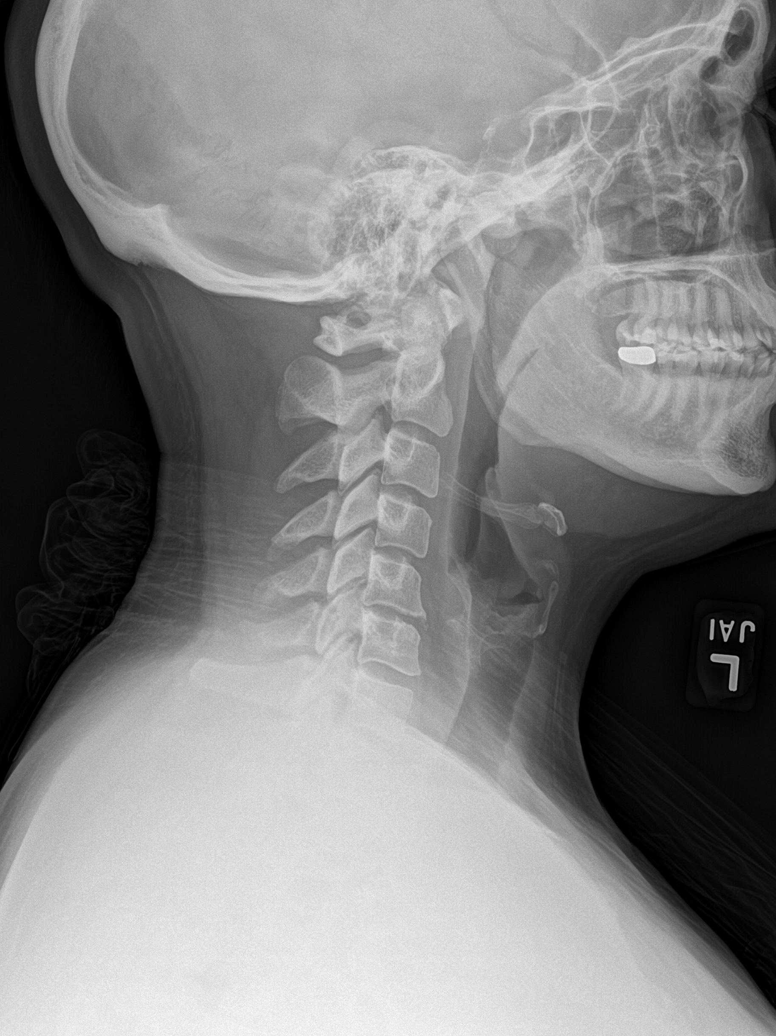

[c-spine obl (1 of 2)]
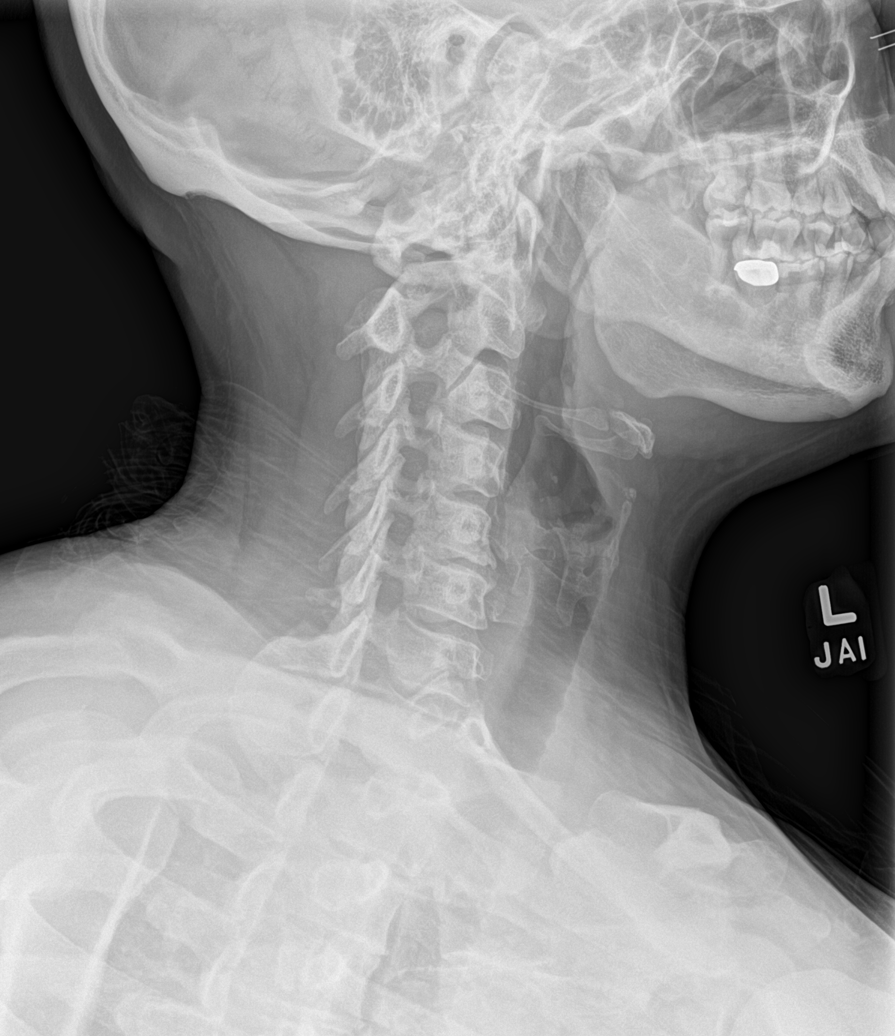

[c-spine obl (2 of 2)]
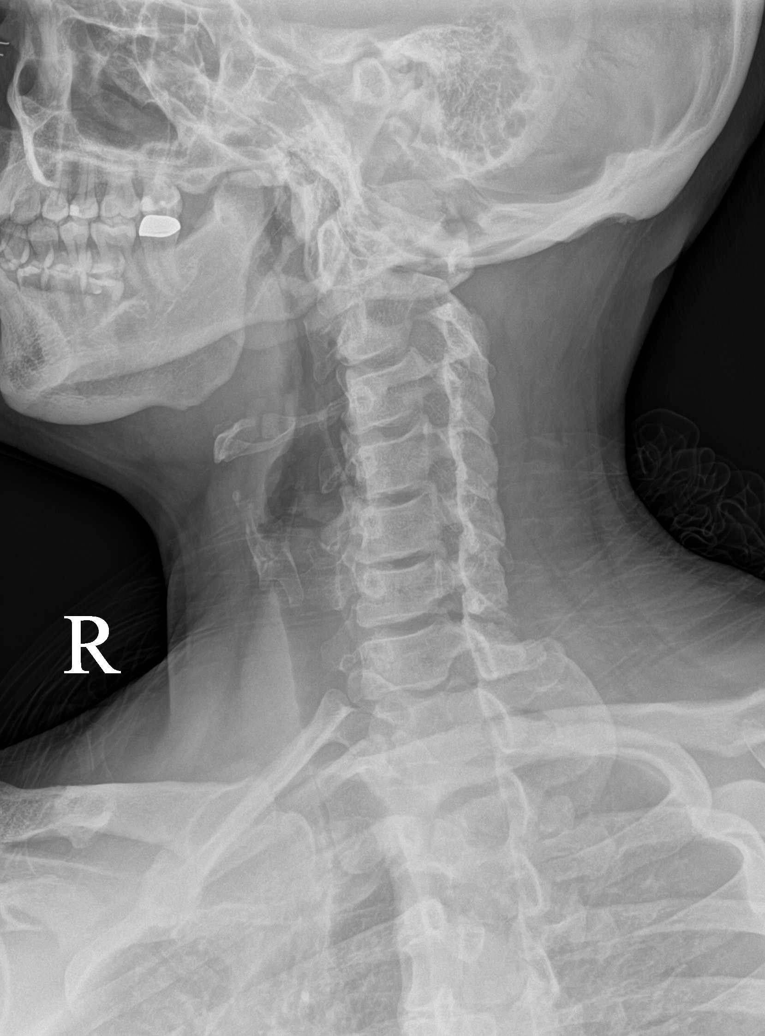

[c-spine ap]
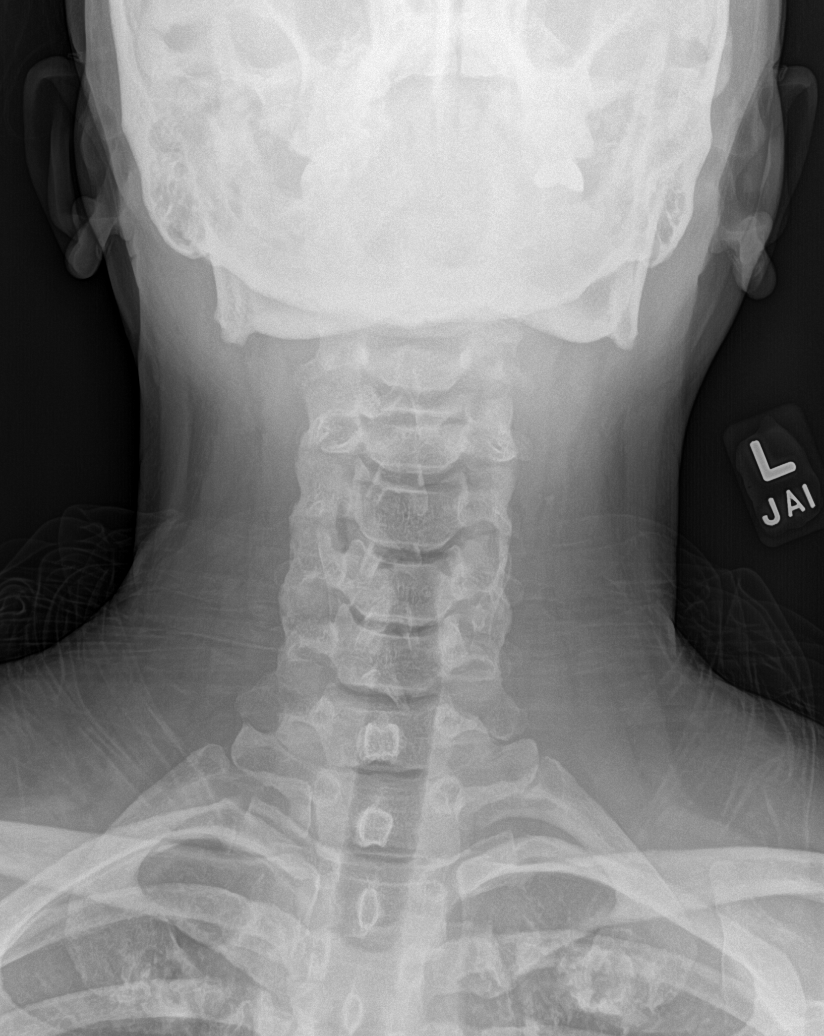

[c-spine open mouth]
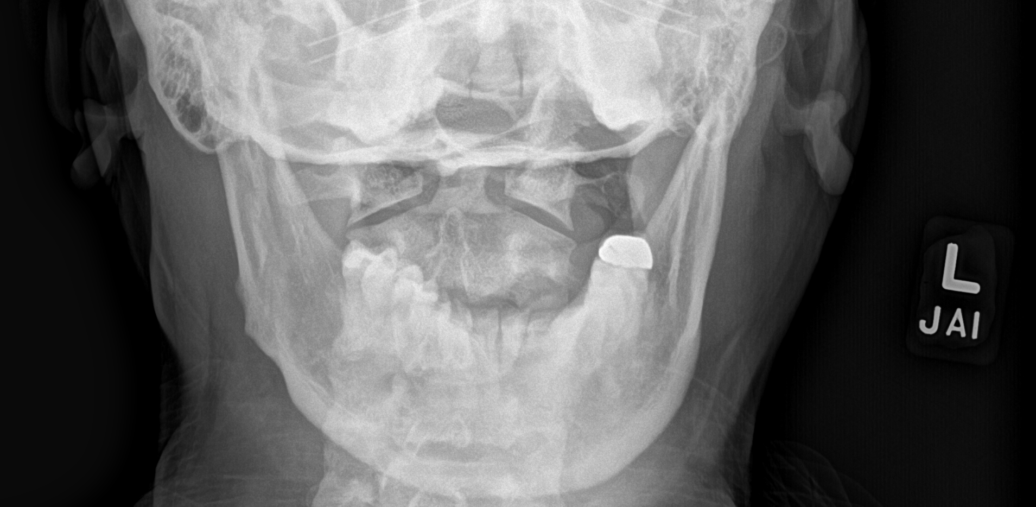

[5 of 5 positions shown; findings below may reference images not displayed]

FINDINGS: There is no acute fracture or subluxation of the cervical spine.
There is straightening of normal cervical lordosis which may be
positional or due to muscle spasm. The visualized posterior elements
and odontoid appear intact. There is anatomic alignment of the
lateral masses of C1 and C2. The soft tissues are unremarkable.
IMPRESSION: No acute/traumatic cervical spine pathology.

## 2020-05-10 NOTE — Assessment & Plan Note (Signed)
Significant improvement with patient in the bone stimulator at this time.  Patient is making progress.  We will transition back into her custom orthotics in 2-week and can start to actually walk more regularly.  Patient is doing much better with the arthritis in the first foot.  Discussed with patient to increase activity slowly.  Continue the once weekly vitamin D.  Follow-up 1 more time in 5 to 6 weeks.

## 2020-05-10 NOTE — Patient Instructions (Addendum)
Xray on way out Can switch to orthotic in March Wrist splint Carpal tunnel exercises Scapula exercises Follow up in 5-6 weeks

## 2020-05-10 NOTE — Assessment & Plan Note (Signed)
I am more concerned that patient's left arm pain is secondary to more of a carpal tunnel syndrome.  Patient has been wearing 2 different activity trackers on the left wrist and would encourage her to not to do it at this time.  Patient can put him on different extremities and see if this would be helpful.  We discussed with patient about the possibility of cervical radiculopathy and we will get x-rays.  I am more concerned for carpal tunnel and patient given exercises and a wrist brace at this time.  Encouraged her to consider the possibly restarting gabapentin at night.  Follow-up with me again in 5 to 6 weeks

## 2020-05-10 NOTE — Assessment & Plan Note (Signed)
Significant improvement after injection.  Should do relatively well.

## 2020-06-04 ENCOUNTER — Ambulatory Visit (INDEPENDENT_AMBULATORY_CARE_PROVIDER_SITE_OTHER): Payer: BC Managed Care – PPO | Admitting: Family Medicine

## 2020-06-04 ENCOUNTER — Encounter (INDEPENDENT_AMBULATORY_CARE_PROVIDER_SITE_OTHER): Payer: Self-pay | Admitting: Family Medicine

## 2020-06-04 ENCOUNTER — Other Ambulatory Visit: Payer: Self-pay

## 2020-06-04 VITALS — BP 143/68 | HR 78 | Temp 98.1°F | Ht 66.0 in | Wt 236.0 lb

## 2020-06-04 DIAGNOSIS — E559 Vitamin D deficiency, unspecified: Secondary | ICD-10-CM

## 2020-06-04 DIAGNOSIS — Z975 Presence of (intrauterine) contraceptive device: Secondary | ICD-10-CM | POA: Diagnosis not present

## 2020-06-04 DIAGNOSIS — F411 Generalized anxiety disorder: Secondary | ICD-10-CM

## 2020-06-04 DIAGNOSIS — F5081 Binge eating disorder: Secondary | ICD-10-CM | POA: Diagnosis not present

## 2020-06-04 DIAGNOSIS — Z6838 Body mass index (BMI) 38.0-38.9, adult: Secondary | ICD-10-CM

## 2020-06-05 MED ORDER — VYVANSE 40 MG PO CHEW: 1.0000 | CHEWABLE_TABLET | Freq: Every day | ORAL | 0 refills | Status: DC

## 2020-06-05 MED ORDER — VITAMIN D (ERGOCALCIFEROL) 1.25 MG (50000 UNIT) PO CAPS: 50000.0000 [IU] | ORAL_CAPSULE | ORAL | 0 refills | Status: DC

## 2020-06-07 NOTE — Progress Notes (Signed)
Chief Complaint:   OBESITY Katie Glass is here to discuss her progress with her obesity treatment plan along with follow-up of her obesity related diagnoses.   Today's visit was #: 17 Starting weight: 268 lbs Starting date: 09/21/2019 Today's weight: 236 lbs Today's date: 06/04/2020 Total lbs lost to date: 32 lbs Body mass index is 38.09 kg/m.  Total weight loss percentage to date: -11.94%  Interim History:  Katie Glass recently restarted tracking food intake using MFP.  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 She continues exercising regularly. Biggest challenge is overeating at meals.  Current Meal Plan: the Category 4 Plan for 50% of the time.  Current Exercise Plan: Strengthening/cardio/walking for 45 minutes 5-6 times per week.  Assessment/Plan:   1. Vitamin D deficiency Not at goal. Current vitamin D is 25.0. Optimal goal > 50 ng/dL.  She is taking vitamin D 50,000 IU weekly. Recheck at next visit.  Plan: Continue to take prescription Vitamin D @50 ,000 IU every week as prescribed.  Follow-up for routine testing of Vitamin D, at least 2-3 times per year to avoid over-replacement.  - Refill Vitamin D, Ergocalciferol, (DRISDOL) 1.25 MG (50000 UNIT) CAPS capsule; Take 1 capsule (50,000 Units total) by mouth every 7 (seven) days.  Dispense: 4 capsule; Refill: 0  2. IUD (intrauterine device) in place Katie Glass has a Mirena IUD in place. She understands that her current medications are not safe for pregnancy.  3. Binge eating disorder Katie Glass is taking Vyvanse 40 mg daily for BED. This has been helpful for controlling impulsivity, focus, and bingeing. Plan:  Will refill Vyvanse 40 mg daily, as per below.  People who binge eat feel as if they don't have control over how much they eat and have feelings of guilt or self-loathing after a binge eating episode. [employer] estimates that about 30 percent of adults with binge eating  disorder also have a history of ADHD. The FDA has approved Vyvanse as a treatment option for both ADHD and binge eating. Vyvanse targets the brain's reward center by increasing the levels of dopamine and norepinephrine, the chemicals of the brain responsible for feelings of pleasure. Mindful eating is the recommended nutritional approach to treating BED.   I have consulted the [place] Controlled Substances Registry for this patient, and feel the risk/benefit ratio today is favorable for proceeding with this prescription for a controlled substance. The patient understands monitoring parameters and red flags.   - Refill Lisdexamfetamine Dimesylate (VYVANSE) 40 MG CHEW; Chew 1 each by mouth daily.  Dispense: 30 tablet; Refill: 0  4. GAD (generalized anxiety disorder) Worsened.  She is not taking an SSRI. This should be considered if not improving.  Plan:  Behavior modification techniques were discussed today to help Katie Glass deal with her anxiety.    5. Class 2 severe obesity with serious comorbidity and body mass index (BMI) of 38.0 to 38.9 in adult, unspecified obesity type (HCC)  Course: Katie Glass is currently in the action stage of change. As such, her goal is to continue with weight loss efforts.   Nutrition goals: She has agreed to keeping a food journal and adhering to recommended goals of 1800-2000 calories and 125 grams of protein.   Exercise goals: For substantial health benefits, adults should do at least 150 minutes (2 hours and 30 minutes) a week of moderate-intensity, or 75 minutes (1 hour and 15 minutes) a week of vigorous-intensity aerobic physical activity, or  an equivalent combination of moderate- and vigorous-intensity aerobic activity. Aerobic activity should be performed in episodes of at least 10 minutes, and preferably, it should be spread throughout the week.  Behavioral modification strategies: increasing lean protein intake, decreasing simple carbohydrates, increasing vegetables,  increasing water intake, emotional eating strategies, dealing with family or coworker sabotage and keeping a strict food journal.  Katie Glass has agreed to follow-up with our clinic in 3-4 weeks. She was informed of the importance of frequent follow-up visits to maximize her success with intensive lifestyle modifications for her multiple health conditions.   Objective:   Blood pressure (!) 143/68, pulse 78, temperature 98.1 F (36.7 C), temperature source Oral, height 5\' 6"  (1.676 m), weight 236 lb (107 kg), SpO2 99 %, unknown if currently breastfeeding. Body mass index is 38.09 kg/m.  General: Cooperative, alert, well developed, in no acute distress. HEENT: Conjunctivae and lids unremarkable. Cardiovascular: Regular rhythm.  Lungs: Normal work of breathing. Neurologic: No focal deficits.   Lab Results  Component Value Date   CREATININE 0.87 09/21/2019   BUN 13 09/21/2019   NA 142 09/21/2019   K 4.2 09/21/2019   CL 102 09/21/2019   CO2 22 09/21/2019   Lab Results  Component Value Date   ALT 9 09/21/2019   AST 14 09/21/2019   ALKPHOS 45 (L) 09/21/2019   BILITOT 0.6 09/21/2019   Lab Results  Component Value Date   HGBA1C 5.2 09/21/2019   HGBA1C 5.4 05/17/2019   Lab Results  Component Value Date   INSULIN 7.6 09/21/2019   INSULIN 13.2 05/17/2019   Lab Results  Component Value Date   TSH 1.350 05/17/2019   Lab Results  Component Value Date   CHOL 158 09/21/2019   HDL 58 09/21/2019   LDLCALC 85 09/21/2019   TRIG 77 09/21/2019   CHOLHDL 3.3 05/17/2019   Lab Results  Component Value Date   WBC 7.2 11/29/2019   HGB 13.9 11/29/2019   HCT 42.1 11/29/2019   MCV 93 11/29/2019   PLT 226 11/29/2019   Lab Results  Component Value Date   IRON 93 09/21/2019   TIBC 299 09/21/2019   FERRITIN 51 09/21/2019   Attestation Statements:   Reviewed by clinician on day of visit: allergies, medications, problem list, medical history, surgical history, family history, social  history, and previous encounter notes.  Time spent on visit including pre-visit chart review and post-visit care and charting was 48 minutes.   I, 09/23/2019, CMA, am acting as transcriptionist for Insurance claims handler, DO  I have reviewed the above documentation for accuracy and completeness, and I agree with the above. Helane Rima, DO

## 2020-06-08 DIAGNOSIS — H6501 Acute serous otitis media, right ear: Secondary | ICD-10-CM | POA: Diagnosis not present

## 2020-06-12 NOTE — Progress Notes (Signed)
[name] [address] [place] [address] Phone: [phone] Subjective:   Katie Glass, am serving as a scribe for Dr. Antoine Primas. This visit occurred during the SARS-CoV-2 public health emergency.  Safety protocols were in place, including screening questions prior to the visit, additional usage of staff PPE, and extensive cleaning of exam room while observing appropriate contact time as indicated for disinfecting solutions.   I'm seeing this patient by the request  of:  [name], MD  CC: Left foot pain follow-up  YKZ:[id number]   05/10/2020 I am more concerned that patient's left arm pain is secondary to more of a carpal tunnel syndrome.  Patient has been wearing 2 different activity trackers on the left wrist and would encourage her to not to do it at this time.  Patient can put him on different extremities and see if this would be helpful.  We discussed with patient about the possibility of cervical radiculopathy and we will get x-rays.  I am more concerned for carpal tunnel and patient given exercises and a wrist brace at this time.  Encouraged her to consider the possibly restarting gabapentin at night.  Follow-up with me again in 5 to 6 weeks  Significant improvement with patient in the bone stimulator at this time.  Patient is making progress.  We will transition back into her custom orthotics in 2-week and can start to actually walk more regularly.  Patient is doing much better with the arthritis in the first foot.  Discussed with patient to increase activity slowly.  Continue the once weekly vitamin D.  Follow-up 1 more time in 5 to 6 weeks.  Update 06/13/2020 Katie Glass is a 45 y.o. female coming in with complaint of left foot and left arm pain. Patient states that her shoulder and wrist pain are improving. Sleeps with wrist brace at night.   Using bone stimulator for both feet each evening. Has been working out with  modifications.    Xray L shoulder 05/10/2020 IMPRESSION: 1. Moderate hypertrophic changes of the acromioclavicular joint. 2. No acute bony abnormality.   Xray Cervical spine 05/10/2020 IMPRESSION: No acute/traumatic cervical spine pathology.     Past Medical History:  Diagnosis Date  . Arthritis    knees  . Asthma    as a child  . Back pain   . Food allergy    Tannin/sulfate in [employer] and [employer], rash on face and hands  . Joint pain   . Lower extremity edema   . MMT (medial meniscus tear)    right  . Sleep apnea    CPAP nightly  . Umbilical hernia    Past Surgical History:  Procedure Laterality Date  . BILATERAL SALPINGECTOMY Bilateral 04/15/2018   Procedure: BILATERAL SALPINGECTOMY;  Surgeon: Carrington Clamp, MD;  Location: [facility] BIRTHING SUIT[facility];  Service: Obstetrics;  Laterality: Bilateral;  . CESAREAN SECTION  11/12/2010   Procedure: CESAREAN SECTION;  Surgeon: Almon Hercules;  Location: [facility];  Service: Gynecology;  Laterality: N/A;  . CESAREAN SECTION N/A 04/15/2018   Procedure: CESAREAN SECTION;  Surgeon: Carrington Clamp, MD;  Location: [facility]IRTHING SU[facility];  Service: Obstetrics;  Laterality: N/A;  . KNEE ARTHROSCOPY Right 02/11/2017   Procedure: RIGHT KNEE ARTHROSCOPY WITH DEBRIDEMENT CHONDROMALACIA;  Surgeon: Gean Birchwood, MD;  Location: [facility];  Service: Orthopedics;  Laterality: Right;   Social History   Socioeconomic History  . Marital status: Married    Spouse name: Virdie Penning  .  Number of children: Not on file  . Years of education: Not on file  . Highest education level: Not on file  Occupational History  . Occupation: Pastor  Tobacco Use  . Smoking status: Never Smoker  . Smokeless tobacco: Never Used  Vaping Use  . Vaping Use: Never used  Substance and Sexual Activity  . Alcohol use: Yes    Comment: social  . Drug use: No  . Sexual activity: Yes    Birth control/protection: None  Other Topics Concern  . Not on file   Social History Narrative  . Not on file   Social Determinants of Health   Financial Resource Strain: Not on file  Food Insecurity: Not on file  Transportation Needs: Not on file  Physical Activity: Not on file  Stress: Not on file  Social Connections: Not on file   No Known Allergies Family History  Problem Relation Age of Onset  . Hypertension Father   . Hyperlipidemia Father   . Heart disease Father   . Cancer Maternal Grandfather   . Heart disease Paternal Grandfather   . Pulmonary embolism Mother   . Alcohol abuse Mother     Current Outpatient Medications (Endocrine & Metabolic):  .  levonorgestrel (MIRENA) 20 MCG/24HR IUD, 1 each by Intrauterine route once.   Current Outpatient Medications (Respiratory):  .  cetirizine (ZYRTEC) 10 MG tablet, Take 10 mg by mouth daily. .  fluticasone (FLONASE) 50 MCG/ACT nasal spray, Place 1 spray into both nostrils daily.  Current Outpatient Medications (Analgesics):  .  acetaminophen (TYLENOL) 500 MG tablet, Take 500 mg by mouth every 6 (six) hours as needed. Marland Kitchen  ibuprofen (ADVIL) 200 MG tablet, Take 200 mg by mouth every 6 (six) hours as needed.  Current Outpatient Medications (Hematological):  .  tranexamic acid (LYSTEDA) 650 MG TABS tablet, Take 1,300 mg by mouth. Take one tablet by mouth three times a day during menses  Current Outpatient Medications (Other):  Marland Kitchen  Lisdexamfetamine Dimesylate (VYVANSE) 40 MG CHEW, Chew 1 each by mouth daily. .  Multiple Vitamins-Minerals (WOMENS MULTI VITAMIN & MINERAL PO), Take 1 tablet by mouth daily. Marland Kitchen  triamcinolone cream (KENALOG) 0.1 %, Apply 1 application topically 2 (two) times daily as needed. .  Vitamin D, Ergocalciferol, (DRISDOL) 1.25 MG (50000 UNIT) CAPS capsule, Take 1 capsule (50,000 Units total) by mouth every 7 (seven) days. .  vitamin k 100 MCG tablet, Take 200 mcg by mouth daily.   Reviewed prior external information including notes and imaging from  primary care  provider As well as notes that were available from care everywhere and other healthcare systems.  Past medical history, social, surgical and family history all reviewed in electronic medical record.  No pertanent information unless stated regarding to the chief complaint.   Review of Systems:  No headache, visual changes, nausea, vomiting, diarrhea, constipation, dizziness, abdominal pain, skin rash, fevers, chills, night sweats, weight loss, swollen lymph nodes, body aches, joint swelling, chest pain, shortness of breath, mood changes.   Objective  Blood pressure 116/84, pulse 94, height 5\' 6"  (1.676 m), SpO2 97 %, unknown if currently breastfeeding.   General: No apparent distress alert and oriented x3 mood and affect normal, dressed appropriately.  HEENT: Pupils equal, extraocular movements intact  Respiratory: Patient's speak in full sentences and does not appear short of breath  Cardiovascular: No lower extremity edema, non tender, no erythema  Gait normal with good balance and coordination.  MSK: Foot exam does show the patient  does have some rigid midfoot.  Still has some very mild hallux limitus noted bilaterally.  Improvement though in range of motion.  Minimal discomfort still with squeeze test and mild pain over the fourth metatarsal.  Limited musculoskeletal ultrasound was performed and interpreted by Judi Saa  Limited ultrasound of patient's first metatarsals bilaterally show trace effusion.  Patient does not though have any true cortical irregularity there.  Where patient did have stress fracture over the fourth left metatarsal patient does have good callus formation going over the top at this moment.  No significant increase in Doppler flow or hypoechoic changes. Impression: Arthritic changes of the first metatarsals but interval healing of stress fracture.    Impression and Recommendations:     The above documentation has been reviewed and is accurate and complete  Judi Saa, DO

## 2020-06-13 ENCOUNTER — Other Ambulatory Visit: Payer: Self-pay

## 2020-06-13 ENCOUNTER — Ambulatory Visit: Payer: Self-pay

## 2020-06-13 ENCOUNTER — Encounter: Payer: Self-pay | Admitting: Family Medicine

## 2020-06-13 ENCOUNTER — Ambulatory Visit: Payer: BC Managed Care – PPO | Admitting: Family Medicine

## 2020-06-13 VITALS — BP 116/84 | HR 94 | Ht 66.0 in

## 2020-06-13 DIAGNOSIS — M79602 Pain in left arm: Secondary | ICD-10-CM

## 2020-06-13 DIAGNOSIS — M19071 Primary osteoarthritis, right ankle and foot: Secondary | ICD-10-CM

## 2020-06-13 DIAGNOSIS — M84375G Stress fracture, left foot, subsequent encounter for fracture with delayed healing: Secondary | ICD-10-CM

## 2020-06-13 NOTE — Assessment & Plan Note (Signed)
On ultrasound today patient does have a good callus going over 95% of the fracture.  Encourage patient continue the bone stimulator but patient can stop being as concerned with the abbreviated workout schedule.  Patient will increase activity slowly.  Discussed icing regimen and home exercises.  Follow-up with me again as needed

## 2020-06-13 NOTE — Assessment & Plan Note (Signed)
Known arthritis but doing significantly better after the injection.  Does still have trace of fluid in metatarsals.  We discussed if worsening may need to consider injections again.  Could be a candidate for PRP.  Follow-up with me as needed

## 2020-06-14 DIAGNOSIS — Z6839 Body mass index (BMI) 39.0-39.9, adult: Secondary | ICD-10-CM | POA: Diagnosis not present

## 2020-06-14 DIAGNOSIS — Z01419 Encounter for gynecological examination (general) (routine) without abnormal findings: Secondary | ICD-10-CM | POA: Diagnosis not present

## 2020-06-14 DIAGNOSIS — Z124 Encounter for screening for malignant neoplasm of cervix: Secondary | ICD-10-CM | POA: Diagnosis not present

## 2020-06-28 ENCOUNTER — Encounter (INDEPENDENT_AMBULATORY_CARE_PROVIDER_SITE_OTHER): Payer: Self-pay | Admitting: Family Medicine

## 2020-06-28 ENCOUNTER — Ambulatory Visit (INDEPENDENT_AMBULATORY_CARE_PROVIDER_SITE_OTHER): Payer: BC Managed Care – PPO | Admitting: Family Medicine

## 2020-06-28 ENCOUNTER — Other Ambulatory Visit: Payer: Self-pay

## 2020-06-28 VITALS — BP 135/82 | HR 66 | Temp 98.3°F | Ht 66.0 in | Wt 235.0 lb

## 2020-06-28 DIAGNOSIS — E8881 Metabolic syndrome: Secondary | ICD-10-CM

## 2020-06-28 DIAGNOSIS — F5081 Binge eating disorder: Secondary | ICD-10-CM | POA: Diagnosis not present

## 2020-06-28 DIAGNOSIS — Z6841 Body Mass Index (BMI) 40.0 and over, adult: Secondary | ICD-10-CM

## 2020-06-28 DIAGNOSIS — R632 Polyphagia: Secondary | ICD-10-CM | POA: Diagnosis not present

## 2020-06-28 DIAGNOSIS — R5383 Other fatigue: Secondary | ICD-10-CM

## 2020-06-28 DIAGNOSIS — E559 Vitamin D deficiency, unspecified: Secondary | ICD-10-CM

## 2020-06-29 LAB — COMPREHENSIVE METABOLIC PANEL
ALT: 11 IU/L (ref 0–32)
AST: 14 IU/L (ref 0–40)
Albumin/Globulin Ratio: 1.6 (ref 1.2–2.2)
Albumin: 4.5 g/dL (ref 3.8–4.8)
Alkaline Phosphatase: 44 IU/L (ref 44–121)
BUN/Creatinine Ratio: 17 (ref 9–23)
BUN: 16 mg/dL (ref 6–24)
Bilirubin Total: 0.6 mg/dL (ref 0.0–1.2)
CO2: 21 mmol/L (ref 20–29)
Calcium: 9.3 mg/dL (ref 8.7–10.2)
Chloride: 103 mmol/L (ref 96–106)
Creatinine, Ser: 0.94 mg/dL (ref 0.57–1.00)
Globulin, Total: 2.8 g/dL (ref 1.5–4.5)
Glucose: 91 mg/dL (ref 65–99)
Potassium: 4.3 mmol/L (ref 3.5–5.2)
Sodium: 139 mmol/L (ref 134–144)
Total Protein: 7.3 g/dL (ref 6.0–8.5)
eGFR: 77 mL/min/{1.73_m2} (ref >59)

## 2020-06-29 LAB — CBC WITH DIFFERENTIAL/PLATELET
Basophils Absolute: 0 10*3/uL (ref 0.0–0.2)
Basos: 0 %
EOS (ABSOLUTE): 0.1 10*3/uL (ref 0.0–0.4)
Eos: 2 %
Hematocrit: 44.2 % (ref 34.0–46.6)
Hemoglobin: 14.9 g/dL (ref 11.1–15.9)
Immature Grans (Abs): 0 10*3/uL (ref 0.0–0.1)
Immature Granulocytes: 0 %
Lymphocytes Absolute: 1.3 10*3/uL (ref 0.7–3.1)
Lymphs: 19 %
MCH: 31.4 pg (ref 26.6–33.0)
MCHC: 33.7 g/dL (ref 31.5–35.7)
MCV: 93 fL (ref 79–97)
Monocytes Absolute: 0.5 10*3/uL (ref 0.1–0.9)
Monocytes: 7 %
Neutrophils Absolute: 4.8 10*3/uL (ref 1.4–7.0)
Neutrophils: 72 %
Platelets: 209 10*3/uL (ref 150–450)
RBC: 4.74 x10E6/uL (ref 3.77–5.28)
RDW: 11.8 % (ref 11.7–15.4)
WBC: 6.7 10*3/uL (ref 3.4–10.8)

## 2020-06-29 LAB — VITAMIN B12: Vitamin B-12: 416 pg/mL (ref 232–1245)

## 2020-06-29 LAB — HEMOGLOBIN A1C
Est. average glucose Bld gHb Est-mCnc: 108 mg/dL
Hgb A1c MFr Bld: 5.4 % (ref 4.8–5.6)

## 2020-06-29 LAB — LIPID PANEL WITH LDL/HDL RATIO
Cholesterol, Total: 155 mg/dL (ref 100–199)
HDL: 62 mg/dL (ref >39)
LDL Chol Calc (NIH): 81 mg/dL (ref 0–99)
LDL/HDL Ratio: 1.3 ratio (ref 0.0–3.2)
Triglycerides: 57 mg/dL (ref 0–149)
VLDL Cholesterol Cal: 12 mg/dL (ref 5–40)

## 2020-06-29 LAB — VITAMIN D 25 HYDROXY (VIT D DEFICIENCY, FRACTURES): Vit D, 25-Hydroxy: 40.6 ng/mL (ref 30.0–100.0)

## 2020-06-29 LAB — INSULIN, RANDOM: INSULIN: 4 u[IU]/mL (ref 2.6–24.9)

## 2020-07-02 ENCOUNTER — Ambulatory Visit (INDEPENDENT_AMBULATORY_CARE_PROVIDER_SITE_OTHER): Payer: BC Managed Care – PPO | Admitting: Family Medicine

## 2020-07-02 ENCOUNTER — Encounter (INDEPENDENT_AMBULATORY_CARE_PROVIDER_SITE_OTHER): Payer: Self-pay | Admitting: Family Medicine

## 2020-07-03 ENCOUNTER — Telehealth (INDEPENDENT_AMBULATORY_CARE_PROVIDER_SITE_OTHER): Payer: Self-pay | Admitting: Family Medicine

## 2020-07-03 MED ORDER — VYVANSE 60 MG PO CHEW: 60.0000 mg | CHEWABLE_TABLET | Freq: Every day | ORAL | 0 refills | Status: DC

## 2020-07-03 MED ORDER — WEGOVY 0.25 MG/0.5ML ~~LOC~~ SOAJ: 0.2500 mg | SUBCUTANEOUS | 0 refills | Status: DC

## 2020-07-03 MED ORDER — VYVANSE 50 MG PO CHEW
50.0000 mg | CHEWABLE_TABLET | Freq: Every day | ORAL | 0 refills | Status: DC
Start: 2020-07-03 — End: 2020-07-03

## 2020-07-03 MED ORDER — VITAMIN D (ERGOCALCIFEROL) 1.25 MG (50000 UNIT) PO CAPS: 50000.0000 [IU] | ORAL_CAPSULE | ORAL | 0 refills | Status: DC

## 2020-07-03 MED ORDER — VYVANSE 60 MG PO CHEW
60.0000 mg | CHEWABLE_TABLET | Freq: Every day | ORAL | 0 refills | Status: DC
Start: 2020-07-03 — End: 2020-07-03

## 2020-07-03 NOTE — Progress Notes (Signed)
Chief Complaint:   OBESITY Katie Glass is here to discuss her progress with her obesity treatment plan along with follow-up of her obesity related diagnoses.   Today's visit was #: 18 Starting weight: 268 lbs Starting date: 09/21/2019 Today's weight: 235 lbs Today's date: 06/28/2020 Total lbs lost to date: 33 lbs Body mass index is 37.93 kg/m.  Total weight loss percentage to date: -12.31%  Interim History: Katie Glass says she is disappointed in herself for not making enough health food choices. Current Meal Plan: keeping a food journal and adhering to recommended goals of 1800-2000 calories and 125 grams of protein for 65% of the time.  Current Exercise Plan: HIIT/strength training/cardio for 45-90 minutes 5-6 times per week.. Current Anti-Obesity Medications: Vyvanse 40 mg daily. Side effects: None.  Assessment/Plan:   1. Polyphagia Not at goal. Current treatment: None. Polyphagia refers to excessive feelings of hunger.  Plan: She will continue to focus on protein-rich, low simple carbohydrate foods. We reviewed the importance of hydration, regular exercise for stress reduction, and restorative sleep. Start Wegovy 0.25 mg, as per below.  - Start Semaglutide-Weight Management (WEGOVY) 0.25 MG/0.5ML SOAJ; Inject 0.25 mg into the skin once a week.  Dispense: 2 mL; Refill: 0  2. Vitamin D deficiency Not at goal. Current vitamin D is 25, tested on 09/21/2019. Optimal goal > 50 ng/dL.   Plan: Continue to take prescription Vitamin D @50 ,000 IU every week as prescribed. Will check labs today.  - Refill Vitamin D, Ergocalciferol, (DRISDOL) 1.25 MG (50000 UNIT) CAPS capsule; Take 1 capsule (50,000 Units total) by mouth every 7 (seven) days.  Dispense: 4 capsule; Refill: 0 - VITAMIN D 25 Hydroxy (Vit-D Deficiency, Fractures)  3. Insulin resistance At goal. Goal is HgbA1c < 5.7, fasting insulin closer to 5.  Medication: None.    Plan:  She will continue to focus on protein-rich, low  simple carbohydrate foods. We reviewed the importance of hydration, regular exercise for stress reduction, and restorative sleep. Will check labs today.  Lab Results  Component Value Date   HGBA1C 5.4 06/28/2020   Lab Results  Component Value Date   INSULIN 4.0 06/28/2020   INSULIN 7.6 09/21/2019   INSULIN 13.2 05/17/2019   - Comprehensive metabolic panel - Hemoglobin A1c - Insulin, random  4. Other fatigue Will check labs today.  - CBC with Differential/Platelet - Lipid Panel With LDL/HDL Ratio - Vitamin B12  5. Binge eating disorder Katie Glass is taking Vyvanse 40 mg daily for BED.   Plan:  Will increase to Vyvanse 60 mg daily. Will check labs today.  People who binge eat feel as if they don't have control over how much they eat and have feelings of guilt or self-loathing after a binge eating episode. [employer] estimates that about 30 percent of adults with binge eating disorder also have a history of ADHD. The FDA has approved Vyvanse as a treatment option for both ADHD and binge eating. Vyvanse targets the brain's reward center by increasing the levels of dopamine and norepinephrine, the chemicals of the brain responsible for feelings of pleasure. Mindful eating is the recommended nutritional approach to treating BED.   I have consulted the [place] Controlled Substances Registry for this patient, and feel the risk/benefit ratio today is favorable for proceeding with this prescription for a controlled substance. The patient understands monitoring parameters and red flags.   - CBC with Differential/Platelet - Lipid Panel With LDL/HDL Ratio - Vitamin B12  6. Obesity, current BMI 38  Course: Katie Glass is currently in the action stage of change. As such, her goal is to continue with weight loss efforts.   Nutrition goals: She has agreed to keeping a food journal and adhering to recommended goals of 1800-2000 calories and 125 grams of protein.   Exercise goals: As  is.  Behavioral modification strategies: increasing lean protein intake, decreasing simple carbohydrates, increasing vegetables, increasing water intake, decreasing liquid calories, decreasing alcohol intake, decreasing sodium intake and increasing high fiber foods.  Katie Glass has agreed to follow-up with our clinic in 3 weeks. She was informed of the importance of frequent follow-up visits to maximize her success with intensive lifestyle modifications for her multiple health conditions.   Katie Glass was informed we would discuss her lab results at her next visit unless there is a critical issue that needs to be addressed sooner. Katie Glass agreed to keep her next visit at the agreed upon time to discuss these results.  Objective:   Blood pressure 135/82, pulse 66, temperature 98.3 F (36.8 C), temperature source Oral, height 5\' 6"  (1.676 m), weight 235 lb (106.6 kg), SpO2 97 %, unknown if currently breastfeeding. Body mass index is 37.93 kg/m.  General: Cooperative, alert, well developed, in no acute distress. HEENT: Conjunctivae and lids unremarkable. Cardiovascular: Regular rhythm.  Lungs: Normal work of breathing. Neurologic: No focal deficits.   Lab Results  Component Value Date   CREATININE 0.94 06/28/2020   BUN 16 06/28/2020   NA 139 06/28/2020   K 4.3 06/28/2020   CL 103 06/28/2020   CO2 21 06/28/2020   Lab Results  Component Value Date   ALT 11 06/28/2020   AST 14 06/28/2020   ALKPHOS 44 06/28/2020   BILITOT 0.6 06/28/2020   Lab Results  Component Value Date   HGBA1C 5.4 06/28/2020   HGBA1C 5.2 09/21/2019   HGBA1C 5.4 05/17/2019   Lab Results  Component Value Date   INSULIN 4.0 06/28/2020   INSULIN 7.6 09/21/2019   INSULIN 13.2 05/17/2019   Lab Results  Component Value Date   TSH 1.350 05/17/2019   Lab Results  Component Value Date   CHOL 155 06/28/2020   HDL 62 06/28/2020   LDLCALC 81 06/28/2020   TRIG 57 06/28/2020   CHOLHDL 3.3 05/17/2019   Lab Results   Component Value Date   WBC 6.7 06/28/2020   HGB 14.9 06/28/2020   HCT 44.2 06/28/2020   MCV 93 06/28/2020   PLT 209 06/28/2020   Lab Results  Component Value Date   IRON 93 09/21/2019   TIBC 299 09/21/2019   FERRITIN 51 09/21/2019   Attestation Statements:   Reviewed by clinician on day of visit: allergies, medications, problem list, medical history, surgical history, family history, social history, and previous encounter notes.  09/23/2019 Fried[place], CMA, am acting as 08-[birth year]-1972 for Energy manager, DO.  I have reviewed the above documentation for accuracy and completeness, and I agree with the above. [employer], DO

## 2020-07-03 NOTE — Telephone Encounter (Signed)
[facility] called regarding prescription that was called in, it needs to be sent in electronically.

## 2020-07-05 ENCOUNTER — Encounter (INDEPENDENT_AMBULATORY_CARE_PROVIDER_SITE_OTHER): Payer: Self-pay

## 2020-07-16 ENCOUNTER — Encounter (INDEPENDENT_AMBULATORY_CARE_PROVIDER_SITE_OTHER): Payer: Self-pay | Admitting: Family Medicine

## 2020-07-23 ENCOUNTER — Encounter (INDEPENDENT_AMBULATORY_CARE_PROVIDER_SITE_OTHER): Payer: Self-pay | Admitting: Family Medicine

## 2020-07-23 ENCOUNTER — Ambulatory Visit (INDEPENDENT_AMBULATORY_CARE_PROVIDER_SITE_OTHER): Payer: BC Managed Care – PPO | Admitting: Family Medicine

## 2020-07-23 ENCOUNTER — Other Ambulatory Visit: Payer: Self-pay

## 2020-07-23 VITALS — BP 135/79 | HR 68 | Temp 98.3°F | Ht 66.0 in | Wt 236.0 lb

## 2020-07-23 DIAGNOSIS — E559 Vitamin D deficiency, unspecified: Secondary | ICD-10-CM | POA: Diagnosis not present

## 2020-07-23 DIAGNOSIS — Z6841 Body Mass Index (BMI) 40.0 and over, adult: Secondary | ICD-10-CM

## 2020-07-23 DIAGNOSIS — R632 Polyphagia: Secondary | ICD-10-CM

## 2020-07-23 DIAGNOSIS — Z9189 Other specified personal risk factors, not elsewhere classified: Secondary | ICD-10-CM

## 2020-07-23 DIAGNOSIS — F5081 Binge eating disorder: Secondary | ICD-10-CM

## 2020-07-23 MED ORDER — VYVANSE 60 MG PO CHEW: 60.0000 mg | CHEWABLE_TABLET | Freq: Every day | ORAL | 0 refills | Status: DC

## 2020-07-23 MED ORDER — SAXENDA 18 MG/3ML ~~LOC~~ SOPN: 3.0000 mg | PEN_INJECTOR | Freq: Every day | SUBCUTANEOUS | 0 refills | Status: DC

## 2020-07-23 MED ORDER — VITAMIN D (ERGOCALCIFEROL) 1.25 MG (50000 UNIT) PO CAPS: 50000.0000 [IU] | ORAL_CAPSULE | ORAL | 0 refills | Status: DC

## 2020-07-24 ENCOUNTER — Encounter (INDEPENDENT_AMBULATORY_CARE_PROVIDER_SITE_OTHER): Payer: Self-pay

## 2020-07-30 NOTE — Progress Notes (Signed)
Chief Complaint:   OBESITY Katie Glass is here to discuss her progress with her obesity treatment plan along with follow-up of her obesity related diagnoses.   Today's visit was #: 19 Starting weight: 268 lbs Starting date: 09/21/2019 Today's weight: 236 lbs Today's date: 07/23/2020 Total lbs lost to date: 32 lbs Body mass index is 38.09 kg/m.  Total weight loss percentage to date: -11.94%  Current Meal Plan: keeping a food journal and adhering to recommended goals of 1800-2000 calories and 125 grams of protein for 65% of the time.  Current Exercise Plan: HIIT/cardio for 45-60 minutes 5-6 times per week. Current Anti-Obesity Medications: Wegovy 0.25 mg subcutaneously weekly. Side effects: None.  Assessment/Plan:   1. Polyphagia Current treatment: Wegovy 0.25 mg subcutaneously weekly. Polyphagia refers to excessive feelings of hunger. She will continue to focus on protein-rich, low simple carbohydrate foods. We reviewed the importance of hydration, regular exercise for stress reduction, and restorative sleep.  - Start Liraglutide -Weight Management (SAXENDA) 18 MG/3ML SOPN; Inject 3 mg into the skin daily.  Dispense: 15 mL; Refill: 0  2. Vitamin D deficiency Not at goal. Current vitamin D is 40.6, tested on 06/28/2020. Optimal goal > 50 ng/dL.  She is taking vitamin D 50,000 IU weekly.  Plan: Continue to take prescription Vitamin D @50 ,000 IU every week as prescribed.  Follow-up for routine testing of Vitamin D, at least 2-3 times per year to avoid over-replacement.  - Refill Vitamin D, Ergocalciferol, (DRISDOL) 1.25 MG (50000 UNIT) CAPS capsule; Take 1 capsule (50,000 Units total) by mouth every 7 (seven) days.  Dispense: 4 capsule; Refill: 0  3. Binge eating disorder Veretta is taking Vyvanse 60 mg daily.  Plan:  Continue Vyvanse 60 mg daily.  Will refill today.  I have consulted the [place] Controlled Substances Registry for this patient, and feel the risk/benefit ratio today is  favorable for proceeding with this prescription for a controlled substance. The patient understands monitoring parameters and red flags.   People who binge eat feel as if they don't have control over how much they eat and have feelings of guilt or self-loathing after a binge eating episode. [employer] estimates that about 30 percent of adults with binge eating disorder also have a history of ADHD. The FDA has approved Vyvanse as a treatment option for both ADHD and binge eating. Vyvanse targets the brain's reward center by increasing the levels of dopamine and norepinephrine, the chemicals of the brain responsible for feelings of pleasure. Mindful eating is the recommended nutritional approach to treating BED.   - Refill Lisdexamfetamine Dimesylate (VYVANSE) 60 MG CHEW; Chew 60 mg by mouth daily.  Dispense: 30 tablet; Refill: 0  4. At risk for nausea Katie Glass is at risk for nausea due to starting Saxenda. We reviewed ways to decrease side effect of nausea through eating small meals, avoiding high sugar or fat foods, and ultimately going back on the dose if not tolerating. Time > 8 minutes.  5. Obesity, current BMI 38.1  Course: Carrol is currently in the action stage of change. As such, her goal is to continue with weight loss efforts.   Nutrition goals: She has agreed to keeping a food journal and adhering to recommended goals of 1800-2000 calories and 125 grams of protein.   Exercise goals: For substantial health benefits, adults should do at least 150 minutes (2 hours and 30 minutes) a week of moderate-intensity, or 75 minutes (1 hour and 15 minutes) a week of vigorous-intensity aerobic  physical activity, or an equivalent combination of moderate- and vigorous-intensity aerobic activity. Aerobic activity should be performed in episodes of at least 10 minutes, and preferably, it should be spread throughout the week.  Behavioral modification strategies: increasing lean protein intake,  decreasing simple carbohydrates, increasing vegetables and increasing water intake.  Katie Glass has agreed to follow-up with our clinic in 3-4 weeks. She was informed of the importance of frequent follow-up visits to maximize her success with intensive lifestyle modifications for her multiple health conditions.   Objective:   Blood pressure 135/79, pulse 68, temperature 98.3 F (36.8 C), temperature source Oral, height 5\' 6"  (1.676 m), weight 236 lb (107 kg), SpO2 98 %, unknown if currently breastfeeding. Body mass index is 38.09 kg/m.  General: Cooperative, alert, well developed, in no acute distress. HEENT: Conjunctivae and lids unremarkable. Cardiovascular: Regular rhythm.  Lungs: Normal work of breathing. Neurologic: No focal deficits.   Lab Results  Component Value Date   CREATININE 0.94 06/28/2020   BUN 16 06/28/2020   NA 139 06/28/2020   K 4.3 06/28/2020   CL 103 06/28/2020   CO2 21 06/28/2020   Lab Results  Component Value Date   ALT 11 06/28/2020   AST 14 06/28/2020   ALKPHOS 44 06/28/2020   BILITOT 0.6 06/28/2020   Lab Results  Component Value Date   HGBA1C 5.4 06/28/2020   HGBA1C 5.2 09/21/2019   HGBA1C 5.4 05/17/2019   Lab Results  Component Value Date   INSULIN 4.0 06/28/2020   INSULIN 7.6 09/21/2019   INSULIN 13.2 05/17/2019   Lab Results  Component Value Date   TSH 1.350 05/17/2019   Lab Results  Component Value Date   CHOL 155 06/28/2020   HDL 62 06/28/2020   LDLCALC 81 06/28/2020   TRIG 57 06/28/2020   CHOLHDL 3.3 05/17/2019   Lab Results  Component Value Date   WBC 6.7 06/28/2020   HGB 14.9 06/28/2020   HCT 44.2 06/28/2020   MCV 93 06/28/2020   PLT 209 06/28/2020   Lab Results  Component Value Date   IRON 93 09/21/2019   TIBC 299 09/21/2019   FERRITIN 51 09/21/2019   Attestation Statements:   Reviewed by clinician on day of visit: allergies, medications, problem list, medical history, surgical history, family history, social  history, and previous encounter notes.  I, 09/23/2019, CMA, am acting as transcriptionist for Insurance claims handler, DO  I have reviewed the above documentation for accuracy and completeness, and I agree with the above. Helane Rima, DO

## 2020-08-02 ENCOUNTER — Other Ambulatory Visit (INDEPENDENT_AMBULATORY_CARE_PROVIDER_SITE_OTHER): Payer: Self-pay

## 2020-08-02 MED ORDER — INSULIN PEN NEEDLE 32G X 4 MM MISC: 0 refills | Status: DC

## 2020-08-08 ENCOUNTER — Other Ambulatory Visit (INDEPENDENT_AMBULATORY_CARE_PROVIDER_SITE_OTHER): Payer: Self-pay | Admitting: Family Medicine

## 2020-08-08 DIAGNOSIS — F5081 Binge eating disorder: Secondary | ICD-10-CM

## 2020-08-08 MED ORDER — VYVANSE 60 MG PO CHEW: 60.0000 mg | CHEWABLE_TABLET | Freq: Every day | ORAL | 0 refills | Status: DC

## 2020-08-08 NOTE — Telephone Encounter (Signed)
Pt last seen by Dr. Wallace.  

## 2020-08-21 ENCOUNTER — Ambulatory Visit (INDEPENDENT_AMBULATORY_CARE_PROVIDER_SITE_OTHER): Payer: BC Managed Care – PPO | Admitting: Family Medicine

## 2020-08-21 DIAGNOSIS — Z1231 Encounter for screening mammogram for malignant neoplasm of breast: Secondary | ICD-10-CM | POA: Diagnosis not present

## 2020-08-22 ENCOUNTER — Other Ambulatory Visit: Payer: Self-pay | Admitting: Obstetrics and Gynecology

## 2020-08-22 ENCOUNTER — Other Ambulatory Visit: Payer: Self-pay

## 2020-08-22 ENCOUNTER — Ambulatory Visit (INDEPENDENT_AMBULATORY_CARE_PROVIDER_SITE_OTHER): Payer: BC Managed Care – PPO | Admitting: Family Medicine

## 2020-08-22 ENCOUNTER — Encounter (INDEPENDENT_AMBULATORY_CARE_PROVIDER_SITE_OTHER): Payer: Self-pay | Admitting: Family Medicine

## 2020-08-22 VITALS — BP 132/83 | HR 70 | Temp 98.3°F | Ht 66.0 in | Wt 232.0 lb

## 2020-08-22 DIAGNOSIS — F50819 Binge eating disorder, unspecified: Secondary | ICD-10-CM

## 2020-08-22 DIAGNOSIS — R632 Polyphagia: Secondary | ICD-10-CM

## 2020-08-22 DIAGNOSIS — F5081 Binge eating disorder: Secondary | ICD-10-CM | POA: Diagnosis not present

## 2020-08-22 DIAGNOSIS — E559 Vitamin D deficiency, unspecified: Secondary | ICD-10-CM

## 2020-08-22 DIAGNOSIS — Z9189 Other specified personal risk factors, not elsewhere classified: Secondary | ICD-10-CM

## 2020-08-22 DIAGNOSIS — R928 Other abnormal and inconclusive findings on diagnostic imaging of breast: Secondary | ICD-10-CM

## 2020-08-22 DIAGNOSIS — E66813 Obesity, class 3: Secondary | ICD-10-CM

## 2020-08-22 DIAGNOSIS — Z6841 Body Mass Index (BMI) 40.0 and over, adult: Secondary | ICD-10-CM

## 2020-08-22 MED ORDER — VYVANSE 60 MG PO CHEW: 60.0000 mg | CHEWABLE_TABLET | Freq: Every day | ORAL | 0 refills | Status: DC

## 2020-08-22 MED ORDER — SAXENDA 18 MG/3ML ~~LOC~~ SOPN: 3.0000 mg | PEN_INJECTOR | Freq: Every day | SUBCUTANEOUS | 0 refills | Status: DC

## 2020-08-22 MED ORDER — VITAMIN D (ERGOCALCIFEROL) 1.25 MG (50000 UNIT) PO CAPS: 50000.0000 [IU] | ORAL_CAPSULE | ORAL | 0 refills | Status: DC

## 2020-08-30 ENCOUNTER — Encounter (INDEPENDENT_AMBULATORY_CARE_PROVIDER_SITE_OTHER): Payer: Self-pay | Admitting: Family Medicine

## 2020-09-04 NOTE — Progress Notes (Signed)
Chief Complaint:   OBESITY Katie Glass is here to discuss her progress with her obesity treatment plan along with follow-up of her obesity related diagnoses.   Today's visit was #: 20 Starting weight: 268 lbs Starting date: 09/21/2019 Today's weight: 232 lbs Today's date: 08/22/2020 Weight change since last visit: -4 lbs Total lbs lost to date: 36 lbs Body mass index is 37.45 kg/m.  Total weight loss percentage to date: -13.43%  Interim History:Katie Glass reports she went to [place] with her husband and had a great time. Bernie Covey is helping polyphagia but is experiencing some nausea. Current Meal Plan: the Category 4 Plan and keeping a food journal and adhering to recommended goals of 1800-2000 calories and 125 grams of protein for 65% of the time.  Current Exercise Plan: Cardio.HITT/Strength training for 45-60 minutes 5-6 times per week. Current Anti-Obesity Medications: Saxenda. Side effects: some nausea.  Assessment/Plan:   1. Vitamin D deficiency Not at goal. Current vitamin D is 40.6, tested on 06/28/2020. Optimal goal > 50 ng/dL. Plan: Continue to take prescription Vitamin D @50 ,000 IU every week as prescribed.  Follow-up for routine testing of Vitamin D, at least 2-3 times per year to avoid over-replacement.  - Refill Vitamin D, Ergocalciferol, (DRISDOL) 1.25 MG (50000 UNIT) CAPS capsule; Take 1 capsule (50,000 Units total) by mouth every 7 (seven) days.  Dispense: 4 capsule; Refill: 0  2. Binge eating disorder Katie Glass is taking Vyvanse 60 mg daily. Plan:  The current medical regimen is effective;  continue present plan and medications.  I have consulted the [place] Controlled Substances Registry for this patient, and feel the risk/benefit ratio today is favorable for proceeding with this prescription for a controlled substance. The patient understands monitoring parameters and red flags.   - Refill Lisdexamfetamine Dimesylate (VYVANSE) 60 MG CHEW; Chew 60 mg by mouth daily.   Dispense: 30 tablet; Refill: 0  3. Polyphagia Current treatment: Saxenda. Polyphagia refers to excessive feelings of hunger. She will continue to focus on protein-rich, low simple carbohydrate foods. We reviewed the importance of hydration, regular exercise for stress reduction, and restorative sleep.  - Refill Liraglutide -Weight Management (SAXENDA) 18 MG/3ML SOPN; Inject 3 mg into the skin daily.  Dispense: 15 mL; Refill: 0  4. At risk for deficient intake of food Katie Glass was given extensive education and counseling today of more than 10 minutes on risks associated with deficient food intake.  Counseled her on the importance of following our prescribed meal plan and eating adequate amounts of protein.  Discussed with Katie Glass that inadequate food intake over longer periods of time can slow their metabolism down significantly.    5. Obesity, current BMI 37.5 Course: Katie Glass is currently in the action stage of change. As such, her goal is to continue with weight loss efforts.   Nutrition goals: She has agreed to keeping a food journal and adhering to recommended goals of 1800-2000 calories and 125 grams of protein.   Exercise goals: For substantial health benefits, adults should do at least 150 minutes (2 hours and 30 minutes) a week of moderate-intensity, or 75 minutes (1 hour and 15 minutes) a week of vigorous-intensity aerobic physical activity, or an equivalent combination of moderate- and vigorous-intensity aerobic activity. Aerobic activity should be performed in episodes of at least 10 minutes, and preferably, it should be spread throughout the week.  Behavioral modification strategies: increasing lean protein intake, decreasing simple carbohydrates, increasing vegetables, and increasing water intake.  Katie Glass has agreed to follow-up  with our clinic in 4 weeks. She was informed of the importance of frequent follow-up visits to maximize her success with intensive lifestyle  modifications for her multiple health conditions.   Objective:   Blood pressure 132/83, pulse 70, temperature 98.3 F (36.8 C), temperature source Oral, height 5\' 6"  (1.676 m), weight 232 lb (105.2 kg), SpO2 97 %, unknown if currently breastfeeding. Body mass index is 37.45 kg/m.  General: Cooperative, alert, well developed, in no acute distress. HEENT: Conjunctivae and lids unremarkable. Cardiovascular: Regular rhythm.  Lungs: Normal work of breathing. Neurologic: No focal deficits.   Lab Results  Component Value Date   CREATININE 0.94 06/28/2020   BUN 16 06/28/2020   NA 139 06/28/2020   K 4.3 06/28/2020   CL 103 06/28/2020   CO2 21 06/28/2020   Lab Results  Component Value Date   ALT 11 06/28/2020   AST 14 06/28/2020   ALKPHOS 44 06/28/2020   BILITOT 0.6 06/28/2020   Lab Results  Component Value Date   HGBA1C 5.4 06/28/2020   HGBA1C 5.2 09/21/2019   HGBA1C 5.4 05/17/2019   Lab Results  Component Value Date   INSULIN 4.0 06/28/2020   INSULIN 7.6 09/21/2019   INSULIN 13.2 05/17/2019   Lab Results  Component Value Date   TSH 1.350 05/17/2019   Lab Results  Component Value Date   CHOL 155 06/28/2020   HDL 62 06/28/2020   LDLCALC 81 06/28/2020   TRIG 57 06/28/2020   CHOLHDL 3.3 05/17/2019   Lab Results  Component Value Date   WBC 6.7 06/28/2020   HGB 14.9 06/28/2020   HCT 44.2 06/28/2020   MCV 93 06/28/2020   PLT 209 06/28/2020   Lab Results  Component Value Date   IRON 93 09/21/2019   TIBC 299 09/21/2019   FERRITIN 51 09/21/2019   Attestation Statements:   Reviewed by clinician on day of visit: allergies, medications, problem list, medical history, surgical history, family history, social history, and previous encounter notes.  09/23/2019 [place]en[place], CMA, am acting as 08-[birth year]-1972 for Energy manager, DO.  I have reviewed the above documentation for accuracy and completeness, and I agree with the above. [employer], DO

## 2020-09-10 ENCOUNTER — Ambulatory Visit (INDEPENDENT_AMBULATORY_CARE_PROVIDER_SITE_OTHER): Payer: BC Managed Care – PPO | Admitting: Family Medicine

## 2020-09-12 ENCOUNTER — Ambulatory Visit: Payer: Self-pay

## 2020-09-12 ENCOUNTER — Ambulatory Visit
Admission: RE | Admit: 2020-09-12 | Discharge: 2020-09-12 | Disposition: A | Discharge status: Home / Self Care | Payer: BC Managed Care – PPO | Source: Ambulatory Visit | Attending: Obstetrics and Gynecology | Admitting: Obstetrics and Gynecology

## 2020-09-12 ENCOUNTER — Other Ambulatory Visit: Payer: Self-pay

## 2020-09-12 DIAGNOSIS — R928 Other abnormal and inconclusive findings on diagnostic imaging of breast: Secondary | ICD-10-CM

## 2020-09-12 DIAGNOSIS — R922 Inconclusive mammogram: Secondary | ICD-10-CM | POA: Diagnosis not present

## 2020-09-12 IMAGING — MG MM DIGITAL DIAGNOSTIC UNILAT*L* W/ TOMO W/ CAD
6 series · 6 of 18 positions shown · non-contrast
Comparison: Previous exam(s).

CLINICAL DATA: 44-year-old female for further evaluation of
possible LEFT breast asymmetry on screening mammogram.

EXAM:
DIGITAL DIAGNOSTIC UNILATERAL LEFT MAMMOGRAM WITH TOMOSYNTHESIS AND
CAD
TECHNIQUE: Left digital diagnostic mammography and breast tomosynthesis was
performed. The images were evaluated with computer-aided detection.

[L CC synth-2D (1 of 2)]
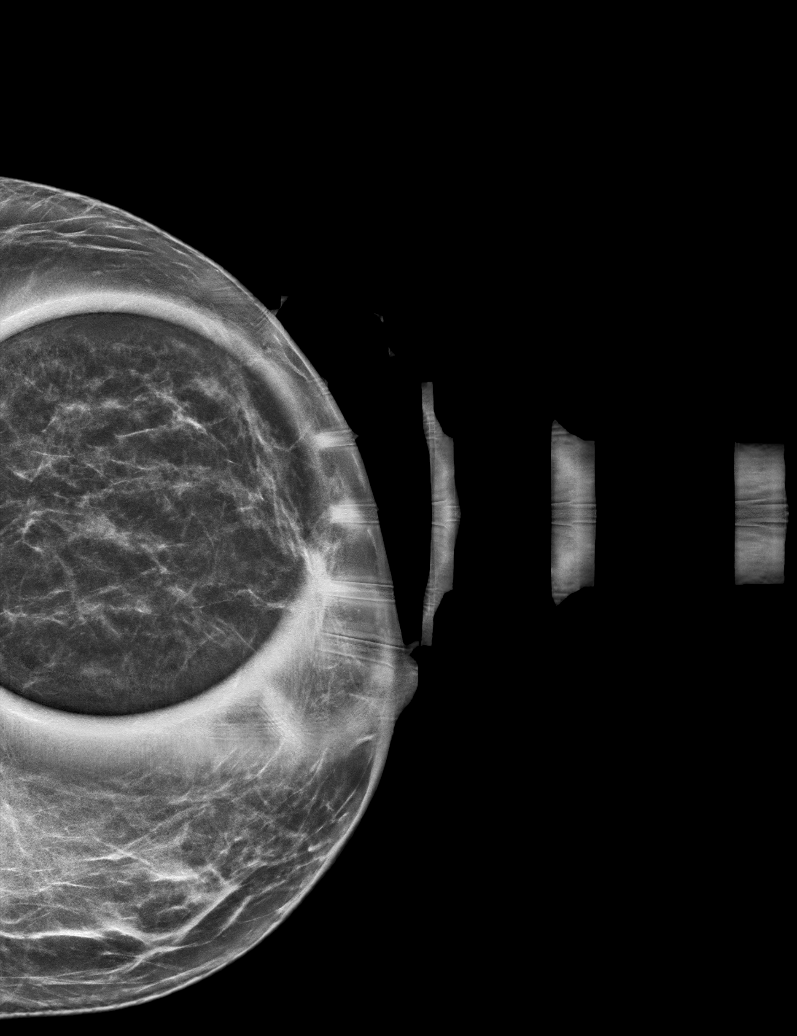

[L CC synth-2D (2 of 2)]
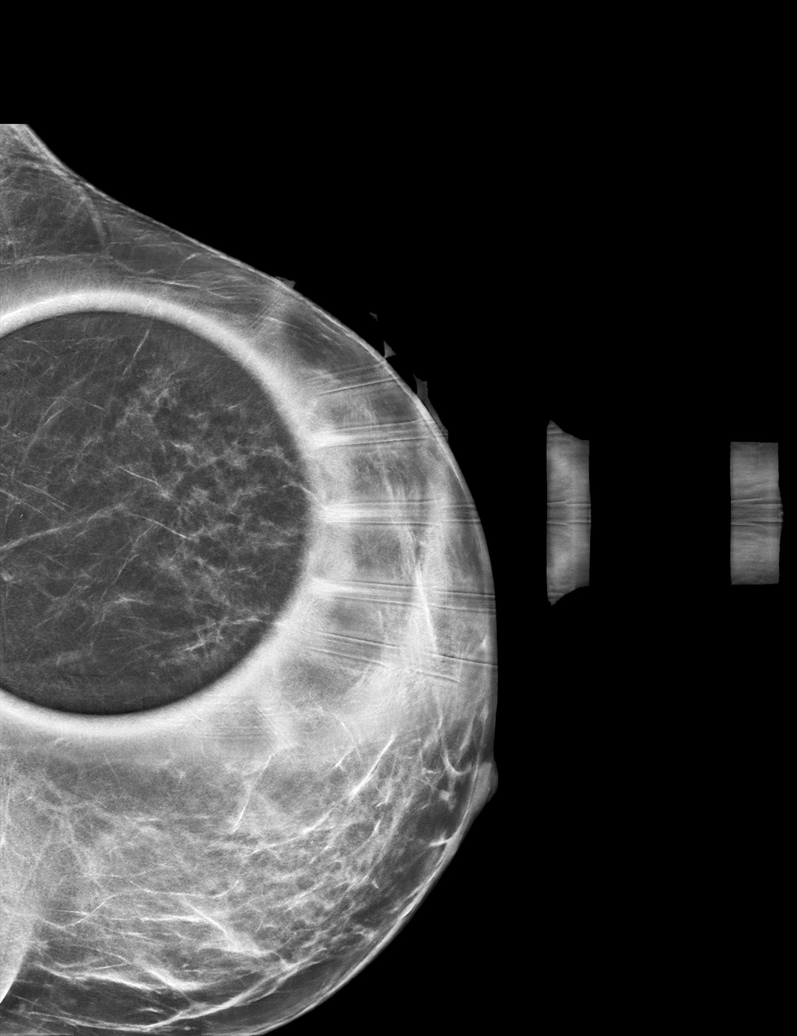

[L ML synth-2D]
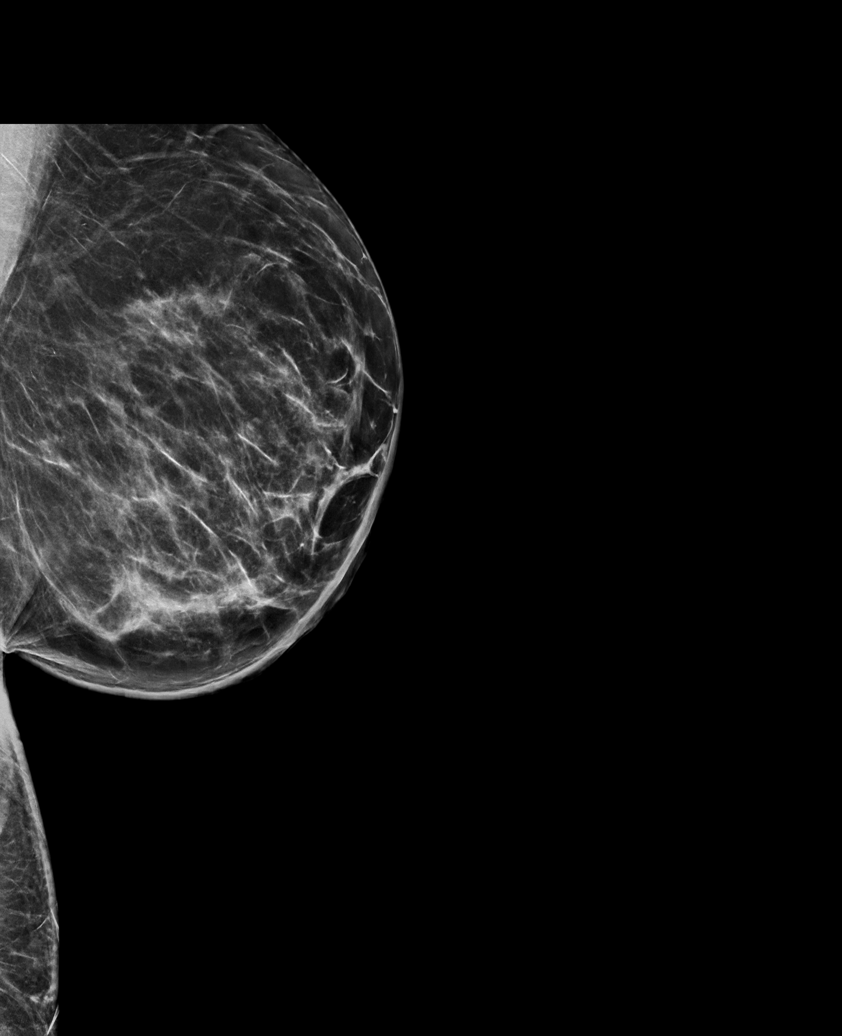

[L CC tomo (1 of 2) · tomo slice 29/56.0]
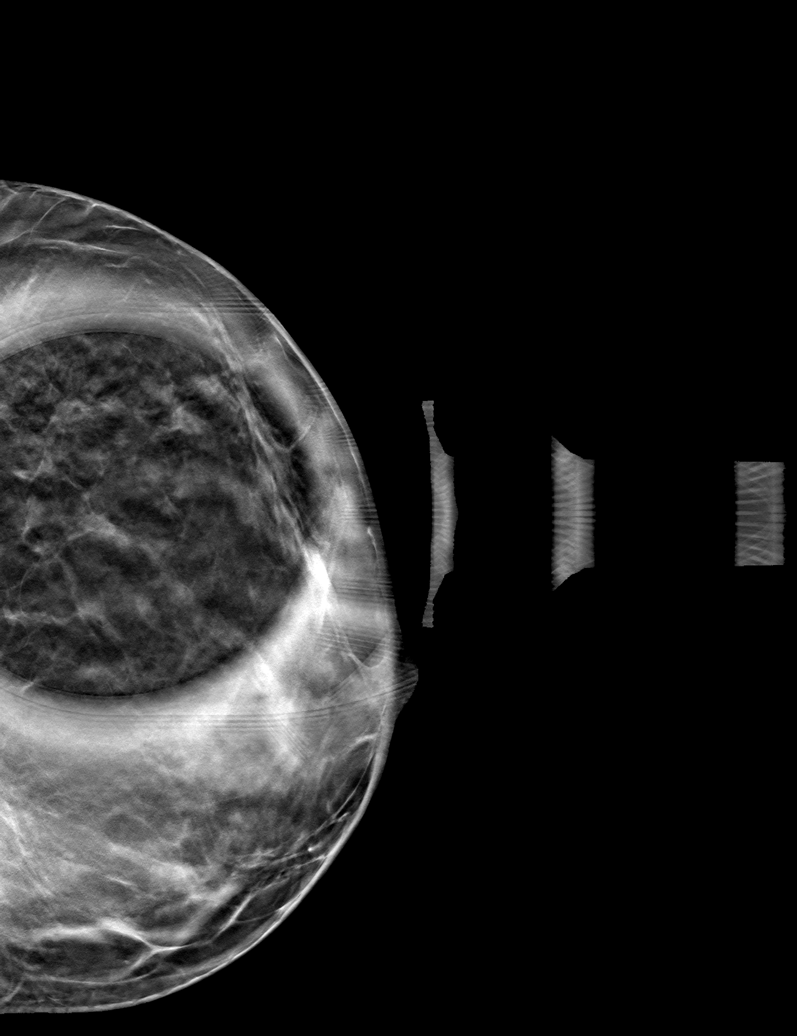

[L CC tomo (2 of 2) · tomo slice 29/56.0]
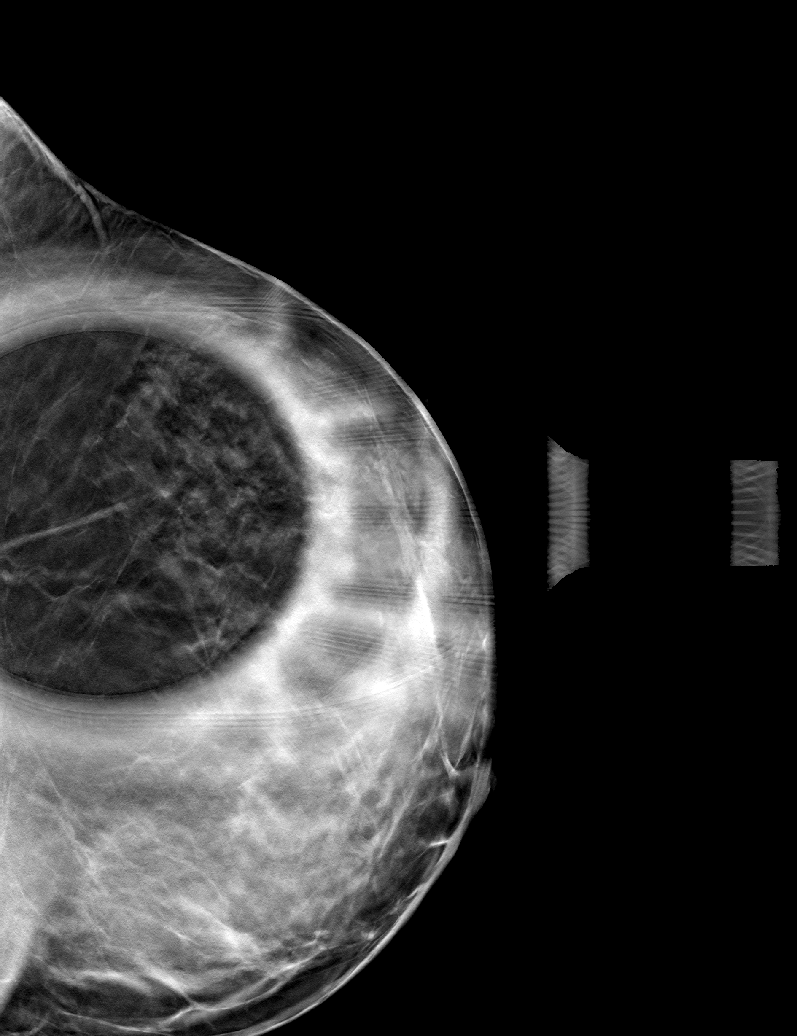

[L ML tomo · tomo slice 37/74.0]
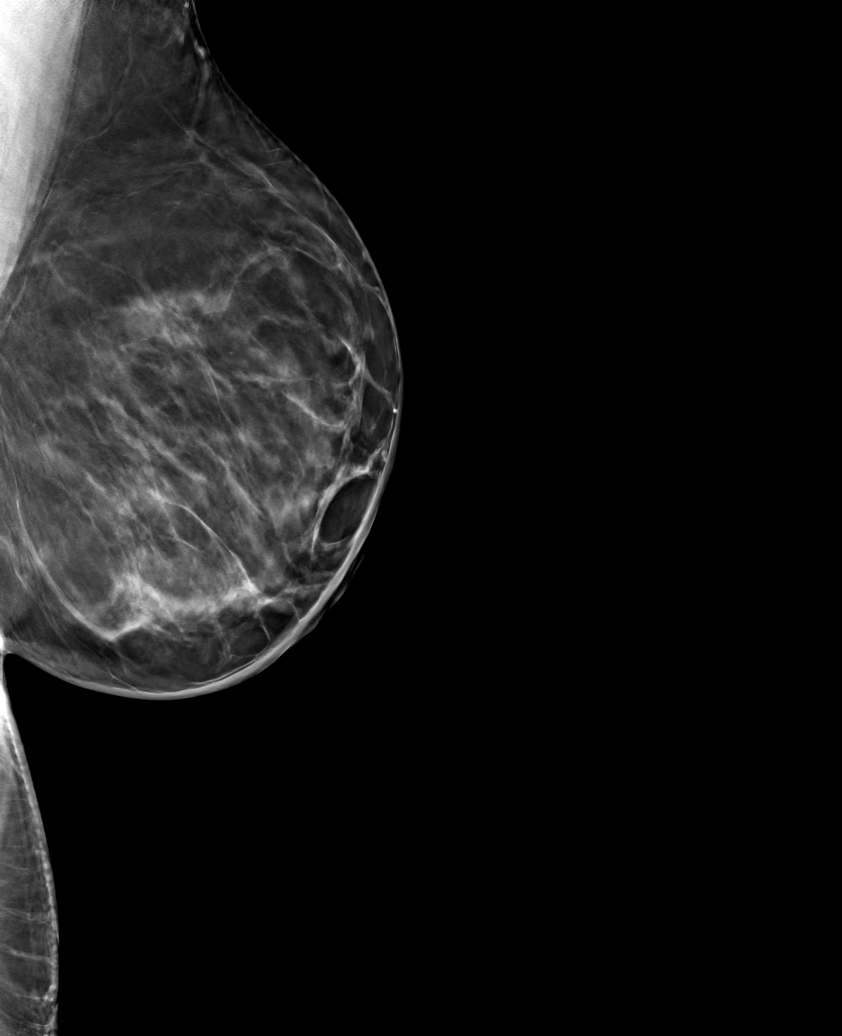

[6 of 18 positions shown; findings below may reference images not displayed]

ACR Breast Density Category b: There are scattered areas of
fibroglandular density.
FINDINGS: 2D/3D full field and spot compression views of the LEFT breast
demonstrate effacement of the OUTER LEFT breast screening study
asymmetry. No persistent abnormalities identified in this area.
IMPRESSION: No persistent abnormality at the site of the screening study
finding.

RECOMMENDATION:
Bilateral screening mammogram in 1 year.

I have discussed the findings and recommendations with the patient.
If applicable, a reminder letter will be sent to the patient
regarding the next appointment.

BI-RADS CATEGORY  1: Negative.

## 2020-09-13 ENCOUNTER — Encounter (INDEPENDENT_AMBULATORY_CARE_PROVIDER_SITE_OTHER): Payer: Self-pay | Admitting: Family Medicine

## 2020-09-13 ENCOUNTER — Ambulatory Visit (INDEPENDENT_AMBULATORY_CARE_PROVIDER_SITE_OTHER): Payer: BC Managed Care – PPO | Admitting: Family Medicine

## 2020-09-13 VITALS — BP 129/81 | HR 96 | Temp 98.2°F | Ht 66.0 in | Wt 230.0 lb

## 2020-09-13 DIAGNOSIS — R632 Polyphagia: Secondary | ICD-10-CM | POA: Diagnosis not present

## 2020-09-13 DIAGNOSIS — Z9189 Other specified personal risk factors, not elsewhere classified: Secondary | ICD-10-CM

## 2020-09-13 DIAGNOSIS — H9201 Otalgia, right ear: Secondary | ICD-10-CM | POA: Diagnosis not present

## 2020-09-13 DIAGNOSIS — E559 Vitamin D deficiency, unspecified: Secondary | ICD-10-CM

## 2020-09-13 DIAGNOSIS — F411 Generalized anxiety disorder: Secondary | ICD-10-CM

## 2020-09-13 DIAGNOSIS — F5081 Binge eating disorder: Secondary | ICD-10-CM | POA: Diagnosis not present

## 2020-09-13 DIAGNOSIS — Z6841 Body Mass Index (BMI) 40.0 and over, adult: Secondary | ICD-10-CM

## 2020-09-13 MED ORDER — SAXENDA 18 MG/3ML ~~LOC~~ SOPN: 3.0000 mg | PEN_INJECTOR | Freq: Every day | SUBCUTANEOUS | 0 refills | Status: DC

## 2020-09-13 MED ORDER — INSULIN PEN NEEDLE 32G X 4 MM MISC: 0 refills | Status: DC

## 2020-09-13 MED ORDER — VITAMIN D (ERGOCALCIFEROL) 1.25 MG (50000 UNIT) PO CAPS: 50000.0000 [IU] | ORAL_CAPSULE | ORAL | 0 refills | Status: DC

## 2020-09-19 NOTE — Progress Notes (Signed)
Chief Complaint:   OBESITY Katie Glass is here to discuss her progress with her obesity treatment plan along with follow-up of her obesity related diagnoses.   Today's visit was #: 21 Starting weight: 268 lbs Starting date: 09/21/2019 Today's weight: 230 lbs Today's date: 09/13/2020 Weight change since last visit: 2 lbs Total lbs lost to date: 38 lbs Body mass index is 37.12 kg/m.  Total weight loss percentage to date: -14.18%  Interim History: Katie Glass's goal is Category 4 for the next few weeks.  She says she is tired and is having chronic right ear pain.  Current Meal Plan: the Category 4 Plan for 60% of the time.  Current Exercise Plan: HIIT, cardio for 45-60 minutes 5-6 times per week. Current Anti-Obesity Medications: Saxenda. Side effects: None.  Assessment/Plan:   1. Right ear pain Recommend OTC Zyrtec and Nasacort as needed.  Will place referral to ENT.  - Ambulatory referral to ENT  2. Vitamin D deficiency Not at goal.  She is taking vitamin D 50,000 IU weekly.  Plan: Continue to take prescription Vitamin D @50 ,000 IU every week as prescribed.  Follow-up for routine testing of Vitamin D, at least 2-3 times per year to avoid over-replacement.  Lab Results  Component Value Date   VD25OH 40.6 06/28/2020   VD25OH 25.0 (L) 09/21/2019   VD25OH 22.9 (L) 05/17/2019   - Refill Vitamin D, Ergocalciferol, (DRISDOL) 1.25 MG (50000 UNIT) CAPS capsule; Take 1 capsule (50,000 Units total) by mouth every 7 (seven) days.  Dispense: 4 capsule; Refill: 0  3. Polyphagia Current treatment: Saxenda. Polyphagia refers to excessive feelings of hunger. She will continue to focus on protein-rich, low simple carbohydrate foods. We reviewed the importance of hydration, regular exercise for stress reduction, and restorative sleep.  - Refill Liraglutide -Weight Management (SAXENDA) 18 MG/3ML SOPN; Inject 3 mg into the skin daily.  Dispense: 15 mL; Refill: 0 - Refill Insulin Pen Needle 32G  X 4 MM MISC; Use daily with saxenda  Dispense: 100 each; Refill: 0  4. Binge eating disorder Katie Glass is taking Vyvanse 60 mg daily. Plan:  The current medical regimen is effective;  continue present plan and medications.   I have consulted the [place] Controlled Substances Registry for this patient, and feel the risk/benefit ratio today is favorable for proceeding with this prescription for a controlled substance. The patient understands monitoring parameters and red flags.   5. GAD (generalized anxiety disorder) She is not taking an SSRI. This should be considered if not improving.   Plan:  Behavior modification techniques were discussed today to help Katie Glass deal with her anxiety.    6. At risk for heart disease Due to Katie Glass's current state of health and medical condition(s), she is at a higher risk for heart disease.  This puts the patient at much greater risk to subsequently develop cardiopulmonary conditions that can significantly affect patient's quality of life in a negative manner.    At least 8 minutes were spent on counseling Katie Glass about these concerns today. Evidence-based interventions for health behavior change were utilized today including the discussion of self monitoring techniques, problem-solving barriers, and SMART goal setting techniques.  Specifically, regarding patient's less desirable eating habits and patterns, we employed the technique of small changes when Katie Glass has not been able to fully commit to her prudent nutritional plan.  7. Obesity, current BMI 37.2  Course: Katie Glass is currently in the action stage of change. As such, her goal is to continue with weight  loss efforts.   Nutrition goals: She has agreed to the Category 4 Plan.   Exercise goals: For substantial health benefits, adults should do at least 150 minutes (2 hours and 30 minutes) a week of moderate-intensity, or 75 minutes (1 hour and 15 minutes) a week of vigorous-intensity aerobic physical activity,  or an equivalent combination of moderate- and vigorous-intensity aerobic activity. Aerobic activity should be performed in episodes of at least 10 minutes, and preferably, it should be spread throughout the week.  Behavioral modification strategies: increasing lean protein intake, decreasing simple carbohydrates, and increasing vegetables.  Katie Glass has agreed to follow-up with our clinic in 4 weeks. She was informed of the importance of frequent follow-up visits to maximize her success with intensive lifestyle modifications for her multiple health conditions.   Objective:   Blood pressure 129/81, pulse 96, temperature 98.2 F (36.8 C), height 5\' 6"  (1.676 m), weight 230 lb (104.3 kg), SpO2 99 %, unknown if currently breastfeeding. Body mass index is 37.12 kg/m.  General: Cooperative, alert, well developed, in no acute distress. HEENT: Conjunctivae and lids unremarkable. Cardiovascular: Regular rhythm.  Lungs: Normal work of breathing. Neurologic: No focal deficits.   Lab Results  Component Value Date   CREATININE 0.94 06/28/2020   BUN 16 06/28/2020   NA 139 06/28/2020   K 4.3 06/28/2020   CL 103 06/28/2020   CO2 21 06/28/2020   Lab Results  Component Value Date   ALT 11 06/28/2020   AST 14 06/28/2020   ALKPHOS 44 06/28/2020   BILITOT 0.6 06/28/2020   Lab Results  Component Value Date   HGBA1C 5.4 06/28/2020   HGBA1C 5.2 09/21/2019   HGBA1C 5.4 05/17/2019   Lab Results  Component Value Date   INSULIN 4.0 06/28/2020   INSULIN 7.6 09/21/2019   INSULIN 13.2 05/17/2019   Lab Results  Component Value Date   TSH 1.350 05/17/2019   Lab Results  Component Value Date   CHOL 155 06/28/2020   HDL 62 06/28/2020   LDLCALC 81 06/28/2020   TRIG 57 06/28/2020   CHOLHDL 3.3 05/17/2019   Lab Results  Component Value Date   VD25OH 40.6 06/28/2020   VD25OH 25.0 (L) 09/21/2019   VD25OH 22.9 (L) 05/17/2019   Lab Results  Component Value Date   WBC 6.7 06/28/2020   HGB  14.9 06/28/2020   HCT 44.2 06/28/2020   MCV 93 06/28/2020   PLT 209 06/28/2020   Lab Results  Component Value Date   IRON 93 09/21/2019   TIBC 299 09/21/2019   FERRITIN 51 09/21/2019   Attestation Statements:   Reviewed by clinician on day of visit: allergies, medications, problem list, medical history, surgical history, family history, social history, and previous encounter notes.  I, 09/23/2019, CMA, am acting as transcriptionist for Insurance claims handler, DO  I have reviewed the above documentation for accuracy and completeness, and I agree with the above. Helane Rima, DO

## 2020-10-04 ENCOUNTER — Other Ambulatory Visit: Payer: Self-pay

## 2020-10-04 ENCOUNTER — Other Ambulatory Visit (INDEPENDENT_AMBULATORY_CARE_PROVIDER_SITE_OTHER): Payer: Self-pay | Admitting: Family Medicine

## 2020-10-04 ENCOUNTER — Encounter (INDEPENDENT_AMBULATORY_CARE_PROVIDER_SITE_OTHER): Payer: Self-pay | Admitting: Family Medicine

## 2020-10-04 ENCOUNTER — Ambulatory Visit (INDEPENDENT_AMBULATORY_CARE_PROVIDER_SITE_OTHER): Payer: BC Managed Care – PPO | Admitting: Family Medicine

## 2020-10-04 VITALS — BP 132/82 | HR 74 | Temp 98.1°F | Ht 66.0 in | Wt 230.0 lb

## 2020-10-04 DIAGNOSIS — R632 Polyphagia: Secondary | ICD-10-CM | POA: Diagnosis not present

## 2020-10-04 DIAGNOSIS — Z9189 Other specified personal risk factors, not elsewhere classified: Secondary | ICD-10-CM | POA: Diagnosis not present

## 2020-10-04 DIAGNOSIS — E559 Vitamin D deficiency, unspecified: Secondary | ICD-10-CM

## 2020-10-04 DIAGNOSIS — R0602 Shortness of breath: Secondary | ICD-10-CM | POA: Diagnosis not present

## 2020-10-04 DIAGNOSIS — F5081 Binge eating disorder: Secondary | ICD-10-CM

## 2020-10-04 DIAGNOSIS — Z6841 Body Mass Index (BMI) 40.0 and over, adult: Secondary | ICD-10-CM

## 2020-10-04 MED ORDER — ONDANSETRON 4 MG PO TBDP: 4.0000 mg | ORAL_TABLET | Freq: Three times a day (TID) | ORAL | 0 refills | Status: DC | PRN

## 2020-10-04 MED ORDER — WEGOVY 1.7 MG/0.75ML ~~LOC~~ SOAJ: 1.7000 mg | SUBCUTANEOUS | 0 refills | Status: DC

## 2020-10-04 MED ORDER — VITAMIN D (ERGOCALCIFEROL) 1.25 MG (50000 UNIT) PO CAPS: 50000.0000 [IU] | ORAL_CAPSULE | ORAL | 0 refills | Status: DC

## 2020-10-16 NOTE — Progress Notes (Signed)
Chief Complaint:   OBESITY Katie Glass is here to discuss her progress with her obesity treatment plan along with follow-up of her obesity related diagnoses.   Today's visit was #: 22 Starting weight: 268 lbs Starting date: 09/21/2019 Today's weight: 230 lbs Today's date: 10/04/2020 Weight change since last visit: 0 Total lbs lost to date: 38 lbs Body mass index is 37.12 kg/m.  Total weight loss percentage to date: -14.18%  Interim History:  Katie Glass endorses polyphagia. Current Meal Plan: the Category 4 Plan for 70% of the time.  Current Exercise Plan: HIIT/strength/cardio for 45-90 minutes 5-6 times per week. Current Anti-Obesity Medications: Saxenda 3 mg subcutaneously daily. Side effects: None.  Assessment/Plan:   Meds ordered this encounter  Medications   Semaglutide-Weight Management (WEGOVY) 1.7 MG/0.75ML SOAJ    Sig: Inject 1.7 mg into the skin once a week.    Dispense:  3 mL    Refill:  0   ondansetron (ZOFRAN ODT) 4 MG disintegrating tablet    Sig: Take 1 tablet (4 mg total) by mouth every 8 (eight) hours as needed for nausea or vomiting.    Dispense:  20 tablet    Refill:  0    1. Polyphagia Not at goal. Current treatment: Saxenda 3 mg subcutaneously daily. Polyphagia refers to excessive feelings of hunger.   Plan:  Stop Saxenda and start Wegovy 1.7 mg subcutaneously weekly.  She will continue to focus on protein-rich, low simple carbohydrate foods. We reviewed the importance of hydration, regular exercise for stress reduction, and restorative sleep.  - Start Semaglutide-Weight Management (WEGOVY) 1.7 MG/0.75ML SOAJ; Inject 1.7 mg into the skin once a week.  Dispense: 3 mL; Refill: 0 - Start ondansetron (ZOFRAN ODT) 4 MG disintegrating tablet; Take 1 tablet (4 mg total) by mouth every 8 (eight) hours as needed for nausea or vomiting.  Dispense: 20 tablet; Refill: 0  2. SOB (shortness of breath) on exertion IC was performed today.  New RMR is 2966.  3. Binge  eating disorder Katie Glass is taking Vyvanse 60 mg daily for BED. The current medical regimen is effective;  continue present plan and medications.  People who binge eat feel as if they don't have control over how much they eat and have feelings of guilt or self-loathing after a binge eating episode. Katie Glass estimates that about 30 percent of adults with binge eating disorder also have a history of ADHD. The FDA has approved Vyvanse as a treatment option for both ADHD and binge eating. Vyvanse targets the brain's reward center by increasing the levels of dopamine and norepinephrine, the chemicals of the brain responsible for feelings of pleasure. Mindful eating is the recommended nutritional approach to treating BED.   4. At risk for deficient intake of food Katie Glass was given extensive education and counseling today of more than 8 minutes on risks associated with deficient food intake.  Counseled her on the importance of following our prescribed meal plan and eating adequate amounts of protein.  Discussed with Katie Glass that inadequate food intake over longer periods of time can slow their metabolism down significantly.   5. Obesity, current BMI 37.2  Course: Katie Glass is currently in the action stage of change. As such, her goal is to continue with weight loss efforts.   Nutrition goals: She has agreed to the Category 4 Plan.   Exercise goals:  As is.  Behavioral modification strategies: increasing lean protein intake, decreasing simple carbohydrates, increasing vegetables, increasing water intake, and decreasing  liquid calories.  Katie Glass has agreed to follow-up with our clinic in 4 weeks. She was informed of the importance of frequent follow-up visits to maximize her success with intensive lifestyle modifications for her multiple health conditions.   Objective:   Blood pressure 132/82, pulse 74, temperature 98.1 F (36.7 C), temperature source Oral, height 5\' 6"  (1.676 m), weight  230 lb (104.3 kg), SpO2 98 %, unknown if currently breastfeeding. Body mass index is 37.12 kg/m.  General: Cooperative, alert, well developed, in no acute distress. HEENT: Conjunctivae and lids unremarkable. Cardiovascular: Regular rhythm.  Lungs: Normal work of breathing. Neurologic: No focal deficits.   Lab Results  Component Value Date   CREATININE 0.94 06/28/2020   BUN 16 06/28/2020   NA 139 06/28/2020   K 4.3 06/28/2020   CL 103 06/28/2020   CO2 21 06/28/2020   Lab Results  Component Value Date   ALT 11 06/28/2020   AST 14 06/28/2020   ALKPHOS 44 06/28/2020   BILITOT 0.6 06/28/2020   Lab Results  Component Value Date   HGBA1C 5.4 06/28/2020   HGBA1C 5.2 09/21/2019   HGBA1C 5.4 05/17/2019   Lab Results  Component Value Date   INSULIN 4.0 06/28/2020   INSULIN 7.6 09/21/2019   INSULIN 13.2 05/17/2019   Lab Results  Component Value Date   TSH 1.350 05/17/2019   Lab Results  Component Value Date   CHOL 155 06/28/2020   HDL 62 06/28/2020   LDLCALC 81 06/28/2020   TRIG 57 06/28/2020   CHOLHDL 3.3 05/17/2019   Lab Results  Component Value Date   VD25OH 40.6 06/28/2020   VD25OH 25.0 (L) 09/21/2019   VD25OH 22.9 (L) 05/17/2019   Lab Results  Component Value Date   WBC 6.7 06/28/2020   HGB 14.9 06/28/2020   HCT 44.2 06/28/2020   MCV 93 06/28/2020   PLT 209 06/28/2020   Lab Results  Component Value Date   IRON 93 09/21/2019   TIBC 299 09/21/2019   FERRITIN 51 09/21/2019   Attestation Statements:   Reviewed by clinician on day of visit: allergies, medications, problem list, medical history, surgical history, family history, social history, and previous encounter notes.  I, 09/23/2019, CMA, am acting as transcriptionist for Insurance claims handler, DO  I have reviewed the above documentation for accuracy and completeness, and I agree with the above. Helane Rima, DO

## 2020-10-22 ENCOUNTER — Ambulatory Visit (INDEPENDENT_AMBULATORY_CARE_PROVIDER_SITE_OTHER): Payer: BC Managed Care – PPO | Admitting: Otolaryngology

## 2020-10-22 ENCOUNTER — Other Ambulatory Visit: Payer: Self-pay

## 2020-10-22 DIAGNOSIS — H9201 Otalgia, right ear: Secondary | ICD-10-CM

## 2020-10-22 NOTE — Progress Notes (Signed)
HPI: Katie Glass is a 45 y.o. female who presents is referred by Dr. Earlene Plater for evaluation of right ear complaints.  She is complained of intermittent discomfort in the right ear.  Not really intense pain but more of a sensation of fullness in the ear or something "crawling" in the ear.  The symptoms occur intermittently and are not continuous.  She does use ear buds in her ears.  She has not noted any hearing problems..  Past Medical History:  Diagnosis Date   Arthritis    knees   Asthma    as a child   Back pain    Food allergy    Tannin/sulfate in Wine and ciders, rash on face and hands   Joint pain    Lower extremity edema    MMT (medial meniscus tear)    right   Sleep apnea    CPAP nightly   Umbilical hernia    Past Surgical History:  Procedure Laterality Date   BILATERAL SALPINGECTOMY Bilateral 04/15/2018   Procedure: BILATERAL SALPINGECTOMY;  Surgeon: Carrington Clamp, MD;  Location: [facility] BIRTHING SU[facility];  Service: Obstetrics;  Laterality: Bilateral;   CESAREAN SECTION  11/12/2010   Procedure: CESAREAN SECTION;  Surgeon: Almon Hercules;  Location: [facility] ORS;  Service: Gynecology;  Laterality: N/A;   CESAREAN SECTION N/A 04/15/2018   Procedure: CESAREAN SECTION;  Surgeon: Carrington Clamp, MD;  Location: [facility] BIRTHING SUIT[facility];  Service: Obstetrics;  Laterality: N/A;   KNEE ARTHROSCOPY Right 02/11/2017   Procedure: RIGHT KNEE ARTHROSCOPY WITH DEBRIDEMENT CHONDROMALACIA;  Surgeon: Gean Birchwood, MD;  Location: [facility];  Service: Orthopedics;  Laterality: Right;   Social History   Socioeconomic History   Marital status: Married    Spouse name: Latrece Nitta   Number of children: Not on file   Years of education: Not on file   Highest education level: Not on file  Occupational History   Occupation: Education officer, environmental  Tobacco Use   Smoking status: Never   Smokeless tobacco: Never  Vaping Use   Vaping Use: Never used  Substance and Sexual Activity   Alcohol use:  Yes    Comment: social   Drug use: No   Sexual activity: Yes    Birth control/protection: None  Other Topics Concern   Not on file  Social History Narrative   Not on file   Social Determinants of Health   Financial Resource Strain: Not on file  Food Insecurity: Not on file  Transportation Needs: Not on file  Physical Activity: Not on file  Stress: Not on file  Social Connections: Not on file   Family History  Problem Relation Age of Onset   Hypertension Father    Hyperlipidemia Father    Heart disease Father    Cancer Maternal Grandfather    Heart disease Paternal Grandfather    Pulmonary embolism Mother    Alcohol abuse Mother    No Known Allergies Prior to Admission medications   Medication Sig Start Date End Date Taking? Authorizing Provider  acetaminophen (TYLENOL) 500 MG tablet Take 500 mg by mouth every 6 (six) hours as needed.    [provider]  cetirizine (ZYRTEC) 10 MG tablet Take 10 mg by mouth daily.    [provider]  ibuprofen (ADVIL) 200 MG tablet Take 200 mg by mouth every 6 (six) hours as needed.    [provider]  Insulin Pen Needle 32G X 4 MM MISC Use daily with saxenda 09/13/20   Helane Rima,  DO  levonorgestrel (MIRENA) 20 MCG/24HR IUD 1 each by Intrauterine route once.    [provider]  Lisdexamfetamine Dimesylate (VYVANSE) 60 MG CHEW Chew 60 mg by mouth daily. 08/22/20   Helane Rima, DO  Multiple Vitamins-Minerals (WOMENS MULTI VITAMIN & MINERAL PO) Take 1 tablet by mouth daily.    [provider]  ondansetron (ZOFRAN ODT) 4 MG disintegrating tablet Take 1 tablet (4 mg total) by mouth every 8 (eight) hours as needed for nausea or vomiting. 10/04/20   Helane Rima, DO  Semaglutide-Weight Management (WEGOVY) 1.7 MG/0.75ML SOAJ Inject 1.7 mg into the skin once a week. 10/04/20   Helane Rima, DO  Vitamin D, Ergocalciferol, (DRISDOL) 1.25 MG (50000 UNIT) CAPS capsule Take 1 capsule (50,000 Units total) by  mouth every 7 (seven) days. 10/04/20   Helane Rima, DO     Positive ROS: Otherwise negative  All other systems have been reviewed and were otherwise negative with the exception of those mentioned in the HPI and as above.  Physical Exam: Constitutional: Alert, well-appearing, no acute distress Ears: External ears without lesions or tenderness.  On microscopic exam ear canals are clear bilaterally.  She had minimal wax buildup on the right side that was removed with a curette but this was nonobstructing and not causing any discomfort.  There are no inflammatory changes of the ear canal.  Both TMs are clear with good mobility on pneumatic otoscopy.  On hearing screening with the 512 1024 tuning fork she heard well in both ears with symmetric hearing with AC > BC bilaterally. Nasal: External nose without lesions. Septum with minimal deformity and mild rhinitis.. Clear nasal passages Oral: Lips and gums without lesions. Tongue and palate mucosa without lesions. Posterior oropharynx clear. Neck: No palpable adenopathy or masses Respiratory: Breathing comfortably  Skin: No facial/neck lesions or rash noted.  Procedures  Assessment: Intermittent right ear discomfort questionable etiology as she has a normal examination.  Plan: Did not recommend any specific therapy for this at this point.  Did discuss with her concerning possible use of alcohol vinegar ear rinses if she has any discomfort in the ear.  She is not a big wax producer and there are no inflammatory changes of the ear canal today.   Narda Bonds, MD   CC:

## 2020-10-24 ENCOUNTER — Ambulatory Visit (INDEPENDENT_AMBULATORY_CARE_PROVIDER_SITE_OTHER): Payer: BC Managed Care – PPO | Admitting: Family Medicine

## 2020-10-24 ENCOUNTER — Other Ambulatory Visit: Payer: Self-pay

## 2020-10-24 ENCOUNTER — Encounter (INDEPENDENT_AMBULATORY_CARE_PROVIDER_SITE_OTHER): Payer: Self-pay | Admitting: Family Medicine

## 2020-10-24 VITALS — BP 130/82 | HR 92 | Temp 98.5°F | Ht 66.0 in | Wt 224.0 lb

## 2020-10-24 DIAGNOSIS — E88819 Insulin resistance, unspecified: Secondary | ICD-10-CM

## 2020-10-24 DIAGNOSIS — R632 Polyphagia: Secondary | ICD-10-CM

## 2020-10-24 DIAGNOSIS — Z9189 Other specified personal risk factors, not elsewhere classified: Secondary | ICD-10-CM

## 2020-10-24 DIAGNOSIS — E559 Vitamin D deficiency, unspecified: Secondary | ICD-10-CM | POA: Diagnosis not present

## 2020-10-24 DIAGNOSIS — E8881 Metabolic syndrome: Secondary | ICD-10-CM | POA: Diagnosis not present

## 2020-10-24 DIAGNOSIS — F5081 Binge eating disorder: Secondary | ICD-10-CM

## 2020-10-24 DIAGNOSIS — Z6841 Body Mass Index (BMI) 40.0 and over, adult: Secondary | ICD-10-CM

## 2020-10-24 MED ORDER — WEGOVY 1.7 MG/0.75ML ~~LOC~~ SOAJ: 1.7000 mg | SUBCUTANEOUS | 0 refills | Status: DC

## 2020-10-24 MED ORDER — VITAMIN D (ERGOCALCIFEROL) 1.25 MG (50000 UNIT) PO CAPS: 50000.0000 [IU] | ORAL_CAPSULE | ORAL | 0 refills | Status: DC

## 2020-10-24 MED ORDER — VYVANSE 60 MG PO CHEW
60.0000 mg | CHEWABLE_TABLET | Freq: Every day | ORAL | 0 refills | Status: DC
Start: 2020-10-24 — End: 2020-11-19

## 2020-10-29 NOTE — Progress Notes (Signed)
Chief Complaint:   OBESITY Katie Glass is here to discuss her progress with her obesity treatment plan along with follow-up of her obesity related diagnoses.   Today's visit was #: 23 Starting weight: 268 lbs Starting date: 09/21/2019 Today's weight: 224 lbs Today's date: 10/24/2020 Weight change since last visit: 6 lbs Total lbs lost to date: 44 lbs Body mass index is 36.15 kg/m.  Total weight loss percentage to date: -16.42%  Interim History:  Katie Glass says she is starting therapy next week.  Denies polyphagia. Current Meal Plan: the Category 4 Plan for 75% of the time.  Current Exercise Plan: Cardio/strength training/HIIT for 45-60 minutes 5-6 times per week. Current Anti-Obesity Medications: Wegovy 1.7 mg subcutaneously weekly. Side effects: None.  Assessment/Plan:   1. Insulin resistance At goal. Goal is HgbA1c < 5.7, fasting insulin closer to 5.  Medication: Wegovy 1.7 mg subcutaneously weekly.    Plan:  She will continue to focus on protein-rich, low simple carbohydrate foods. We reviewed the importance of hydration, regular exercise for stress reduction, and restorative sleep. She will continue Wegovy.  Lab Results  Component Value Date   HGBA1C 5.4 06/28/2020   Lab Results  Component Value Date   INSULIN 4.0 06/28/2020   INSULIN 7.6 09/21/2019   INSULIN 13.2 05/17/2019   2. Polyphagia Controlled. Current treatment: Wegovy 1.7 mg subcutaneously weekly. She will continue to focus on protein-rich, low simple carbohydrate foods. We reviewed the importance of hydration, regular exercise for stress reduction, and restorative sleep.  Plan:  Continue [id number].  Will refill today.  - Refill Semaglutide-Weight Management (WEGOVY) 1.7 MG/0.75ML SOAJ; Inject 1.7 mg into the skin once a week.  Dispense: 3 mL; Refill: 0  3. Vitamin D deficiency Improving, but not optimized.  She is taking vitamin D 50,000 IU weekly.  Plan: Continue to take prescription Vitamin D @50 ,000 IU  every week as prescribed.  Follow-up for routine testing of Vitamin D, at least 2-3 times per year to avoid over-replacement.  Lab Results  Component Value Date   VD25OH 40.6 06/28/2020   VD25OH 25.0 (L) 09/21/2019   VD25OH 22.9 (L) 05/17/2019   - Refill Vitamin D, Ergocalciferol, (DRISDOL) 1.25 MG (50000 UNIT) CAPS capsule; Take 1 capsule (50,000 Units total) by mouth every 7 (seven) days.  Dispense: 4 capsule; Refill: 0  4. Binge eating disorder Katie Glass is taking Vyvanse 60 mg daily for BED. The current medical regimen is effective;  continue present plan and medications.    Plan:  Will refill Vyvanse today.  People who binge eat feel as if they don't have control over how much they eat and have feelings of guilt or self-loathing after a binge eating episode. [employer] estimates that about 30 percent of adults with binge eating disorder also have a history of ADHD. The FDA has approved Vyvanse as a treatment option for both ADHD and binge eating. Vyvanse targets the brain's reward center by increasing the levels of dopamine and norepinephrine, the chemicals of the brain responsible for feelings of pleasure. Mindful eating is the recommended nutritional approach to treating BED.   I have consulted the  Controlled Substances Registry for this patient, and feel the risk/benefit ratio today is favorable for proceeding with this prescription for a controlled substance. The patient understands monitoring parameters and red flags.   - Refill Lisdexamfetamine Dimesylate (VYVANSE) 60 MG CHEW; Chew 60 mg by mouth daily.  Dispense: 30 tablet; Refill: 0  5. At risk for deficient intake of food  Katie Glass was given extensive education and counseling today of more than 8 minutes on risks associated with deficient food intake.  Counseled her on the importance of following our prescribed meal plan and eating adequate amounts of protein.  Discussed with Katie Glass that inadequate food intake over longer  periods of time can slow their metabolism down significantly.    6. Obesity, current BMI 36.2  Course: Katie Glass is currently in the action stage of change. As such, her goal is to continue with weight loss efforts.   Nutrition goals: She has agreed to the Category 4 Plan.   Exercise goals:  As is.  Behavioral modification strategies: increasing water intake.  Bonita has agreed to follow-up with our clinic in 4 weeks. She was informed of the importance of frequent follow-up visits to maximize her success with intensive lifestyle modifications for her multiple health conditions.   Objective:   Blood pressure 130/82, pulse 92, temperature 98.5 F (36.9 C), temperature source Oral, height 5\' 6"  (1.676 m), weight 224 lb (101.6 kg), SpO2 95 %, unknown if currently breastfeeding. Body mass index is 36.15 kg/m.  General: Cooperative, alert, well developed, in no acute distress. HEENT: Conjunctivae and lids unremarkable. Cardiovascular: Regular rhythm.  Lungs: Normal work of breathing. Neurologic: No focal deficits.   Lab Results  Component Value Date   CREATININE 0.94 06/28/2020   BUN 16 06/28/2020   NA 139 06/28/2020   K 4.3 06/28/2020   CL 103 06/28/2020   CO2 21 06/28/2020   Lab Results  Component Value Date   ALT 11 06/28/2020   AST 14 06/28/2020   ALKPHOS 44 06/28/2020   BILITOT 0.6 06/28/2020   Lab Results  Component Value Date   HGBA1C 5.4 06/28/2020   HGBA1C 5.2 09/21/2019   HGBA1C 5.4 05/17/2019   Lab Results  Component Value Date   INSULIN 4.0 06/28/2020   INSULIN 7.6 09/21/2019   INSULIN 13.2 05/17/2019   Lab Results  Component Value Date   TSH 1.350 05/17/2019   Lab Results  Component Value Date   CHOL 155 06/28/2020   HDL 62 06/28/2020   LDLCALC 81 06/28/2020   TRIG 57 06/28/2020   CHOLHDL 3.3 05/17/2019   Lab Results  Component Value Date   VD25OH 40.6 06/28/2020   VD25OH 25.0 (L) 09/21/2019   VD25OH 22.9 (L) 05/17/2019   Lab Results   Component Value Date   WBC 6.7 06/28/2020   HGB 14.9 06/28/2020   HCT 44.2 06/28/2020   MCV 93 06/28/2020   PLT 209 06/28/2020   Lab Results  Component Value Date   IRON 93 09/21/2019   TIBC 299 09/21/2019   FERRITIN 51 09/21/2019   Attestation Statements:   Reviewed by clinician on day of visit: allergies, medications, problem list, medical history, surgical history, family history, social history, and previous encounter notes.  I, 09/23/2019, CMA, am acting as transcriptionist for Insurance claims handler, DO  I have reviewed the above documentation for accuracy and completeness, and I agree with the above. Helane Rima, DO

## 2020-11-19 ENCOUNTER — Ambulatory Visit (INDEPENDENT_AMBULATORY_CARE_PROVIDER_SITE_OTHER): Payer: BC Managed Care – PPO | Admitting: Family Medicine

## 2020-11-19 ENCOUNTER — Other Ambulatory Visit: Payer: Self-pay

## 2020-11-19 ENCOUNTER — Encounter (INDEPENDENT_AMBULATORY_CARE_PROVIDER_SITE_OTHER): Payer: Self-pay | Admitting: Family Medicine

## 2020-11-19 VITALS — BP 138/86 | HR 95 | Temp 98.1°F | Ht 66.0 in | Wt 224.0 lb

## 2020-11-19 DIAGNOSIS — Z6841 Body Mass Index (BMI) 40.0 and over, adult: Secondary | ICD-10-CM

## 2020-11-19 DIAGNOSIS — R632 Polyphagia: Secondary | ICD-10-CM | POA: Diagnosis not present

## 2020-11-19 DIAGNOSIS — E559 Vitamin D deficiency, unspecified: Secondary | ICD-10-CM

## 2020-11-19 DIAGNOSIS — F5081 Binge eating disorder: Secondary | ICD-10-CM | POA: Diagnosis not present

## 2020-11-19 MED ORDER — VYVANSE 60 MG PO CHEW
60.0000 mg | CHEWABLE_TABLET | Freq: Every day | ORAL | 0 refills | Status: DC
Start: 2020-11-19 — End: 2020-12-18

## 2020-11-19 MED ORDER — WEGOVY 1.7 MG/0.75ML ~~LOC~~ SOAJ
1.7000 mg | SUBCUTANEOUS | 0 refills | Status: DC
Start: 2020-11-19 — End: 2020-12-18

## 2020-11-19 MED ORDER — VITAMIN D (ERGOCALCIFEROL) 1.25 MG (50000 UNIT) PO CAPS: 50000.0000 [IU] | ORAL_CAPSULE | ORAL | 0 refills | Status: DC

## 2020-11-20 NOTE — Progress Notes (Signed)
Chief Complaint:   OBESITY Katie Glass is here to discuss her progress with her obesity treatment plan along with follow-up of her obesity related diagnoses.   Today's visit was #: 24 Starting weight: 268 lbs Starting date: 09/21/2019 Today's weight: 224 lbs Today's date: 11/19/2020 Weight change since last visit: 0 Total lbs lost to date: 44 lbs Body mass index is 36.15 kg/m.  Total weight loss percentage to date: -16.42%  Current Meal Plan: the Category 4 Plan for 40% of the time.  Current Exercise Plan: HIIT/Zumba/walking for 30-60 minutes 5-6 times per week. Current Anti-Obesity Medications: Wegovy 1.7 mg subcutaneously weekly. Side effects: None.  Interim History:  Katie Glass says she had a great time on vacation.  She is happy to have maintained.  Assessment/Plan:   1. Polyphagia Controlled. Current treatment: Wegovy 1.7 mg subcutaneously. She will continue to focus on protein-rich, low simple carbohydrate foods. We reviewed the importance of hydration, regular exercise for stress reduction, and restorative sleep.  - Refill Semaglutide-Weight Management (WEGOVY) 1.7 MG/0.75ML SOAJ; Inject 1.7 mg into the skin once a week.  Dispense: 3 mL; Refill: 0  2. Vitamin D deficiency Not at goal.  She is taking vitamin D 50,000 IU weekly.  Plan: Continue to take prescription Vitamin D @50 ,000 IU every week as prescribed.  Follow-up for routine testing of Vitamin D, at least 2-3 times per year to avoid over-replacement.  Lab Results  Component Value Date   VD25OH 40.6 06/28/2020   VD25OH 25.0 (L) 09/21/2019   VD25OH 22.9 (L) 05/17/2019   - Refill Vitamin D, Ergocalciferol, (DRISDOL) 1.25 MG (50000 UNIT) CAPS capsule; Take 1 capsule (50,000 Units total) by mouth every 7 (seven) days.  Dispense: 4 capsule; Refill: 0  3. Binge eating disorder Katie Glass is taking Vyvanse 60 mg daily for BED.  The current medical regimen is effective;  continue present plan and medications.  - Refill  Lisdexamfetamine Dimesylate (VYVANSE) 60 MG CHEW; Chew 60 mg by mouth daily.  Dispense: 30 tablet; Refill: 0  I have consulted the [place] Controlled Substances Registry for this patient, and feel the risk/benefit ratio today is favorable for proceeding with this prescription for a controlled substance. The patient understands monitoring parameters and red flags.   4. Obesity, current BMI 36.2  Course: Katie Glass is currently in the action stage of change. As such, her goal is to continue with weight loss efforts.   Nutrition goals: She has agreed to the Category 4 Plan.   Exercise goals:  As is.  Behavioral modification strategies: increasing lean protein intake, decreasing simple carbohydrates, increasing vegetables, and increasing water intake.  Katie Glass has agreed to follow-up with our clinic in 4 weeks. She was informed of the importance of frequent follow-up visits to maximize her success with intensive lifestyle modifications for her multiple health conditions.   Objective:   Blood pressure 138/86, pulse 95, temperature 98.1 F (36.7 C), temperature source Oral, height 5\' 6"  (1.676 m), weight 224 lb (101.6 kg), SpO2 98 %, unknown if currently breastfeeding. Body mass index is 36.15 kg/m.  General: Cooperative, alert, well developed, in no acute distress. HEENT: Conjunctivae and lids unremarkable. Cardiovascular: Regular rhythm.  Lungs: Normal work of breathing. Neurologic: No focal deficits.   Lab Results  Component Value Date   CREATININE 0.94 06/28/2020   BUN 16 06/28/2020   NA 139 06/28/2020   K 4.3 06/28/2020   CL 103 06/28/2020   CO2 21 06/28/2020   Lab Results  Component Value Date  ALT 11 06/28/2020   AST 14 06/28/2020   ALKPHOS 44 06/28/2020   BILITOT 0.6 06/28/2020   Lab Results  Component Value Date   HGBA1C 5.4 06/28/2020   HGBA1C 5.2 09/21/2019   HGBA1C 5.4 05/17/2019   Lab Results  Component Value Date   INSULIN 4.0 06/28/2020   INSULIN 7.6 09/21/2019    INSULIN 13.2 05/17/2019   Lab Results  Component Value Date   TSH 1.350 05/17/2019   Lab Results  Component Value Date   CHOL 155 06/28/2020   HDL 62 06/28/2020   LDLCALC 81 06/28/2020   TRIG 57 06/28/2020   CHOLHDL 3.3 05/17/2019   Lab Results  Component Value Date   VD25OH 40.6 06/28/2020   VD25OH 25.0 (L) 09/21/2019   VD25OH 22.9 (L) 05/17/2019   Lab Results  Component Value Date   WBC 6.7 06/28/2020   HGB 14.9 06/28/2020   HCT 44.2 06/28/2020   MCV 93 06/28/2020   PLT 209 06/28/2020   Lab Results  Component Value Date   IRON 93 09/21/2019   TIBC 299 09/21/2019   FERRITIN 51 09/21/2019   Attestation Statements:   Reviewed by clinician on day of visit: allergies, medications, problem list, medical history, surgical history, family history, social history, and previous encounter notes.  I, Insurance claims handler, CMA, am acting as transcriptionist for Helane Rima, DO  I have reviewed the above documentation for accuracy and completeness, and I agree with the above. Helane Rima, DO

## 2020-11-29 ENCOUNTER — Encounter (INDEPENDENT_AMBULATORY_CARE_PROVIDER_SITE_OTHER): Payer: Self-pay

## 2020-12-18 ENCOUNTER — Other Ambulatory Visit: Payer: Self-pay

## 2020-12-18 ENCOUNTER — Encounter (INDEPENDENT_AMBULATORY_CARE_PROVIDER_SITE_OTHER): Payer: Self-pay | Admitting: Family Medicine

## 2020-12-18 ENCOUNTER — Ambulatory Visit (INDEPENDENT_AMBULATORY_CARE_PROVIDER_SITE_OTHER): Payer: BC Managed Care – PPO | Admitting: Family Medicine

## 2020-12-18 VITALS — BP 145/89 | HR 73 | Temp 97.8°F | Ht 66.0 in | Wt 224.0 lb

## 2020-12-18 DIAGNOSIS — F5081 Binge eating disorder: Secondary | ICD-10-CM

## 2020-12-18 DIAGNOSIS — R632 Polyphagia: Secondary | ICD-10-CM

## 2020-12-18 DIAGNOSIS — Z9189 Other specified personal risk factors, not elsewhere classified: Secondary | ICD-10-CM | POA: Diagnosis not present

## 2020-12-18 DIAGNOSIS — G478 Other sleep disorders: Secondary | ICD-10-CM | POA: Diagnosis not present

## 2020-12-18 DIAGNOSIS — Z6841 Body Mass Index (BMI) 40.0 and over, adult: Secondary | ICD-10-CM

## 2020-12-18 MED ORDER — WEGOVY 2.4 MG/0.75ML ~~LOC~~ SOAJ: 2.4000 mg | SUBCUTANEOUS | 0 refills | Status: DC

## 2020-12-18 MED ORDER — VYVANSE 60 MG PO CHEW: 60.0000 mg | CHEWABLE_TABLET | Freq: Every day | ORAL | 0 refills | Status: DC

## 2020-12-18 NOTE — Progress Notes (Signed)
Chief Complaint:   OBESITY Katie Glass is here to discuss her progress with her obesity treatment plan along with follow-up of her obesity related diagnoses.   Today's visit was #: 25 Starting weight: 268 lbs Starting date: 09/21/2019 Today's weight: 224 lbs Today's date: 12/18/2020 Weight change since last visit: 0 Total lbs lost to date: 44 lbs Body mass index is 36.15 kg/m.  Total weight loss percentage to date: -16.42%  Current Meal Plan: the Category 4 Plan for 65% of the time.  Current Exercise Plan: Strength training/walking/HIIT for 90-120 minutes 5-6 times per week. Current Anti-Obesity Medications: Wegovy 1.7 mg subcutaneously weekly. Side effects: None.  Interim History:  Katie Glass has started weight training.  She says there has been an increase in her stressful schedule.  Assessment/Plan:   1. Polyphagia Controlled. Current treatment: Wegovy 1.7 mg subcutaneously weekly. She will continue to focus on protein-rich, low simple carbohydrate foods. We reviewed the importance of hydration, regular exercise for stress reduction, and restorative sleep.  Plan:  Increase Wegovy to 2.4 mg subcutaneously weekly, as per below.  - Increase and refill Semaglutide-Weight Management (WEGOVY) 2.4 MG/0.75ML SOAJ; Inject 2.4 mg into the skin once a week.  Dispense: 3 mL; Refill: 0  2. Unhealthy sleep habit Katie Glass has developed some unhealthy sleep habits.  Plan: Recommend sleep hygiene measures including regular sleep schedule, optimal sleep environment, and relaxing presleep rituals.   3. Binge eating disorder Katie Glass is taking Vyvanse 60 mg daily for BED.   The current medical regimen is effective;  continue present plan and medications.  I have consulted the [place] Controlled Substances Registry for this patient, and feel the risk/benefit ratio today is favorable for proceeding with this prescription for a controlled substance. The patient understands monitoring parameters and red  flags.   - Refill Lisdexamfetamine Dimesylate (VYVANSE) 60 MG CHEW; Chew 60 mg by mouth daily.  Dispense: 30 tablet; Refill: 0  4. At risk for heart disease Due to Katie Glass's current state of health and medical condition(s), she is at a higher risk for heart disease.  This puts the patient at much greater risk to subsequently develop cardiopulmonary conditions that can significantly affect patient's quality of life in a negative manner.    At least 8 minutes were spent on counseling Katie Glass about these concerns today. Evidence-based interventions for health behavior change were utilized today including the discussion of self monitoring techniques, problem-solving barriers, and SMART goal setting techniques.  Specifically, regarding patient's less desirable eating habits and patterns, we employed the technique of small changes when Katie Glass has not been able to fully commit to her prudent nutritional plan.  5. Obesity, current BMI 36.3  Course: Katie Glass is currently in the action stage of change. As such, her goal is to continue with weight loss efforts.   Nutrition goals: She has agreed to the Category 4 Plan.   Exercise goals:  As is.  Behavioral modification strategies: increasing lean protein intake, decreasing simple carbohydrates, increasing vegetables, and increasing water intake.  Katie Glass has agreed to follow-up with our clinic in 4 weeks. She was informed of the importance of frequent follow-up visits to maximize her success with intensive lifestyle modifications for her multiple health conditions.   Objective:   Blood pressure (!) 145/89, pulse 73, temperature 97.8 F (36.6 C), temperature source Oral, height 5\' 6"  (1.676 m), weight 224 lb (101.6 kg), SpO2 99 %, unknown if currently breastfeeding. Body mass index is 36.15 kg/m.  General: Cooperative, alert, well developed, in  no acute distress. HEENT: Conjunctivae and lids unremarkable. Cardiovascular: Regular rhythm.  Lungs:  Normal work of breathing. Neurologic: No focal deficits.   Lab Results  Component Value Date   CREATININE 0.94 06/28/2020   BUN 16 06/28/2020   NA 139 06/28/2020   K 4.3 06/28/2020   CL 103 06/28/2020   CO2 21 06/28/2020   Lab Results  Component Value Date   ALT 11 06/28/2020   AST 14 06/28/2020   ALKPHOS 44 06/28/2020   BILITOT 0.6 06/28/2020   Lab Results  Component Value Date   HGBA1C 5.4 06/28/2020   HGBA1C 5.2 09/21/2019   HGBA1C 5.4 05/17/2019   Lab Results  Component Value Date   INSULIN 4.0 06/28/2020   INSULIN 7.6 09/21/2019   INSULIN 13.2 05/17/2019   Lab Results  Component Value Date   TSH 1.350 05/17/2019   Lab Results  Component Value Date   CHOL 155 06/28/2020   HDL 62 06/28/2020   LDLCALC 81 06/28/2020   TRIG 57 06/28/2020   CHOLHDL 3.3 05/17/2019   Lab Results  Component Value Date   VD25OH 40.6 06/28/2020   VD25OH 25.0 (L) 09/21/2019   VD25OH 22.9 (L) 05/17/2019   Lab Results  Component Value Date   WBC 6.7 06/28/2020   HGB 14.9 06/28/2020   HCT 44.2 06/28/2020   MCV 93 06/28/2020   PLT 209 06/28/2020   Lab Results  Component Value Date   IRON 93 09/21/2019   TIBC 299 09/21/2019   FERRITIN 51 09/21/2019   Attestation Statements:   Reviewed by clinician on day of visit: allergies, medications, problem list, medical history, surgical history, family history, social history, and previous encounter notes.  I, Insurance claims handler, CMA, am acting as transcriptionist for Helane Rima, DO  I have reviewed the above documentation for accuracy and completeness, and I agree with the above. Helane Rima, DO

## 2021-01-13 ENCOUNTER — Other Ambulatory Visit (INDEPENDENT_AMBULATORY_CARE_PROVIDER_SITE_OTHER): Payer: Self-pay | Admitting: Family Medicine

## 2021-01-13 DIAGNOSIS — R632 Polyphagia: Secondary | ICD-10-CM

## 2021-01-13 DIAGNOSIS — E559 Vitamin D deficiency, unspecified: Secondary | ICD-10-CM

## 2021-01-14 NOTE — Telephone Encounter (Signed)
Last seen by Dr. Wallace. 

## 2021-01-15 ENCOUNTER — Encounter (INDEPENDENT_AMBULATORY_CARE_PROVIDER_SITE_OTHER): Payer: Self-pay | Admitting: Family Medicine

## 2021-01-15 ENCOUNTER — Ambulatory Visit (INDEPENDENT_AMBULATORY_CARE_PROVIDER_SITE_OTHER): Payer: BC Managed Care – PPO | Admitting: Family Medicine

## 2021-01-15 ENCOUNTER — Other Ambulatory Visit: Payer: Self-pay

## 2021-01-15 VITALS — BP 138/88 | HR 71 | Temp 98.0°F | Ht 66.0 in | Wt 223.0 lb

## 2021-01-15 DIAGNOSIS — F5081 Binge eating disorder: Secondary | ICD-10-CM | POA: Diagnosis not present

## 2021-01-15 DIAGNOSIS — R632 Polyphagia: Secondary | ICD-10-CM | POA: Diagnosis not present

## 2021-01-15 DIAGNOSIS — Z6841 Body Mass Index (BMI) 40.0 and over, adult: Secondary | ICD-10-CM

## 2021-01-15 DIAGNOSIS — E559 Vitamin D deficiency, unspecified: Secondary | ICD-10-CM

## 2021-01-15 MED ORDER — VITAMIN D (ERGOCALCIFEROL) 1.25 MG (50000 UNIT) PO CAPS: 50000.0000 [IU] | ORAL_CAPSULE | ORAL | 0 refills | Status: DC

## 2021-01-15 MED ORDER — VYVANSE 60 MG PO CHEW: 60.0000 mg | CHEWABLE_TABLET | Freq: Every day | ORAL | 0 refills | Status: DC

## 2021-01-15 MED ORDER — WEGOVY 2.4 MG/0.75ML ~~LOC~~ SOAJ: 2.4000 mg | SUBCUTANEOUS | 0 refills | Status: DC

## 2021-01-17 NOTE — Progress Notes (Signed)
Chief Complaint:   OBESITY Katie Glass is here to discuss her progress with her obesity treatment plan along with follow-up of her obesity related diagnoses. See Medical Weight Management Flowsheet for complete bioelectrical impedance results.  Today's visit was #: 26 Starting weight: 268 lbs Starting date: 05/17/2019 Weight change since last visit: 1 lb Total lbs lost to date: 45 lbs Total weight loss percentage to date: -16.79%  Nutrition Plan: Category 4 Plan 40% of the time. Activity: Strength training/cardio/walking/FiA for 90 minutes 6 times per week.  Anti-obesity medications: Wegovy 2.4 mg subcutaneously weekly. Reported side effects: None.  Interim History: Katie Glass says she is working on having less meetings.  Assessment/Plan:   1. Polyphagia Controlled. Current treatment: Wegovy 2.4 mg subcutaneously weekly. She will continue to focus on protein-rich, low simple carbohydrate foods. We reviewed the importance of hydration, regular exercise for stress reduction, and restorative sleep.  Plan:  Refill Wegovy 2.4 mg subcutaneously weekly, as per below.  - Refill Semaglutide-Weight Management (WEGOVY) 2.4 MG/0.75ML SOAJ; Inject 2.4 mg into the skin once a week.  Dispense: 3 mL; Refill: 0  2. Vitamin D deficiency Improving, but not optimized. She is taking vitamin D 50,000 IU weekly.  Plan: Continue to take prescription Vitamin D @50 ,000 IU every week as prescribed.  Follow-up for routine testing of Vitamin D, at least 2-3 times per year to avoid over-replacement.  Lab Results  Component Value Date   VD25OH 40.6 06/28/2020   VD25OH 25.0 (L) 09/21/2019   VD25OH 22.9 (L) 05/17/2019   - Refill Vitamin D, Ergocalciferol, (DRISDOL) 1.25 MG (50000 UNIT) CAPS capsule; Take 1 capsule (50,000 Units total) by mouth every 7 (seven) days.  Dispense: 4 capsule; Refill: 0  3. Binge eating disorder Katie Glass is taking Vyvanse 60 mg daily for BED.   The current medical regimen is  effective;  continue present plan and medications.  Will refill Vyvanse 60 mg daily today, as per below.  - Refill Lisdexamfetamine Dimesylate (VYVANSE) 60 MG CHEW; Chew 60 mg by mouth daily.  Dispense: 30 tablet; Refill: 0  I have consulted the [place] Controlled Substances Registry for this patient, and feel the risk/benefit ratio today is favorable for proceeding with this prescription for a controlled substance. The patient understands monitoring parameters and red flags.   4. Obesity, current BMI 36  Course: Katie Glass is currently in the action stage of change. As such, her goal is to continue with weight loss efforts.   Nutrition goals: She has agreed to the Category 4 Plan.   Exercise goals:  As is.  Behavioral modification strategies: increasing lean protein intake, decreasing simple carbohydrates, increasing vegetables, increasing water intake, and decreasing liquid calories.  Katie Glass has agreed to follow-up with our clinic in 4 weeks. She was informed of the importance of frequent follow-up visits to maximize her success with intensive lifestyle modifications for her multiple health conditions.   Objective:   Blood pressure 138/88, pulse 71, temperature 98 F (36.7 C), temperature source Oral, height 5\' 6"  (1.676 m), weight 223 lb (101.2 kg), SpO2 99 %, unknown if currently breastfeeding. Body mass index is 35.99 kg/m.  General: Cooperative, alert, well developed, in no acute distress. HEENT: Conjunctivae and lids unremarkable. Cardiovascular: Regular rhythm.  Lungs: Normal work of breathing. Neurologic: No focal deficits.   Lab Results  Component Value Date   CREATININE 0.94 06/28/2020   BUN 16 06/28/2020   NA 139 06/28/2020   K 4.3 06/28/2020   CL 103 06/28/2020  CO2 21 06/28/2020   Lab Results  Component Value Date   ALT 11 06/28/2020   AST 14 06/28/2020   ALKPHOS 44 06/28/2020   BILITOT 0.6 06/28/2020   Lab Results  Component Value Date   HGBA1C 5.4 06/28/2020    HGBA1C 5.2 09/21/2019   HGBA1C 5.4 05/17/2019   Lab Results  Component Value Date   INSULIN 4.0 06/28/2020   INSULIN 7.6 09/21/2019   INSULIN 13.2 05/17/2019   Lab Results  Component Value Date   TSH 1.350 05/17/2019   Lab Results  Component Value Date   CHOL 155 06/28/2020   HDL 62 06/28/2020   LDLCALC 81 06/28/2020   TRIG 57 06/28/2020   CHOLHDL 3.3 05/17/2019   Lab Results  Component Value Date   VD25OH 40.6 06/28/2020   VD25OH 25.0 (L) 09/21/2019   VD25OH 22.9 (L) 05/17/2019   Lab Results  Component Value Date   WBC 6.7 06/28/2020   HGB 14.9 06/28/2020   HCT 44.2 06/28/2020   MCV 93 06/28/2020   PLT 209 06/28/2020   Lab Results  Component Value Date   IRON 93 09/21/2019   TIBC 299 09/21/2019   FERRITIN 51 09/21/2019   Attestation Statements:   Reviewed by clinician on day of visit: allergies, medications, problem list, medical history, surgical history, family history, social history, and previous encounter notes.  I, Insurance claims handler, CMA, am acting as transcriptionist for Helane Rima, DO  I have reviewed the above documentation for accuracy and completeness, and I agree with the above. -  Helane Rima, DO, MS, FAAFP, DABOM - Family and Bariatric Medicine.

## 2021-01-21 ENCOUNTER — Other Ambulatory Visit (INDEPENDENT_AMBULATORY_CARE_PROVIDER_SITE_OTHER): Payer: Self-pay | Admitting: Family Medicine

## 2021-01-21 MED ORDER — OSELTAMIVIR PHOSPHATE 75 MG PO CAPS: 75.0000 mg | ORAL_CAPSULE | Freq: Every day | ORAL | 0 refills | Status: DC

## 2021-01-22 ENCOUNTER — Other Ambulatory Visit (INDEPENDENT_AMBULATORY_CARE_PROVIDER_SITE_OTHER): Payer: Self-pay

## 2021-01-22 MED ORDER — OSELTAMIVIR PHOSPHATE 75 MG PO CAPS: 75.0000 mg | ORAL_CAPSULE | Freq: Every day | ORAL | 0 refills | Status: DC

## 2021-02-11 ENCOUNTER — Other Ambulatory Visit: Payer: Self-pay

## 2021-02-11 ENCOUNTER — Encounter (INDEPENDENT_AMBULATORY_CARE_PROVIDER_SITE_OTHER): Payer: Self-pay | Admitting: Family Medicine

## 2021-02-11 ENCOUNTER — Ambulatory Visit (INDEPENDENT_AMBULATORY_CARE_PROVIDER_SITE_OTHER): Payer: BC Managed Care – PPO | Admitting: Family Medicine

## 2021-02-11 ENCOUNTER — Encounter (INDEPENDENT_AMBULATORY_CARE_PROVIDER_SITE_OTHER): Payer: Self-pay

## 2021-02-11 ENCOUNTER — Telehealth (INDEPENDENT_AMBULATORY_CARE_PROVIDER_SITE_OTHER): Payer: Self-pay | Admitting: Family Medicine

## 2021-02-11 VITALS — BP 145/88 | HR 77 | Temp 97.6°F | Ht 66.0 in | Wt 220.0 lb

## 2021-02-11 DIAGNOSIS — E559 Vitamin D deficiency, unspecified: Secondary | ICD-10-CM

## 2021-02-11 DIAGNOSIS — R632 Polyphagia: Secondary | ICD-10-CM | POA: Diagnosis not present

## 2021-02-11 DIAGNOSIS — F5081 Binge eating disorder: Secondary | ICD-10-CM | POA: Diagnosis not present

## 2021-02-11 DIAGNOSIS — Z6841 Body Mass Index (BMI) 40.0 and over, adult: Secondary | ICD-10-CM

## 2021-02-11 MED ORDER — VYVANSE 60 MG PO CHEW: 60.0000 mg | CHEWABLE_TABLET | Freq: Every day | ORAL | 0 refills | Status: DC

## 2021-02-11 MED ORDER — VITAMIN D (ERGOCALCIFEROL) 1.25 MG (50000 UNIT) PO CAPS: 50000.0000 [IU] | ORAL_CAPSULE | ORAL | 0 refills | Status: DC

## 2021-02-11 MED ORDER — WEGOVY 2.4 MG/0.75ML ~~LOC~~ SOAJ: 2.4000 mg | SUBCUTANEOUS | 0 refills | Status: DC

## 2021-02-11 NOTE — Telephone Encounter (Signed)
Prior authorization approved for [facility]. Patient sent message in [place].

## 2021-02-11 NOTE — Progress Notes (Signed)
Chief Complaint:   OBESITY Katie Glass is here to discuss her progress with her obesity treatment plan along with follow-up of her obesity related diagnoses. See Medical Weight Management Flowsheet for complete bioelectrical impedance results.  Today's visit was #: 27 Starting weight: 268 lbs Starting date: 05/17/2019 Weight change since last visit: 3 lbs Total lbs lost to date: 48 lbs Total weight loss percentage to date: -17.91%  Nutrition Plan: Category 4 Plan for 60% of the time.  Activity: Cardio/strength training for 90-120 minutes 5-6 times per week.  Anti-obesity medications: Wegovy 2.4 mg subcutaneously weekly. Reported side effects: None.  Interim History: [name] is working with a Psychologist, educational.  She says she has decreased appetite.  She reports feeling better after having the flu.  Assessment/Plan:   1. Vitamin D deficiency Improving, but not optimized.  She is taking vitamin D 50,000 IU weekly.  Plan: Continue to take prescription Vitamin D @50 ,000 IU every week as prescribed.  Follow-up for routine testing of Vitamin D, at least 2-3 times per year to avoid over-replacement.  Lab Results  Component Value Date   VD25OH 40.6 06/28/2020   VD25OH 25.0 (L) 09/21/2019   VD25OH 22.9 (L) 05/17/2019   - Refill Vitamin D, Ergocalciferol, (DRISDOL) 1.25 MG (50000 UNIT) CAPS capsule; Take 1 capsule (50,000 Units total) by mouth every 7 (seven) days.  Dispense: 4 capsule; Refill: 0  2. Polyphagia Controlled. Current treatment: Wegovy 2.4 mg subcutaneously weekly.    Plan: Continue Wegovy 2.4 mg weekly.  Will refill today, as per below. She will continue to focus on protein-rich, low simple carbohydrate foods. We reviewed the importance of hydration, regular exercise for stress reduction, and restorative sleep.  - Refill Semaglutide-Weight Management (WEGOVY) 2.4 MG/0.75ML SOAJ; Inject 2.4 mg into the skin once a week.  Dispense: 3 mL; Refill: 0  3. Binge eating disorder Destine  is taking Vyvanse 60 mg daily for BED.   The current medical regimen is effective;  continue present plan and medications.  Will refill Vyvanse 60 mg daily today, as per below.  - Refill Lisdexamfetamine Dimesylate (VYVANSE) 60 MG CHEW; Chew 60 mg by mouth daily.  Dispense: 30 tablet; Refill: 0  I have consulted the [place] Controlled Substances Registry for this patient, and feel the risk/benefit ratio today is favorable for proceeding with this prescription for a controlled substance. The patient understands monitoring parameters and red flags.   4. Obesity, current BMI 35.6  Course: Katie Glass is currently in the action stage of change. As such, her goal is to continue with weight loss efforts.   Nutrition goals: She has agreed to the Category 4 Plan.   Exercise goals:  As is.  Behavioral modification strategies: increasing lean protein intake, decreasing simple carbohydrates, increasing vegetables, and increasing water intake.  [name] has agreed to follow-up with our clinic in 4 weeks. She was informed of the importance of frequent follow-up visits to maximize her success with intensive lifestyle modifications for her multiple health conditions.   Objective:   Blood pressure (!) 145/88, pulse 77, temperature 97.6 F (36.4 C), temperature source Oral, height 5\' 6"  (1.676 m), weight 220 lb (99.8 kg), SpO2 99 %, unknown if currently breastfeeding. Body mass index is 35.51 kg/m.  General: Cooperative, alert, well developed, in no acute distress. HEENT: Conjunctivae and lids unremarkable. Cardiovascular: Regular rhythm.  Lungs: Normal work of breathing. Neurologic: No focal deficits.   Lab Results  Component Value Date   CREATININE 0.94 06/28/2020   BUN 16  06/28/2020   NA 139 06/28/2020   K 4.3 06/28/2020   CL 103 06/28/2020   CO2 21 06/28/2020   Lab Results  Component Value Date   ALT 11 06/28/2020   AST 14 06/28/2020   ALKPHOS 44 06/28/2020   BILITOT 0.6 06/28/2020   Lab  Results  Component Value Date   HGBA1C 5.4 06/28/2020   HGBA1C 5.2 09/21/2019   HGBA1C 5.4 05/17/2019   Lab Results  Component Value Date   INSULIN 4.0 06/28/2020   INSULIN 7.6 09/21/2019   INSULIN 13.2 05/17/2019   Lab Results  Component Value Date   TSH 1.350 05/17/2019   Lab Results  Component Value Date   CHOL 155 06/28/2020   HDL 62 06/28/2020   LDLCALC 81 06/28/2020   TRIG 57 06/28/2020   CHOLHDL 3.3 05/17/2019   Lab Results  Component Value Date   VD25OH 40.6 06/28/2020   VD25OH 25.0 (L) 09/21/2019   VD25OH 22.9 (L) 05/17/2019   Lab Results  Component Value Date   WBC 6.7 06/28/2020   HGB 14.9 06/28/2020   HCT 44.2 06/28/2020   MCV 93 06/28/2020   PLT 209 06/28/2020   Lab Results  Component Value Date   IRON 93 09/21/2019   TIBC 299 09/21/2019   FERRITIN 51 09/21/2019   Attestation Statements:   Reviewed by clinician on day of visit: allergies, medications, problem list, medical history, surgical history, family history, social history, and previous encounter notes.  I, Insurance claims handler, CMA, am acting as transcriptionist for Helane Rima, DO  I have reviewed the above documentation for accuracy and completeness, and I agree with the above. -  Helane Rima, DO, MS, FAAFP, DABOM - Family and Bariatric Medicine.

## 2021-03-04 DIAGNOSIS — D225 Melanocytic nevi of trunk: Secondary | ICD-10-CM | POA: Diagnosis not present

## 2021-03-04 DIAGNOSIS — D234 Other benign neoplasm of skin of scalp and neck: Secondary | ICD-10-CM | POA: Diagnosis not present

## 2021-03-05 ENCOUNTER — Telehealth (INDEPENDENT_AMBULATORY_CARE_PROVIDER_SITE_OTHER): Payer: Self-pay

## 2021-03-05 DIAGNOSIS — G4733 Obstructive sleep apnea (adult) (pediatric): Secondary | ICD-10-CM | POA: Diagnosis not present

## 2021-03-05 DIAGNOSIS — J329 Chronic sinusitis, unspecified: Secondary | ICD-10-CM

## 2021-03-05 MED ORDER — AMOXICILLIN-POT CLAVULANATE 875-125 MG PO TABS: 1.0000 | ORAL_TABLET | Freq: Two times a day (BID) | ORAL | 0 refills | Status: DC

## 2021-03-05 MED ORDER — PREDNISONE 5 MG PO TABS
ORAL_TABLET | ORAL | 0 refills | Status: DC
Start: 2021-03-05 — End: 2021-03-12

## 2021-03-05 NOTE — Telephone Encounter (Signed)
RXs sent.

## 2021-03-05 NOTE — Telephone Encounter (Signed)
Pt called in and reported symptoms of a sinus infection. Congestion and waking up with eye crust for 1 week.  Please advise

## 2021-03-12 ENCOUNTER — Ambulatory Visit (INDEPENDENT_AMBULATORY_CARE_PROVIDER_SITE_OTHER): Payer: BC Managed Care – PPO | Admitting: Family Medicine

## 2021-03-12 ENCOUNTER — Other Ambulatory Visit: Payer: Self-pay

## 2021-03-12 ENCOUNTER — Encounter (INDEPENDENT_AMBULATORY_CARE_PROVIDER_SITE_OTHER): Payer: Self-pay | Admitting: Family Medicine

## 2021-03-12 VITALS — BP 141/92 | HR 70 | Temp 97.9°F | Ht 66.0 in | Wt 220.0 lb

## 2021-03-12 DIAGNOSIS — Z6841 Body Mass Index (BMI) 40.0 and over, adult: Secondary | ICD-10-CM

## 2021-03-12 DIAGNOSIS — E559 Vitamin D deficiency, unspecified: Secondary | ICD-10-CM

## 2021-03-12 DIAGNOSIS — F5081 Binge eating disorder: Secondary | ICD-10-CM

## 2021-03-12 DIAGNOSIS — R632 Polyphagia: Secondary | ICD-10-CM

## 2021-03-12 MED ORDER — VITAMIN D (ERGOCALCIFEROL) 1.25 MG (50000 UNIT) PO CAPS: 50000.0000 [IU] | ORAL_CAPSULE | ORAL | 0 refills | Status: DC

## 2021-03-12 MED ORDER — WEGOVY 2.4 MG/0.75ML ~~LOC~~ SOAJ: 2.4000 mg | SUBCUTANEOUS | 2 refills | Status: DC

## 2021-03-12 MED ORDER — VYVANSE 60 MG PO CHEW
60.0000 mg | CHEWABLE_TABLET | Freq: Every day | ORAL | 0 refills | Status: DC
Start: 2021-03-12 — End: 2021-03-26

## 2021-03-13 NOTE — Progress Notes (Signed)
Chief Complaint:   OBESITY Katie Glass is here to discuss her progress with her obesity treatment plan along with follow-up of her obesity related diagnoses. See Medical Weight Management Flowsheet for complete bioelectrical impedance results.  Today's visit was #: 28 Starting weight: 268 lbs Starting date: 05/17/2019 Weight change since last visit: 0 Total lbs lost to date: 48 lbs Total weight loss percentage to date: -17.91%  Nutrition Plan: Category 4 Plan for 50% of the time.  Activity: Cardio/strength/HIIT for 90-120 minutes 5-6 times per week.  Anti-obesity medications: Wegovy 2.4 mg subcutaneously weekly. Reported side effects: None.  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  She says she is still exercising.  Assessment/Plan:   1. Polyphagia Controlled. Current treatment: Wegovy 2.4 mg subcutaneously weekly.    Plan: Continue Wegovy 2.4 mg subcutaneously weekly.  She will continue to focus on protein-rich, low simple carbohydrate foods. We reviewed the importance of hydration, regular exercise for stress reduction, and restorative sleep.  - Refill Semaglutide-Weight Management (WEGOVY) 2.4 MG/0.75ML SOAJ; Inject 2.4 mg into the skin once a week.  Dispense: 3 mL; Refill: 2  2. Vitamin D deficiency Not at goal. She is taking vitamin D 50,000 IU weekly.  Plan: Continue to take prescription Vitamin D @50 ,000 IU every week as prescribed.  Follow-up for routine testing of Vitamin D, at least 2-3 times per year to avoid over-replacement.  Lab Results  Component Value Date   VD25OH 40.6 06/28/2020   VD25OH 25.0 (L) 09/21/2019   VD25OH 22.9 (L) 05/17/2019   - Refill Vitamin D, Ergocalciferol, (DRISDOL) 1.25 MG (50000 UNIT) CAPS capsule; Take 1 capsule (50,000 Units total) by mouth every 7 (seven) days.  Dispense: 4 capsule; Refill: 0  3. Binge eating disorder Katie Glass is taking Vyvanse 60 mg daily  for BED.   The current medical regimen is effective;  continue present plan and medications.  Will refill Vyvanse 60 mg daily today, as per below.  The goals for treatment of BED are to reduce eating binges and to achieve healthy eating habits. Because binge eating can correlate with negative emotions, treatment may also address any other mental-health issues, such as depression. People who binge eat feel as if they don't have control over how much they eat and have feelings of guilt or self-loathing after a binge eating episode.  The FDA has approved Vyvanse as a treatment option for binge eating disorder. Vyvanse targets the brain's reward center by increasing the levels of dopamine and norepinephrine, the chemicals of the brain responsible for feelings of pleasure.  Mindful eating is the recommended nutritional approach to treating BED.   - Refill Lisdexamfetamine Dimesylate (VYVANSE) 60 MG CHEW; Chew 60 mg by mouth daily.  Dispense: 30 tablet; Refill: 0  I have consulted the [place] Controlled Substances Registry for this patient, and feel the risk/benefit ratio today is favorable for proceeding with this prescription for a controlled substance. The patient understands monitoring parameters and red flags.   4. Obesity, current BMI 35.6  Course: Katie Glass is currently in the action stage of change. As such, her goal is to continue with weight loss efforts.   Nutrition goals: She has agreed to the Category 4 Plan.   Exercise goals:  As is.  Behavioral modification strategies: increasing lean protein intake, decreasing simple carbohydrates, increasing vegetables, and increasing water intake.  [name] has agreed to follow-up with our clinic in 4 weeks. She  was informed of the importance of frequent follow-up visits to maximize her success with intensive lifestyle modifications for her multiple health conditions.   Objective:   Blood pressure (!) 141/92, pulse 70, temperature 97.9 F (36.6 C),  temperature source Oral, height 5\' 6"  (1.676 m), weight 220 lb (99.8 kg), SpO2 98 %, unknown if currently breastfeeding. Body mass index is 35.51 kg/m.  General: Cooperative, alert, well developed, in no acute distress. HEENT: Conjunctivae and lids unremarkable. Cardiovascular: Regular rhythm.  Lungs: Normal work of breathing. Neurologic: No focal deficits.   Lab Results  Component Value Date   CREATININE 0.94 06/28/2020   BUN 16 06/28/2020   NA 139 06/28/2020   K 4.3 06/28/2020   CL 103 06/28/2020   CO2 21 06/28/2020   Lab Results  Component Value Date   ALT 11 06/28/2020   AST 14 06/28/2020   ALKPHOS 44 06/28/2020   BILITOT 0.6 06/28/2020   Lab Results  Component Value Date   HGBA1C 5.4 06/28/2020   HGBA1C 5.2 09/21/2019   HGBA1C 5.4 05/17/2019   Lab Results  Component Value Date   INSULIN 4.0 06/28/2020   INSULIN 7.6 09/21/2019   INSULIN 13.2 05/17/2019   Lab Results  Component Value Date   TSH 1.350 05/17/2019   Lab Results  Component Value Date   CHOL 155 06/28/2020   HDL 62 06/28/2020   LDLCALC 81 06/28/2020   TRIG 57 06/28/2020   CHOLHDL 3.3 05/17/2019   Lab Results  Component Value Date   VD25OH 40.6 06/28/2020   VD25OH 25.0 (L) 09/21/2019   VD25OH 22.9 (L) 05/17/2019   Lab Results  Component Value Date   WBC 6.7 06/28/2020   HGB 14.9 06/28/2020   HCT 44.2 06/28/2020   MCV 93 06/28/2020   PLT 209 06/28/2020   Lab Results  Component Value Date   IRON 93 09/21/2019   TIBC 299 09/21/2019   FERRITIN 51 09/21/2019   Attestation Statements:   Reviewed by clinician on day of visit: allergies, medications, problem list, medical history, surgical history, family history, social history, and previous encounter notes.  I, 09/23/2019, CMA, am acting as transcriptionist for Insurance claims handler, DO  I have reviewed the above documentation for accuracy and completeness, and I agree with the above. -  Helane Rima, DO, MS, FAAFP, DABOM - Family and  Bariatric Medicine.

## 2021-03-26 ENCOUNTER — Encounter (INDEPENDENT_AMBULATORY_CARE_PROVIDER_SITE_OTHER): Payer: Self-pay | Admitting: Family Medicine

## 2021-03-26 DIAGNOSIS — F5081 Binge eating disorder: Secondary | ICD-10-CM

## 2021-03-26 MED ORDER — VYVANSE 60 MG PO CHEW: 60.0000 mg | CHEWABLE_TABLET | Freq: Every day | ORAL | 0 refills | Status: DC

## 2021-03-26 NOTE — Telephone Encounter (Signed)
Dr.Wallace °

## 2021-03-28 ENCOUNTER — Telehealth (INDEPENDENT_AMBULATORY_CARE_PROVIDER_SITE_OTHER): Payer: Self-pay

## 2021-03-28 NOTE — Telephone Encounter (Signed)
Status:Approved Coverage Start Date:02/26/2021 Coverage End Date:03/28/2022

## 2021-04-09 ENCOUNTER — Ambulatory Visit (INDEPENDENT_AMBULATORY_CARE_PROVIDER_SITE_OTHER): Payer: BC Managed Care – PPO | Admitting: Family Medicine

## 2021-04-09 ENCOUNTER — Other Ambulatory Visit: Payer: Self-pay

## 2021-04-09 ENCOUNTER — Encounter (INDEPENDENT_AMBULATORY_CARE_PROVIDER_SITE_OTHER): Payer: Self-pay | Admitting: Family Medicine

## 2021-04-09 VITALS — BP 139/90 | HR 80 | Temp 97.9°F | Ht 66.0 in | Wt 218.0 lb

## 2021-04-09 DIAGNOSIS — E559 Vitamin D deficiency, unspecified: Secondary | ICD-10-CM | POA: Diagnosis not present

## 2021-04-09 DIAGNOSIS — Z6835 Body mass index (BMI) 35.0-35.9, adult: Secondary | ICD-10-CM

## 2021-04-09 DIAGNOSIS — R600 Localized edema: Secondary | ICD-10-CM

## 2021-04-09 DIAGNOSIS — G4733 Obstructive sleep apnea (adult) (pediatric): Secondary | ICD-10-CM

## 2021-04-09 DIAGNOSIS — F5081 Binge eating disorder: Secondary | ICD-10-CM

## 2021-04-09 DIAGNOSIS — R632 Polyphagia: Secondary | ICD-10-CM

## 2021-04-09 DIAGNOSIS — E669 Obesity, unspecified: Secondary | ICD-10-CM

## 2021-04-09 MED ORDER — WEGOVY 2.4 MG/0.75ML ~~LOC~~ SOAJ: 2.4000 mg | SUBCUTANEOUS | 2 refills | Status: DC

## 2021-04-09 MED ORDER — VITAMIN D (ERGOCALCIFEROL) 1.25 MG (50000 UNIT) PO CAPS: 50000.0000 [IU] | ORAL_CAPSULE | ORAL | 0 refills | Status: DC

## 2021-04-09 MED ORDER — VYVANSE 60 MG PO CHEW: 60.0000 mg | CHEWABLE_TABLET | Freq: Every day | ORAL | 0 refills | Status: DC

## 2021-04-09 MED ORDER — SPIRONOLACTONE 50 MG PO TABS: 50.0000 mg | ORAL_TABLET | Freq: Every day | ORAL | 0 refills | Status: DC

## 2021-04-09 NOTE — Progress Notes (Signed)
Chief Complaint:   OBESITY Katie Glass is here to discuss her progress with her obesity treatment plan along with follow-up of her obesity related diagnoses. See Medical Weight Management Flowsheet for complete bioelectrical impedance results.  Today's visit was #: 29 Starting weight: 268 lbs Starting date: 05/17/2019 Weight change since last visit: 2 lbs Total lbs lost to date: 50 lbs Total weight loss percentage to date: -18.66%  Nutrition Plan: Category 4 Plan for 40% of the time.  Activity: Cardio, weights, going to the gym for 60-90 minutes 5-6 times per week.  Anti-obesity medications: Wegovy 2.4 mg subcutaneously weekly. Reported side effects: None.  Interim History: Katie Glass says she donated 6 bags of clothes and is down to a size large!  Assessment/Plan:   1. Lower extremity edema Start spironolactone 50 mg daily for lower extremity edema.  2. OSA (obstructive sleep apnea) She is wearing her CPAP.  Usually sleeps 6-6.5 hours per night.  OSA is a cause of systemic hypertension and is associated with an increased incidence of stroke, heart failure, atrial fibrillation, and coronary heart disease. Severe OSA increases all-cause mortality and cardiovascular mortality.   Goal: Treatment of OSA via CPAP compliance and weight loss. Plasma ghrelin levels (appetite or "hunger hormone") are significantly higher in OSA patients than in BMI-matched controls, but decrease to levels similar to those of obese patients without OSA after CPAP treatment.  Weight loss improves OSA by several mechanisms, including reduction in fatty tissue in the throat (i.e. parapharyngeal fat) and the tongue. Loss of abdominal fat increases mediastinal traction on the upper airway making it less likely to collapse during sleep. Studies have also shown that compliance with CPAP treatment improves leptin (hunger inhibitory hormone) imbalance.  3. Vitamin D deficiency Improving, but not optimized. She is  taking vitamin D 50,000 IU weekly.  Plan: Continue to take prescription Vitamin D @50 ,000 IU every week as prescribed.  Follow-up for routine testing of Vitamin D, at least 2-3 times per year to avoid over-replacement.  Lab Results  Component Value Date   VD25OH 40.6 06/28/2020   VD25OH 25.0 (L) 09/21/2019   VD25OH 22.9 (L) 05/17/2019   - Refill Vitamin D, Ergocalciferol, (DRISDOL) 1.25 MG (50000 UNIT) CAPS capsule; Take 1 capsule (50,000 Units total) by mouth every 7 (seven) days.  Dispense: 4 capsule; Refill: 0  4. Polyphagia Controlled. Current treatment: Wegovy 2.4 mg subcutaneously weekly.    Plan:  Continue Wegovy 2.4 mg subcutaneously weekly.  Will refill today. She will continue to focus on protein-rich, low simple carbohydrate foods. We reviewed the importance of hydration, regular exercise for stress reduction, and restorative sleep.  - Refill Semaglutide-Weight Management (WEGOVY) 2.4 MG/0.75ML SOAJ; Inject 2.4 mg into the skin once a week.  Dispense: 3 mL; Refill: 2  5. Binge eating disorder Katie Glass is taking Vyvanse 60 mg daily for BED.   The current medical regimen is effective;  continue present plan and medications.  Will refill Vyvanse 60 mg daily today, as per below.  The goals for treatment of BED are to reduce eating binges and to achieve healthy eating habits. Because binge eating can correlate with negative emotions, treatment may also address any other mental-health issues, such as depression.  People who binge eat feel as if they don't have control over how much they eat and have feelings of guilt or self-loathing after a binge eating episode.  The FDA has approved Vyvanse as a treatment option for binge eating disorder. Vyvanse targets the brain's reward  center by increasing the levels of dopamine and norepinephrine, the chemicals of the brain responsible for feelings of pleasure.  Mindful eating is the recommended nutritional approach to treating BED.   -  Refill Lisdexamfetamine Dimesylate (VYVANSE) 60 MG CHEW; Chew 60 mg by mouth daily.  Dispense: 30 tablet; Refill: 0  I have consulted the [place] Controlled Substances Registry for this patient, and feel the risk/benefit ratio today is favorable for proceeding with this prescription for a controlled substance. The patient understands monitoring parameters and red flags.   6. Obesity, current BMI 35.3  Course: Katie Glass is currently in the action stage of change. As such, her goal is to continue with weight loss efforts.   Nutrition goals: She has agreed to the Category 4 Plan.   Exercise goals:  As is.  Behavioral modification strategies: increasing lean protein intake, decreasing simple carbohydrates, increasing vegetables, and increasing water intake.  Katie Glass has agreed to follow-up with our clinic in 4 weeks. She was informed of the importance of frequent follow-up visits to maximize her success with intensive lifestyle modifications for her multiple health conditions.   Objective:   Blood pressure 139/90, pulse 80, temperature 97.9 F (36.6 C), height 5\' 6"  (1.676 m), weight 218 lb (98.9 kg), SpO2 100 %, unknown if currently breastfeeding. Body mass index is 35.19 kg/m.  General: Cooperative, alert, well developed, in no acute distress. HEENT: Conjunctivae and lids unremarkable. Cardiovascular: Regular rhythm.  Lungs: Normal work of breathing. Neurologic: No focal deficits.   Lab Results  Component Value Date   CREATININE 0.94 06/28/2020   BUN 16 06/28/2020   NA 139 06/28/2020   K 4.3 06/28/2020   CL 103 06/28/2020   CO2 21 06/28/2020   Lab Results  Component Value Date   ALT 11 06/28/2020   AST 14 06/28/2020   ALKPHOS 44 06/28/2020   BILITOT 0.6 06/28/2020   Lab Results  Component Value Date   HGBA1C 5.4 06/28/2020   HGBA1C 5.2 09/21/2019   HGBA1C 5.4 05/17/2019   Lab Results  Component Value Date   INSULIN 4.0 06/28/2020   INSULIN 7.6 09/21/2019   INSULIN 13.2  05/17/2019   Lab Results  Component Value Date   TSH 1.350 05/17/2019   Lab Results  Component Value Date   CHOL 155 06/28/2020   HDL 62 06/28/2020   LDLCALC 81 06/28/2020   TRIG 57 06/28/2020   CHOLHDL 3.3 05/17/2019   Lab Results  Component Value Date   VD25OH 40.6 06/28/2020   VD25OH 25.0 (L) 09/21/2019   VD25OH 22.9 (L) 05/17/2019   Lab Results  Component Value Date   WBC 6.7 06/28/2020   HGB 14.9 06/28/2020   HCT 44.2 06/28/2020   MCV 93 06/28/2020   PLT 209 06/28/2020   Lab Results  Component Value Date   IRON 93 09/21/2019   TIBC 299 09/21/2019   FERRITIN 51 09/21/2019   Attestation Statements:   Reviewed by clinician on day of visit: allergies, medications, problem list, medical history, surgical history, family history, social history, and previous encounter notes.  I, 09/23/2019, CMA, am acting as transcriptionist for Insurance claims handler, DO  I have reviewed the above documentation for accuracy and completeness, and I agree with the above. -  Helane Rima, DO, MS, FAAFP, DABOM - Family and Bariatric Medicine.

## 2021-04-22 NOTE — Progress Notes (Signed)
[facility] [address] [place] Phone: [phone] Subjective:   [name], am serving as a scribe for Dr. Hulan Saas. This visit occurred during the SARS-CoV-2 public health emergency.  Safety protocols were in place, including screening questions prior to the visit, additional usage of staff PPE, and extensive cleaning of exam room while observing appropriate contact time as indicated for disinfecting solutions.   I'm seeing this patient by the request  of:  Antony Contras, MD  CC: Right foot, bilateral elbow pain  [id number]  Katie Glass is a 46 y.o. female coming in with complaint of R foot pain. Seen in March 2022 for 1st MTP arthritis. Patient states foot pain has been bothersome for about a week. Near 4th Metatarsal. Pain is inconsistent. Bilateral elbow pain. More left than right. More lateral side.   MRI 03/19/2020 R foot IMPRESSION: 1. Suspected small focal disruption of the medial capsular and abductor hallucis attachment to the medial sesamoid of the first digit. There is also suspected disruption of the plantar plate centrally favoring turf toe injury. 2. Suspected disruption and turf toe injury. 3. Mild degenerative findings along the head of the first metatarsal.       Past Medical History:  Diagnosis Date   Arthritis    knees   Asthma    as a child   Back pain    Food allergy    Tannin/sulfate in Wine and ciders, rash on face and hands   Joint pain    Lower extremity edema    MMT (medial meniscus tear)    right   Sleep apnea    CPAP nightly   Umbilical hernia    Past Surgical History:  Procedure Laterality Date   BILATERAL SALPINGECTOMY Bilateral 04/15/2018   Procedure: BILATERAL SALPINGECTOMY;  Surgeon: Bobbye Charleston, MD;  Location: [facility];  Service: Obstetrics;  Laterality: Bilateral;   CESAREAN SECTION  11/12/2010   Procedure: CESAREAN SECTION;  Surgeon: Marcial Pacas;  Location: [facility];  Service: Gynecology;  Laterality: N/A;   CESAREAN SECTION N/A 04/15/2018   Procedure: CESAREAN SECTION;  Surgeon: Bobbye Charleston, MD;  Location: [facility];  Service: Obstetrics;  Laterality: N/A;   KNEE ARTHROSCOPY Right 02/11/2017   Procedure: RIGHT KNEE ARTHROSCOPY WITH DEBRIDEMENT CHONDROMALACIA;  Surgeon: Frederik Pear, MD;  Location: [facility];  Service: Orthopedics;  Laterality: Right;   Social History   Socioeconomic History   Marital status: Married    Spouse name: Mazel Iida   Number of children: Not on file   Years of education: Not on file   Highest education level: Not on file  Occupational History   Occupation: Pastor  Tobacco Use   Smoking status: Never   Smokeless tobacco: Never  Vaping Use   Vaping Use: Never used  Substance and Sexual Activity   Alcohol use: Yes    Comment: social   Drug use: No   Sexual activity: Yes    Birth control/protection: None  Other Topics Concern   Not on file  Social History Narrative   Not on file   Social Determinants of Health   Financial Resource Strain: Not on file  Food Insecurity: Not on file  Transportation Needs: Not on file  Physical Activity: Not on file  Stress: Not on file  Social Connections: Not on file   No Known Allergies Family History  Problem Relation Age of Onset   Hypertension Father  Hyperlipidemia Father    Heart disease Father    Cancer Maternal Grandfather    Heart disease Paternal Grandfather    Pulmonary embolism Mother    Alcohol abuse Mother     Current Outpatient Medications (Endocrine & Metabolic):    levonorgestrel (MIRENA) 20 MCG/24HR IUD, 1 each by Intrauterine route once.  Current Outpatient Medications (Cardiovascular):    spironolactone (ALDACTONE) 50 MG tablet, Take 1 tablet (50 mg total) by mouth daily.  Current Outpatient Medications (Respiratory):    cetirizine (ZYRTEC) 10 MG tablet, Take 10 mg by mouth  daily.    Current Outpatient Medications (Other):    gabapentin (NEURONTIN) 100 MG capsule, Take 2 capsules (200 mg total) by mouth at bedtime.   Lisdexamfetamine Dimesylate (VYVANSE) 60 MG CHEW, Chew 60 mg by mouth daily.   Multiple Vitamins-Minerals (WOMENS MULTI VITAMIN & MINERAL PO), Take 1 tablet by mouth daily.   Semaglutide-Weight Management (WEGOVY) 2.4 MG/0.75ML SOAJ, Inject 2.4 mg into the skin once a week.   Vitamin D, Ergocalciferol, (DRISDOL) 1.25 MG (50000 UNIT) CAPS capsule, Take 1 capsule (50,000 Units total) by mouth every 7 (seven) days.   Reviewed prior external information including notes and imaging from  primary care provider As well as notes that were available from care everywhere and other healthcare systems.  Past medical history, social, surgical and family history all reviewed in electronic medical record.  No pertanent information unless stated regarding to the chief complaint.   Review of Systems:  No headache, visual changes, nausea, vomiting, diarrhea, constipation, dizziness, abdominal pain, skin rash, fevers, chills, night sweats, weight loss, swollen lymph nodes, body aches, joint swelling, chest pain, shortness of breath, mood changes. POSITIVE muscle aches  Objective  Blood pressure 132/80, pulse 99, height 5\' 6"  (1.676 m), weight 220 lb (99.8 kg), SpO2 98 %, unknown if currently breastfeeding.   General: No apparent distress alert and oriented x3 mood and affect normal, dressed appropriately.  HEENT: Pupils equal, extraocular movements intact  Respiratory: Patient's speak in full sentences and does not appear short of breath  Cardiovascular: No lower extremity edema, non tender, no erythema  Gait very mildly antalgic Exam does show the patient does have breakdown of the tendon.  Noted.  Mild positive squeeze test.  Pain over the intermetatarsal space at the 4/5 area  No significant swelling noted.  No significant inflammation noted of the joint  space itself.  Patient's left wrist mild positive Tinel's noted.  Patient's does have some mild pain with resisted wrist extension.  Limited muscular skeletal ultrasound was performed and interpreted by Hulan Saas, M  Limited ultrasound of patient's foot shows the patient does have hypoechoic changes within the intermetatarsal space at L4-5 that is consistent with a potential neuroma.  Also have a small 1 between 4 and 3.  No cortical irregularity noted of the bone.  Very mild overlying hypoechoic changes of the fourth metatarsal that seems to be more within the tendon sheath. Impression: Questionable early tendinitis and neuroma     Impression and Recommendations:    The above documentation has been reviewed and is accurate and complete Lyndal Pulley, DO

## 2021-04-23 ENCOUNTER — Ambulatory Visit: Payer: Self-pay

## 2021-04-23 ENCOUNTER — Ambulatory Visit: Payer: BC Managed Care – PPO | Admitting: Family Medicine

## 2021-04-23 ENCOUNTER — Other Ambulatory Visit: Payer: Self-pay

## 2021-04-23 ENCOUNTER — Encounter: Payer: Self-pay | Admitting: Family Medicine

## 2021-04-23 VITALS — BP 132/80 | HR 99 | Ht 66.0 in | Wt 220.0 lb

## 2021-04-23 DIAGNOSIS — S93622A Sprain of tarsometatarsal ligament of left foot, initial encounter: Secondary | ICD-10-CM

## 2021-04-23 DIAGNOSIS — G5602 Carpal tunnel syndrome, left upper limb: Secondary | ICD-10-CM | POA: Insufficient documentation

## 2021-04-23 DIAGNOSIS — M79671 Pain in right foot: Secondary | ICD-10-CM | POA: Diagnosis not present

## 2021-04-23 MED ORDER — GABAPENTIN 100 MG PO CAPS: 200.0000 mg | ORAL_CAPSULE | Freq: Every day | ORAL | 0 refills | Status: DC

## 2021-04-23 NOTE — Assessment & Plan Note (Signed)
Seems to be an early neuroma following.  Patient was given metatarsal pads and will adjust accordingly.  We may want to consider the possibility of changing the custom orthotics as well.  We discussed the potential for gabapentin and prescribed today.  We will see if this will be helpful for this as well as patient does have what appears to be a small enlargement of the median nerve that is consistent with carpal tunnel syndrome.  Increase activity otherwise slowly.  Follow-up with me again in 4 weeks

## 2021-04-23 NOTE — Assessment & Plan Note (Signed)
We discussed the anatomy involved, and that carpal tunnel syndrome primarily involves the median nerve, and this typically affects digits one through 3.   We also discussed that mild cases of carpal tunnel syndrome are often improved with night splints.  If symptoms persist it is very reasonable to consider a carpal tunnel injection.   If the patient does have moderate to severe carpal tunnel syndrome based on NCV, then it is certainly reasonable to consider surgical consultation for definitive management possible carpal tunnel release.  We also discussed his severe carpal tunnel syndrome can lead to permanent nerve impairment even if released. At this point, the patient like to proceed conservatively. Follow-up with me again in 4 to 6 weeks

## 2021-04-23 NOTE — Patient Instructions (Signed)
Stretches Gabapentin 200mg  at night See me again in 5-6 weeks

## 2021-04-30 DIAGNOSIS — L578 Other skin changes due to chronic exposure to nonionizing radiation: Secondary | ICD-10-CM | POA: Diagnosis not present

## 2021-04-30 DIAGNOSIS — L821 Other seborrheic keratosis: Secondary | ICD-10-CM | POA: Diagnosis not present

## 2021-04-30 DIAGNOSIS — D1801 Hemangioma of skin and subcutaneous tissue: Secondary | ICD-10-CM | POA: Diagnosis not present

## 2021-05-07 ENCOUNTER — Encounter (INDEPENDENT_AMBULATORY_CARE_PROVIDER_SITE_OTHER): Payer: Self-pay | Admitting: Family Medicine

## 2021-05-07 ENCOUNTER — Other Ambulatory Visit: Payer: Self-pay

## 2021-05-07 ENCOUNTER — Ambulatory Visit (INDEPENDENT_AMBULATORY_CARE_PROVIDER_SITE_OTHER): Payer: BC Managed Care – PPO | Admitting: Family Medicine

## 2021-05-07 VITALS — BP 113/77 | HR 78 | Temp 98.0°F | Ht 66.0 in | Wt 218.0 lb

## 2021-05-07 DIAGNOSIS — Z6835 Body mass index (BMI) 35.0-35.9, adult: Secondary | ICD-10-CM

## 2021-05-07 DIAGNOSIS — F5081 Binge eating disorder: Secondary | ICD-10-CM | POA: Diagnosis not present

## 2021-05-07 DIAGNOSIS — R600 Localized edema: Secondary | ICD-10-CM

## 2021-05-07 DIAGNOSIS — E669 Obesity, unspecified: Secondary | ICD-10-CM

## 2021-05-07 DIAGNOSIS — E559 Vitamin D deficiency, unspecified: Secondary | ICD-10-CM

## 2021-05-07 DIAGNOSIS — R632 Polyphagia: Secondary | ICD-10-CM

## 2021-05-07 MED ORDER — VITAMIN D (ERGOCALCIFEROL) 1.25 MG (50000 UNIT) PO CAPS: 50000.0000 [IU] | ORAL_CAPSULE | ORAL | 0 refills | Status: DC

## 2021-05-07 MED ORDER — VYVANSE 60 MG PO CHEW: 60.0000 mg | CHEWABLE_TABLET | ORAL | 0 refills | Status: DC

## 2021-05-07 MED ORDER — WEGOVY 2.4 MG/0.75ML ~~LOC~~ SOAJ: 2.4000 mg | SUBCUTANEOUS | 2 refills | Status: DC

## 2021-05-07 MED ORDER — SPIRONOLACTONE 50 MG PO TABS: 50.0000 mg | ORAL_TABLET | Freq: Every day | ORAL | 0 refills | Status: DC

## 2021-05-07 MED ORDER — VYVANSE 60 MG PO CHEW: 60.0000 mg | CHEWABLE_TABLET | Freq: Every day | ORAL | 0 refills | Status: DC

## 2021-05-07 MED ORDER — SPIRONOLACTONE 50 MG PO TABS: 50.0000 mg | ORAL_TABLET | Freq: Every day | ORAL | 2 refills | Status: DC

## 2021-05-07 NOTE — Progress Notes (Signed)
Chief Complaint:   OBESITY Katie Glass is here to discuss her progress with her obesity treatment plan along with follow-up of her obesity related diagnoses. See Medical Weight Management Flowsheet for complete bioelectrical impedance results.  Today's visit was #: 30 Starting weight: 268 lbs Starting date: 05/17/2019 Weight change since last visit: 0 Total lbs lost to date: 50 Total weight loss percentage to date: -18.66%  Nutrition Plan: Category 4 Plan for 60% of the time.  Activity: Walking/HIIT/strength for 90 minutes 5-6 times per week. Anti-obesity medications: Wegovy 2.4 mg subcutaneously weekly. Reported side effects: None.  Assessment/Plan:   1. Polyphagia Controlled. Current treatment: Wegovy 2.4 mg subcutaneously weekly.    Plan: Continue Wegovy 2.4 mg subcutaneously weekly.  Will refill today, as per below.  She will continue to focus on protein-rich, low simple carbohydrate foods. We reviewed the importance of hydration, regular exercise for stress reduction, and restorative sleep.  - Refill Semaglutide-Weight Management (WEGOVY) 2.4 MG/0.75ML SOAJ; Inject 2.4 mg into the skin once a week.  Dispense: 3 mL; Refill: 2  2. Vitamin D deficiency Not at goal. She is taking vitamin D 50,000 IU weekly.  Plan: Continue to take prescription Vitamin D @50 ,000 IU every week as prescribed.  Follow-up for routine testing of Vitamin D, at least 2-3 times per year to avoid over-replacement.  Lab Results  Component Value Date   VD25OH 40.6 06/28/2020   VD25OH 25.0 (L) 09/21/2019   VD25OH 22.9 (L) 05/17/2019   - Refill Vitamin D, Ergocalciferol, (DRISDOL) 1.25 MG (50000 UNIT) CAPS capsule; Take 1 capsule (50,000 Units total) by mouth every 7 (seven) days.  Dispense: 4 capsule; Refill: 0  3. Lower extremity edema Continue spironolactone 50 mg daily for lower extremity edema.  - Refill spironolactone (ALDACTONE) 50 MG tablet; Take 1 tablet (50 mg total) by mouth daily.   Dispense: 90 tablet; Refill: 2  4. Binge eating disorder Jeanne is taking Vyvanse 60 mg daily for BED.   The current medical regimen is effective;  continue present plan and medications.  Will refill Vyvanse 60 mg daily today, as per below.  The goals for treatment of BED are to reduce eating binges and to achieve healthy eating habits. Because binge eating can correlate with negative emotions, treatment may also address any other mental-health issues, such as depression.  People who binge eat feel as if they don't have control over how much they eat and have feelings of guilt or self-loathing after a binge eating episode.  The FDA has approved Vyvanse as a treatment option for binge eating disorder. Vyvanse targets the brain's reward center by increasing the levels of dopamine and norepinephrine, the chemicals of the brain responsible for feelings of pleasure.  Mindful eating is the recommended nutritional approach to treating BED.   - Refill Lisdexamfetamine Dimesylate (VYVANSE) 60 MG CHEW; Chew 60 mg by mouth daily.  Dispense: 30 tablet; Refill: 0 - Lisdexamfetamine Dimesylate (VYVANSE) 60 MG CHEW; Chew 60 mg by mouth every morning.  Dispense: 30 tablet; Refill: 0 - Lisdexamfetamine Dimesylate (VYVANSE) 60 MG CHEW; Chew 60 mg by mouth every morning.  Dispense: 30 tablet; Refill: 0  I have consulted the [place] Controlled Substances Registry for this patient, and feel the risk/benefit ratio today is favorable for proceeding with this prescription for a controlled substance. The patient understands monitoring parameters and red flags.   5. Obesity, current BMI 35.2  Course: [name] is currently in the action stage of change. As such, her goal is  to continue with weight loss efforts.   Nutrition goals: She has agreed to the Category 4 Plan.   Exercise goals:  As is.  Behavioral modification strategies: increasing lean protein intake, decreasing simple carbohydrates, and increasing  vegetables.  Zayley has agreed to follow-up with our clinic in 4 weeks. She was informed of the importance of frequent follow-up visits to maximize her success with intensive lifestyle modifications for her multiple health conditions.   Objective:   Blood pressure 113/77, pulse 78, temperature 98 F (36.7 C), temperature source Oral, height 5\' 6"  (1.676 m), weight 218 lb (98.9 kg), SpO2 98 %, unknown if currently breastfeeding. Body mass index is 35.19 kg/m.  General: Cooperative, alert, well developed, in no acute distress. HEENT: Conjunctivae and lids unremarkable. Cardiovascular: Regular rhythm.  Lungs: Normal work of breathing. Neurologic: No focal deficits.   Lab Results  Component Value Date   CREATININE 0.94 06/28/2020   BUN 16 06/28/2020   NA 139 06/28/2020   K 4.3 06/28/2020   CL 103 06/28/2020   CO2 21 06/28/2020   Lab Results  Component Value Date   ALT 11 06/28/2020   AST 14 06/28/2020   ALKPHOS 44 06/28/2020   BILITOT 0.6 06/28/2020   Lab Results  Component Value Date   HGBA1C 5.4 06/28/2020   HGBA1C 5.2 09/21/2019   HGBA1C 5.4 05/17/2019   Lab Results  Component Value Date   INSULIN 4.0 06/28/2020   INSULIN 7.6 09/21/2019   INSULIN 13.2 05/17/2019   Lab Results  Component Value Date   TSH 1.350 05/17/2019   Lab Results  Component Value Date   CHOL 155 06/28/2020   HDL 62 06/28/2020   LDLCALC 81 06/28/2020   TRIG 57 06/28/2020   CHOLHDL 3.3 05/17/2019   Lab Results  Component Value Date   VD25OH 40.6 06/28/2020   VD25OH 25.0 (L) 09/21/2019   VD25OH 22.9 (L) 05/17/2019   Lab Results  Component Value Date   WBC 6.7 06/28/2020   HGB 14.9 06/28/2020   HCT 44.2 06/28/2020   MCV 93 06/28/2020   PLT 209 06/28/2020   Lab Results  Component Value Date   IRON 93 09/21/2019   TIBC 299 09/21/2019   FERRITIN 51 09/21/2019   Attestation Statements:   Reviewed by clinician on day of visit: allergies, medications, problem list, medical  history, surgical history, family history, social history, and previous encounter notes.  I, 09/23/2019, CMA, am acting as transcriptionist for Insurance claims handler, DO  I have reviewed the above documentation for accuracy and completeness, and I agree with the above. -  Helane Rima, DO, MS, FAAFP, DABOM - Family and Bariatric Medicine.

## 2021-05-10 DIAGNOSIS — G4733 Obstructive sleep apnea (adult) (pediatric): Secondary | ICD-10-CM | POA: Diagnosis not present

## 2021-05-14 ENCOUNTER — Other Ambulatory Visit (INDEPENDENT_AMBULATORY_CARE_PROVIDER_SITE_OTHER): Payer: Self-pay | Admitting: Family Medicine

## 2021-05-14 DIAGNOSIS — E559 Vitamin D deficiency, unspecified: Secondary | ICD-10-CM

## 2021-05-14 MED ORDER — VITAMIN D (ERGOCALCIFEROL) 1.25 MG (50000 UNIT) PO CAPS: 50000.0000 [IU] | ORAL_CAPSULE | ORAL | 0 refills | Status: DC

## 2021-05-23 NOTE — Progress Notes (Signed)
[name] [address] [place] [address] Phone: [phone] Subjective:   Katie Glass, am serving as a scribe for Dr. Antoine Primas.  This visit occurred during the SARS-CoV-2 public health emergency.  Safety protocols were in place, including screening questions prior to the visit, additional usage of staff PPE, and extensive cleaning of exam room while observing appropriate contact time as indicated for disinfecting solutions.    I'm seeing this patient by the request  of:  [name], MD  CC: bilateraL foot pain and elbow pain   [id number]  04/23/2021 We discussed the anatomy involved, and that carpal tunnel syndrome primarily involves the median nerve, and this typically affects digits one through 3.    We also discussed that mild cases of carpal tunnel syndrome are often improved with night splints.  If symptoms persist it is very reasonable to consider a carpal tunnel injection.    If the patient does have moderate to severe carpal tunnel syndrome based on NCV, then it is certainly reasonable to consider surgical consultation for definitive management possible carpal tunnel release.  We also discussed his severe carpal tunnel syndrome can lead to permanent nerve impairment even if released. At this point, the patient like to proceed conservatively. Follow-up with me again in 4 to 6 weeks  Seems to be an early neuroma following.  Patient was given metatarsal pads and will adjust accordingly.  We may want to consider the possibility of changing the custom orthotics as well.  We discussed the potential for gabapentin and prescribed today.  We will see if this will be helpful for this as well as patient does have what appears to be a small enlargement of the median nerve that is consistent with carpal tunnel syndrome.  Increase activity otherwise slowly.  Follow-up with me again in 4 weeks  Update 05/28/2021 Katie Glass is a 46 y.o.  female coming in with complaint of B foot and B elbow pain. Patient states that her pain occurs when she sits for prolonged periods but pain dissipates with activity. Pain increased the other day due to wearing ill fitting shoes.   L elbow is tender over lateral epicondyle. Has been mindful to grip underhand. Wearing brace at night.      Past Medical History:  Diagnosis Date   Arthritis    knees   Asthma    as a child   Back pain    Food allergy    Tannin/sulfate in [employer] and [employer], rash on face and hands   Joint pain    Lower extremity edema    MMT (medial meniscus tear)    right   Sleep apnea    CPAP nightly   Umbilical hernia    Past Surgical History:  Procedure Laterality Date   BILATERAL SALPINGECTOMY Bilateral 04/15/2018   Procedure: BILATERAL SALPINGECTOMY;  Surgeon: Carrington Clamp, MD;  Location: [facility];  Service: Obstetrics;  Laterality: Bilateral;   CESAREAN SECTION  11/12/2010   Procedure: CESAREAN SECTION;  Surgeon: Almon Hercules;  Location: [facility];  Service: Gynecology;  Laterality: N/A;   CESAREAN SECTION N/A 04/15/2018   Procedure: CESAREAN SECTION;  Surgeon: Carrington Clamp, MD;  Location: [facility];  Service: Obstetrics;  Laterality: N/A;   KNEE ARTHROSCOPY Right 02/11/2017   Procedure: RIGHT KNEE ARTHROSCOPY WITH DEBRIDEMENT CHONDROMALACIA;  Surgeon: Gean Birchwood, MD;  Location: [facility];  Service: Orthopedics;  Laterality: Right;   Social History  Socioeconomic History   Marital status: Married    Spouse name: Lakeyn Dokken   Number of children: Not on file   Years of education: Not on file   Highest education level: Not on file  Occupational History   Occupation: Education officer, environmental  Tobacco Use   Smoking status: Never   Smokeless tobacco: Never  Vaping Use   Vaping Use: Never used  Substance and Sexual Activity   Alcohol use: Yes    Comment: social   Drug use: No   Sexual activity: Yes    Birth  control/protection: None  Other Topics Concern   Not on file  Social History Narrative   Not on file   Social Determinants of Health   Financial Resource Strain: Not on file  Food Insecurity: Not on file  Transportation Needs: Not on file  Physical Activity: Not on file  Stress: Not on file  Social Connections: Not on file   No Known Allergies Family History  Problem Relation Age of Onset   Hypertension Father    Hyperlipidemia Father    Heart disease Father    Cancer Maternal Grandfather    Heart disease Paternal Grandfather    Pulmonary embolism Mother    Alcohol abuse Mother     Current Outpatient Medications (Endocrine & Metabolic):    levonorgestrel (MIRENA) 20 MCG/24HR IUD, 1 each by Intrauterine route once.  Current Outpatient Medications (Cardiovascular):    nitroGLYCERIN (NITRO-DUR) 0.2 mg/hr patch, Apply 1/4 of a patch to skin once daily.   spironolactone (ALDACTONE) 50 MG tablet, Take 1 tablet (50 mg total) by mouth daily.  Current Outpatient Medications (Respiratory):    cetirizine (ZYRTEC) 10 MG tablet, Take 10 mg by mouth daily.    Current Outpatient Medications (Other):    gabapentin (NEURONTIN) 100 MG capsule, Take 2 capsules (200 mg total) by mouth at bedtime.   Lisdexamfetamine Dimesylate (VYVANSE) 60 MG CHEW, Chew 60 mg by mouth daily.   Lisdexamfetamine Dimesylate (VYVANSE) 60 MG CHEW, Chew 60 mg by mouth every morning.   Multiple Vitamins-Minerals (WOMENS MULTI VITAMIN & MINERAL PO), Take 1 tablet by mouth daily.   Semaglutide-Weight Management (WEGOVY) 2.4 MG/0.75ML SOAJ, Inject 2.4 mg into the skin once a week.   Vitamin D, Ergocalciferol, (DRISDOL) 1.25 MG (50000 UNIT) CAPS capsule, Take 1 capsule (50,000 Units total) by mouth every 7 (seven) days.   Lisdexamfetamine Dimesylate (VYVANSE) 60 MG CHEW, Chew 60 mg by mouth every morning.   Reviewed prior external information including notes and imaging from  primary care provider As well as notes  that were available from care everywhere and other healthcare systems.  Past medical history, social, surgical and family history all reviewed in electronic medical record.  No pertanent information unless stated regarding to the chief complaint.   Review of Systems:  No headache, visual changes, nausea, vomiting, diarrhea, constipation, dizziness, abdominal pain, skin rash, fevers, chills, night sweats, weight loss, swollen lymph nodes, body aches, joint swelling, chest pain, shortness of breath, mood changes. POSITIVE muscle aches  Objective  Blood pressure 122/82, pulse 80, height 5\' 6"  (1.676 m), weight 223 lb (101.2 kg), unknown if currently breastfeeding.   General: No apparent distress alert and oriented x3 mood and affect normal, dressed appropriately.  HEENT: Pupils equal, extraocular movements intact  Respiratory: Patient's speak in full sentences and does not appear short of breath  Cardiovascular: No lower extremity edema, non tender, no erythema  Gait normal with good balance and coordination.  MSK: Right foot exam  shows the patient does have breakdown of the transverse.  Patient does have tenderness to palpation over the fourth.  Patient does have some limited range of motion of the ankle bilaterally. Left elbow pain with resisted wrist extension at the lateral epicondylar region.  Tender to palpation over the lateral epicondylar region.   Limited muscular skeletal ultrasound was performed and interpreted by Antoine Primas, M  Limited ultrasound of patient's foot shows the patient does have a cyst formation over the third metatarsal distally patient also has a neuroma noted between the fourth and fifth.  Regarding patient's left elbow patient does have some intersubstance tearing noted the common extensor tendon at the lateral epicondylar region.  Impression: Foot cyst, neuroma as well as lateral epicondylitis with common extensor tearing noted.  Procedure: Real-time  Ultrasound Guided Injection of right foot cyst Device: GE Logiq Q7 Ultrasound guided injection is preferred based studies that show increased duration, increased effect, greater accuracy, decreased procedural pain, increased response rate, and decreased cost with ultrasound guided versus blind injection.  Verbal informed consent obtained.  Time-out conducted.  Noted no overlying erythema, induration, or other signs of local infection.  Skin prepped in a sterile fashion.  Local anesthesia: Topical Ethyl chloride.  With sterile technique and under real time ultrasound guidance: With a 25-gauge half inch needle injected with 0.5 cc of 0.5% Marcaine and 0.5 cc of Kenalog 40 mg/mL. Completed without difficulty  Pain immediately resolved suggesting accurate placement of the medication.  Advised to call if fevers/chills, erythema, induration, drainage, or persistent bleeding.  Impression: Technically successful ultrasound guided injection. Impression: Cyst formation and neuroma of the left foot.   Impression and Recommendations:     The above documentation has been reviewed and is accurate and complete Judi Saa, DO

## 2021-05-28 ENCOUNTER — Ambulatory Visit: Payer: BC Managed Care – PPO | Admitting: Family Medicine

## 2021-05-28 ENCOUNTER — Ambulatory Visit: Payer: Self-pay

## 2021-05-28 ENCOUNTER — Encounter: Payer: Self-pay | Admitting: Family Medicine

## 2021-05-28 ENCOUNTER — Other Ambulatory Visit: Payer: Self-pay

## 2021-05-28 VITALS — BP 122/82 | HR 80 | Ht 66.0 in | Wt 223.0 lb

## 2021-05-28 DIAGNOSIS — M7712 Lateral epicondylitis, left elbow: Secondary | ICD-10-CM

## 2021-05-28 DIAGNOSIS — M79671 Pain in right foot: Secondary | ICD-10-CM | POA: Diagnosis not present

## 2021-05-28 DIAGNOSIS — M71371 Other bursal cyst, right ankle and foot: Secondary | ICD-10-CM

## 2021-05-28 MED ORDER — NITROGLYCERIN 0.2 MG/HR TD PT24: MEDICATED_PATCH | TRANSDERMAL | 0 refills | Status: DC

## 2021-05-28 NOTE — Assessment & Plan Note (Signed)
Patient does have more of a lateral epicondylitis.  Discussed icing regimen and home exercises.  Discussed avoiding overhead lifting.  Increase activity slowly.  Follow-up again in 6 to 8 weeks.  Worsening pain we will consider injections.  We will start range of motion patches and warned of potential side effects. ?

## 2021-05-28 NOTE — Patient Instructions (Signed)
Wear brace and night for 2 weeks ?Exercises  ?Nitro patches: ?Nitroglycerin Protocol ? ?? Apply 1/4 nitroglycerin patch to affected area daily. ?? Change position of patch within the affected area every 24 hours. ?? You may experience a headache during the first 1-2 weeks of using the patch, these should subside. ?? If you experience headaches after beginning nitroglycerin patch treatment, you may take your preferred over the counter pain reliever. ?? Another side effect of the nitroglycerin patch is skin irritation or rash related to patch adhesive. ?? Please notify our office if you develop more severe headaches or rash, and stop the patch. ?? Tendon healing with nitroglycerin patch may require 12 to 24 weeks depending on the extent of injury. ?? Men should not use if taking Viagra, Cialis, or Levitra.  ?? Do not use if you have migraines or rosacea. ? ?See me in 6 weeks ?

## 2021-05-28 NOTE — Assessment & Plan Note (Signed)
Patient given injection and tolerated the procedure well, discussed icing regimen and home exercises, discussed which activities to do and which ones to avoid, I do believe the patient also has a neuroma of the right foot that could be also contributing and this will be diagnostic as well as hopefully therapeutic.  We will see how patient responds and follow-up again in 5 to 6 weeks. ?

## 2021-06-03 ENCOUNTER — Other Ambulatory Visit: Payer: Self-pay

## 2021-06-03 ENCOUNTER — Encounter (INDEPENDENT_AMBULATORY_CARE_PROVIDER_SITE_OTHER): Payer: Self-pay | Admitting: Family Medicine

## 2021-06-03 ENCOUNTER — Ambulatory Visit (INDEPENDENT_AMBULATORY_CARE_PROVIDER_SITE_OTHER): Payer: BC Managed Care – PPO | Admitting: Family Medicine

## 2021-06-03 VITALS — BP 117/68 | HR 76 | Temp 98.0°F | Ht 66.0 in | Wt 218.0 lb

## 2021-06-03 DIAGNOSIS — E669 Obesity, unspecified: Secondary | ICD-10-CM

## 2021-06-03 DIAGNOSIS — R632 Polyphagia: Secondary | ICD-10-CM

## 2021-06-03 DIAGNOSIS — F418 Other specified anxiety disorders: Secondary | ICD-10-CM

## 2021-06-03 DIAGNOSIS — R600 Localized edema: Secondary | ICD-10-CM | POA: Diagnosis not present

## 2021-06-03 DIAGNOSIS — E559 Vitamin D deficiency, unspecified: Secondary | ICD-10-CM | POA: Diagnosis not present

## 2021-06-03 DIAGNOSIS — Z6841 Body Mass Index (BMI) 40.0 and over, adult: Secondary | ICD-10-CM

## 2021-06-03 DIAGNOSIS — Z6835 Body mass index (BMI) 35.0-35.9, adult: Secondary | ICD-10-CM

## 2021-06-03 MED ORDER — VITAMIN D (ERGOCALCIFEROL) 1.25 MG (50000 UNIT) PO CAPS: 50000.0000 [IU] | ORAL_CAPSULE | ORAL | 0 refills | Status: DC

## 2021-06-03 MED ORDER — VENLAFAXINE HCL ER 37.5 MG PO CP24: 37.5000 mg | ORAL_CAPSULE | Freq: Every day | ORAL | 1 refills | Status: DC

## 2021-06-03 MED ORDER — WEGOVY 2.4 MG/0.75ML ~~LOC~~ SOAJ: 2.4000 mg | SUBCUTANEOUS | 2 refills | Status: DC

## 2021-06-03 MED ORDER — SPIRONOLACTONE 50 MG PO TABS: 50.0000 mg | ORAL_TABLET | Freq: Every day | ORAL | 2 refills | Status: DC

## 2021-06-06 NOTE — Progress Notes (Signed)
Chief Complaint:   OBESITY Katie Glass is here to discuss her progress with her obesity treatment plan along with follow-up of her obesity related diagnoses. See Medical Weight Management Flowsheet for complete bioelectrical impedance results.  Today's visit was #: 31 Starting weight: 268 lbs Starting date: 05/17/2019 Weight change since last visit: 0 Total lbs lost to date: 50 lbs Total weight loss percentage to date: -18.66%  Nutrition Plan: Category 4 Plan for 20% of the time.  Activity: HIIT, cardio, walking for 60-90 minutes 5-6 times per week. Anti-obesity medications: Wegovy 2.4 mg subcutaneously weekly. Reported side effects: None.  Interim History: Katie Glass endorses increased situational stress.  Assessment/Plan:   1. Polyphagia Controlled. Current treatment: Wegovy 2.4 mg subcutaneously weekly.    Plan: Continue Wegovy 2.4 mg subcutaneously weekly.  Will refill today, as per below.  She will continue to focus on protein-rich, low simple carbohydrate foods. We reviewed the importance of hydration, regular exercise for stress reduction, and restorative sleep.  - Refill Semaglutide-Weight Management (WEGOVY) 2.4 MG/0.75ML SOAJ; Inject 2.4 mg into the skin once a week.  Dispense: 3 mL; Refill: 2  2. Lower extremity edema Katie Glass is taking spironolactone 50 mg daily for her lower extremity edema.  Plan:  Continue spironolactone 50 mg daily.  Will refill today, as per below.  - Refill spironolactone (ALDACTONE) 50 MG tablet; Take 1 tablet (50 mg total) by mouth daily.  Dispense: 90 tablet; Refill: 2  3. Vitamin D deficiency Not at goal. She is taking vitamin D 50,000 IU weekly.  Plan: Continue to take prescription Vitamin D @50 ,000 IU every week as prescribed.  Follow-up for routine testing of Vitamin D, at least 2-3 times per year to avoid over-replacement.  Lab Results  Component Value Date   VD25OH 40.6 06/28/2020   VD25OH 25.0 (L) 09/21/2019   VD25OH 22.9 (L)  05/17/2019   - Refill Vitamin D, Ergocalciferol, (DRISDOL) 1.25 MG (50000 UNIT) CAPS capsule; Take 1 capsule (50,000 Units total) by mouth every 7 (seven) days.  Dispense: 12 capsule; Refill: 0  4. Situational anxiety [name] endorses increased anxiety recently.  She is not currently taking any medication for this.  Plan:  Start Effexor-XR 37.5 mg daily.  - Start venlafaxine XR (EFFEXOR XR) 37.5 MG 24 hr capsule; Take 1 capsule (37.5 mg total) by mouth daily with breakfast.  Dispense: 30 capsule; Refill: 1  5. Obesity, current BMI 35.2  Course: Katie Glass is currently in the action stage of change. As such, her goal is to continue with weight loss efforts.   Nutrition goals: She has agreed to the Category 4 Plan.   Exercise goals:  As is.  Behavioral modification strategies: increasing lean protein intake, decreasing simple carbohydrates, increasing vegetables, and increasing water intake.  Katie Glass has agreed to follow-up with our clinic in 4 weeks. She was informed of the importance of frequent follow-up visits to maximize her success with intensive lifestyle modifications for her multiple health conditions.   Objective:   Blood pressure 117/68, pulse 76, temperature 98 F (36.7 C), height 5\' 6"  (1.676 m), weight 218 lb (98.9 kg), SpO2 100 %, unknown if currently breastfeeding. Body mass index is 35.19 kg/m.  General: Cooperative, alert, well developed, in no acute distress. HEENT: Conjunctivae and lids unremarkable. Cardiovascular: Regular rhythm.  Lungs: Normal work of breathing. Neurologic: No focal deficits.   Lab Results  Component Value Date   CREATININE 0.94 06/28/2020   BUN 16 06/28/2020   NA 139 06/28/2020   K  4.3 06/28/2020   CL 103 06/28/2020   CO2 21 06/28/2020   Lab Results  Component Value Date   ALT 11 06/28/2020   AST 14 06/28/2020   ALKPHOS 44 06/28/2020   BILITOT 0.6 06/28/2020   Lab Results  Component Value Date   HGBA1C 5.4 06/28/2020   HGBA1C  5.2 09/21/2019   HGBA1C 5.4 05/17/2019   Lab Results  Component Value Date   INSULIN 4.0 06/28/2020   INSULIN 7.6 09/21/2019   INSULIN 13.2 05/17/2019   Lab Results  Component Value Date   TSH 1.350 05/17/2019   Lab Results  Component Value Date   CHOL 155 06/28/2020   HDL 62 06/28/2020   LDLCALC 81 06/28/2020   TRIG 57 06/28/2020   CHOLHDL 3.3 05/17/2019   Lab Results  Component Value Date   VD25OH 40.6 06/28/2020   VD25OH 25.0 (L) 09/21/2019   VD25OH 22.9 (L) 05/17/2019   Lab Results  Component Value Date   WBC 6.7 06/28/2020   HGB 14.9 06/28/2020   HCT 44.2 06/28/2020   MCV 93 06/28/2020   PLT 209 06/28/2020   Lab Results  Component Value Date   IRON 93 09/21/2019   TIBC 299 09/21/2019   FERRITIN 51 09/21/2019   Attestation Statements:   Reviewed by clinician on day of visit: allergies, medications, problem list, medical history, surgical history, family history, social history, and previous encounter notes.  I, Insurance claims handler, CMA, am acting as transcriptionist for Helane Rima, DO  I have reviewed the above documentation for accuracy and completeness, and I agree with the above. -  Helane Rima, DO, MS, FAAFP, DABOM - Family and Bariatric Medicine.

## 2021-06-07 DIAGNOSIS — G4733 Obstructive sleep apnea (adult) (pediatric): Secondary | ICD-10-CM | POA: Diagnosis not present

## 2021-06-27 ENCOUNTER — Ambulatory Visit (INDEPENDENT_AMBULATORY_CARE_PROVIDER_SITE_OTHER): Payer: BC Managed Care – PPO | Admitting: Family Medicine

## 2021-06-27 ENCOUNTER — Encounter (INDEPENDENT_AMBULATORY_CARE_PROVIDER_SITE_OTHER): Payer: Self-pay | Admitting: Family Medicine

## 2021-06-27 VITALS — BP 113/73 | HR 73 | Temp 98.0°F | Ht 66.0 in | Wt 218.0 lb

## 2021-06-27 DIAGNOSIS — R600 Localized edema: Secondary | ICD-10-CM | POA: Diagnosis not present

## 2021-06-27 DIAGNOSIS — Z6835 Body mass index (BMI) 35.0-35.9, adult: Secondary | ICD-10-CM

## 2021-06-27 DIAGNOSIS — R632 Polyphagia: Secondary | ICD-10-CM

## 2021-06-27 DIAGNOSIS — E669 Obesity, unspecified: Secondary | ICD-10-CM

## 2021-06-27 DIAGNOSIS — E559 Vitamin D deficiency, unspecified: Secondary | ICD-10-CM

## 2021-06-27 DIAGNOSIS — F418 Other specified anxiety disorders: Secondary | ICD-10-CM

## 2021-06-27 MED ORDER — VITAMIN D (ERGOCALCIFEROL) 1.25 MG (50000 UNIT) PO CAPS: 50000.0000 [IU] | ORAL_CAPSULE | ORAL | 0 refills | Status: DC

## 2021-06-27 MED ORDER — WEGOVY 2.4 MG/0.75ML ~~LOC~~ SOAJ: 2.4000 mg | SUBCUTANEOUS | 2 refills | Status: DC

## 2021-06-27 MED ORDER — SPIRONOLACTONE 50 MG PO TABS: 50.0000 mg | ORAL_TABLET | Freq: Every day | ORAL | 2 refills | Status: DC

## 2021-06-27 MED ORDER — VENLAFAXINE HCL ER 37.5 MG PO CP24: 37.5000 mg | ORAL_CAPSULE | Freq: Every day | ORAL | 1 refills | Status: AC

## 2021-07-02 NOTE — Progress Notes (Signed)
Chief Complaint:   OBESITY Katie Glass is here to discuss her progress with her obesity treatment plan along with follow-up of her obesity related diagnoses. See Medical Weight Management Flowsheet for complete bioelectrical impedance results.  Today's visit was #: 32 Starting weight: 268 lbs Starting date: 05/17/2019 Weight change since last visit: 0 Total lbs lost to date: 50 lbs Total weight loss percentage to date: -18.66%  Nutrition Plan: Category 4 Plan for 35% of the time.  Activity: Walking/HIIT/strength training for 60-90 minutes 5-6 times per week. Anti-obesity medications: Wegovy 2.4 mg subcutaneously weekly. Reported side effects: None.  Interim History: [name] says she has been focusing on water, logging, and bringing her lunch.  Assessment/Plan:   1. Vitamin D deficiency Not at goal. She is taking vitamin D 50,000 IU weekly.  Plan: Continue to take prescription Vitamin D @50 ,000 IU every week as prescribed.  Follow-up for routine testing of Vitamin D, at least 2-3 times per year to avoid over-replacement.  Lab Results  Component Value Date   VD25OH 40.6 06/28/2020   VD25OH 25.0 (L) 09/21/2019   VD25OH 22.9 (L) 05/17/2019   - Refill Vitamin D, Ergocalciferol, (DRISDOL) 1.25 MG (50000 UNIT) CAPS capsule; Take 1 capsule (50,000 Units total) by mouth every 7 (seven) days.  Dispense: 12 capsule; Refill: 0  2. Lower extremity edema [name] is taking spironolactone 50 mg daily for her lower extremity edema.   Plan:  Continue spironolactone 50 mg daily.  Will refill today, as per below.  - Refill spironolactone (ALDACTONE) 50 MG tablet; Take 1 tablet (50 mg total) by mouth daily.  Dispense: 90 tablet; Refill: 2  3. Polyphagia Controlled. Current treatment: Wegovy 2.4 mg subcutaneously weekly.    Plan:  Continue Wegovy 2.4 mg subcutaneously weekly.  She will continue to focus on protein-rich, low simple carbohydrate foods. We reviewed the importance of hydration,  regular exercise for stress reduction, and restorative sleep.  - Refill Semaglutide-Weight Management (WEGOVY) 2.4 MG/0.75ML SOAJ; Inject 2.4 mg into the skin once a week.  Dispense: 3 mL; Refill: 2  4. Situational anxiety [name] endorses increased anxiety recently.  She is not currently taking any medication for this.   Plan:  Continue Effexor-XR 37.5 mg daily.  Will refill today, as per below.  - Refill venlafaxine XR (EFFEXOR XR) 37.5 MG 24 hr capsule; Take 1 capsule (37.5 mg total) by mouth daily with breakfast.  Dispense: 30 capsule; Refill: 1  5. Obesity, current BMI 35.2  Course: [name] is currently in the action stage of change. As such, her goal is to continue with weight loss efforts.   Nutrition goals: She has agreed to the Category 4 Plan.   Exercise goals:  As is.  Behavioral modification strategies: increasing lean protein intake, decreasing simple carbohydrates, increasing vegetables, and increasing water intake.  [name] has agreed to follow-up with our clinic in 6 weeks. She was informed of the importance of frequent follow-up visits to maximize her success with intensive lifestyle modifications for her multiple health conditions.   Objective:   Blood pressure 113/73, pulse 73, temperature 98 F (36.7 C), temperature source Oral, height 5\' 6"  (1.676 m), weight 218 lb (98.9 kg), SpO2 97 %, unknown if currently breastfeeding. Body mass index is 35.19 kg/m.  General: Cooperative, alert, well developed, in no acute distress. HEENT: Conjunctivae and lids unremarkable. Cardiovascular: Regular rhythm.  Lungs: Normal work of breathing. Neurologic: No focal deficits.   Lab Results  Component Value Date   CREATININE 0.94 06/28/2020  BUN 16 06/28/2020   NA 139 06/28/2020   K 4.3 06/28/2020   CL 103 06/28/2020   CO2 21 06/28/2020   Lab Results  Component Value Date   ALT 11 06/28/2020   AST 14 06/28/2020   ALKPHOS 44 06/28/2020   BILITOT 0.6 06/28/2020    Lab Results  Component Value Date   HGBA1C 5.4 06/28/2020   HGBA1C 5.2 09/21/2019   HGBA1C 5.4 05/17/2019   Lab Results  Component Value Date   INSULIN 4.0 06/28/2020   INSULIN 7.6 09/21/2019   INSULIN 13.2 05/17/2019   Lab Results  Component Value Date   TSH 1.350 05/17/2019   Lab Results  Component Value Date   CHOL 155 06/28/2020   HDL 62 06/28/2020   LDLCALC 81 06/28/2020   TRIG 57 06/28/2020   CHOLHDL 3.3 05/17/2019   Lab Results  Component Value Date   VD25OH 40.6 06/28/2020   VD25OH 25.0 (L) 09/21/2019   VD25OH 22.9 (L) 05/17/2019   Lab Results  Component Value Date   WBC 6.7 06/28/2020   HGB 14.9 06/28/2020   HCT 44.2 06/28/2020   MCV 93 06/28/2020   PLT 209 06/28/2020   Lab Results  Component Value Date   IRON 93 09/21/2019   TIBC 299 09/21/2019   FERRITIN 51 09/21/2019   Attestation Statements:   Reviewed by clinician on day of visit: allergies, medications, problem list, medical history, surgical history, family history, social history, and previous encounter notes.  I, Insurance claims handler, CMA, am acting as transcriptionist for Helane Rima, DO  I have reviewed the above documentation for accuracy and completeness, and I agree with the above. -  Helane Rima, DO, MS, FAAFP, DABOM - Family and Bariatric Medicine.

## 2021-07-03 NOTE — Progress Notes (Signed)
?Katie Files D.O. ?[facility] ?[address] [place] [address] ?Phone: [phone] ?Subjective:   ?I, Katie Glass, am serving as a scribe for Dr. Antoine Primas. ? ?This visit occurred during the SARS-CoV-2 public health emergency.  Safety protocols were in place, including screening questions prior to the visit, additional usage of staff PPE, and extensive cleaning of exam room while observing appropriate contact time as indicated for disinfecting solutions.  ? ? ?I'm seeing this patient by the request  of:  Katie Joe, MD ? ?CC: Elbow pain follow-up ? ?VOZ:[id number]  ?05/28/2021 ?Patient given injection and tolerated the procedure well, discussed icing regimen and home exercises, discussed which activities to do and which ones to avoid, I do believe the patient also has a neuroma of the right foot that could be also contributing and this will be diagnostic as well as hopefully therapeutic.  We will see how patient responds and follow-up again in 5 to 6 weeks. ? ?Patient does have more of a lateral epicondylitis.  Discussed icing regimen and home exercises.  Discussed avoiding overhead lifting.  Increase activity slowly.  Follow-up again in 6 to 8 weeks.  Worsening pain we will consider injections.  We will start range of motion patches and warned of potential side effects. ? ?Update 07/09/2021 ?Katie Glass is a 46 y.o. female coming in with complaint of L elbow and R foot pain. Patient states that her ankle is fine. Patient's pain is worse after sitting for a while. Wears OOFOS in house. Injection did help last visit.  ? ?Patient has been using 1/2 patch of nitro for elbow. Discontinued gabapentin as this did not help her pain. Pain depends on the day and activity for patient. Pain is slightly better but when pain is present it is very sharp. Dropped a lid from pot on arm due to pain. Wearing B wrist braces at night which did help somewhat. Uses patch on R elbow from time to time.   ? ? ?  ? ?Past Medical History:  ?Diagnosis Date  ? Arthritis   ? knees  ? Asthma   ? as a child  ? Back pain   ? Food allergy   ? Tannin/sulfate in [employer], rash on face and hands  ? Joint pain   ? Lower extremity edema   ? MMT (medial meniscus tear)   ? right  ? Sleep apnea   ? CPAP nightly  ? Umbilical hernia   ? ?Past Surgical History:  ?Procedure Laterality Date  ? BILATERAL SALPINGECTOMY Bilateral 04/15/2018  ? Procedure: BILATERAL SALPINGECTOMY;  Surgeon: Carrington Clamp, MD;  Location: [facility] BIRTHING SU[facility];  Service: Obstetrics;  Laterality: Bilateral;  ? CESAREAN SECTION  11/12/2010  ? Procedure: CESAREAN SECTION;  Surgeon: Almon Hercules;  Location: [facility] ORS;  Service: Gynecology;  Laterality: N/A;  ? CESAREAN SECTION N/A 04/15/2018  ? Procedure: CESAREAN SECTION;  Surgeon: Carrington Clamp, MD;  Location: [facility] BIRTHING SUITES;  Service: Obstetrics;  Laterality: N/A;  ? KNEE ARTHROSCOPY Right 02/11/2017  ? Procedure: RIGHT KNEE ARTHROSCOPY WITH DEBRIDEMENT CHONDROMALACIA;  Surgeon: Gean Birchwood, MD;  Location: [facility];  Service: Orthopedics;  Laterality: Right;  ? ?Social History  ? ?Socioeconomic History  ? Marital status: Married  ?  Spouse name: [name]  ? Number of children: Not on file  ? Years of education: Not on file  ? Highest education level: Not on file  ?Occupational History  ? Occupation: Education officer, environmental  ?Tobacco Use  ?  Smoking status: Never  ? Smokeless tobacco: Never  ?Vaping Use  ? Vaping Use: Never used  ?Substance and Sexual Activity  ? Alcohol use: Yes  ?  Comment: social  ? Drug use: No  ? Sexual activity: Yes  ?  Birth control/protection: None  ?Other Topics Concern  ? Not on file  ?Social History Narrative  ? Not on file  ? ?Social Determinants of Health  ? ?Financial Resource Strain: Not on file  ?Food Insecurity: Not on file  ?Transportation Needs: Not on file  ?Physical Activity: Not on file  ?Stress: Not on file  ?Social Connections: Not on file  ? ?No Known  Allergies ?Family History  ?Problem Relation Age of Onset  ? Hypertension Father   ? Hyperlipidemia Father   ? Heart disease Father   ? Cancer Maternal Grandfather   ? Heart disease Paternal Grandfather   ? Pulmonary embolism Mother   ? Alcohol abuse Mother   ? ? ?Current Outpatient Medications (Endocrine & Metabolic):  ?  levonorgestrel (MIRENA) 20 MCG/24HR IUD, 1 each by Intrauterine route once. ? ?Current Outpatient Medications (Cardiovascular):  ?  nitroGLYCERIN (NITRO-DUR) 0.2 mg/hr patch, Apply 1/4 of a patch to skin once daily. ?  spironolactone (ALDACTONE) 50 MG tablet, Take 1 tablet (50 mg total) by mouth daily. ? ?Current Outpatient Medications (Respiratory):  ?  cetirizine (ZYRTEC) 10 MG tablet, Take 10 mg by mouth daily. ? ? ? ?Current Outpatient Medications (Other):  ?  gabapentin (NEURONTIN) 100 MG capsule, Take 2 capsules (200 mg total) by mouth at bedtime. ?  Lisdexamfetamine Dimesylate (VYVANSE) 60 MG CHEW, Chew 60 mg by mouth daily. ?  Lisdexamfetamine Dimesylate (VYVANSE) 60 MG CHEW, Chew 60 mg by mouth every morning. ?  Lisdexamfetamine Dimesylate (VYVANSE) 60 MG CHEW, Chew 60 mg by mouth every morning. ?  Multiple Vitamins-Minerals (WOMENS MULTI VITAMIN & MINERAL PO), Take 1 tablet by mouth daily. ?  Semaglutide-Weight Management (WEGOVY) 2.4 MG/0.75ML SOAJ, Inject 2.4 mg into the skin once a week. ?  venlafaxine XR (EFFEXOR XR) 37.5 MG 24 hr capsule, Take 1 capsule (37.5 mg total) by mouth daily with breakfast. ?  Vitamin D, Ergocalciferol, (DRISDOL) 1.25 MG (50000 UNIT) CAPS capsule, Take 1 capsule (50,000 Units total) by mouth every 7 (seven) days. ? ? ?Reviewed prior external information including notes and imaging from  ?primary care provider ?As well as notes that were available from care everywhere and other healthcare systems. ? ?Past medical history, social, surgical and family history all reviewed in electronic medical record.  No pertanent information unless stated regarding to the  chief complaint.  ? ?Review of Systems: ? No headache, visual changes, nausea, vomiting, diarrhea, constipation, dizziness, abdominal pain, skin rash, fevers, chills, night sweats, weight loss, swollen lymph nodes, body aches, joint swelling, chest pain, shortness of breath, mood changes. POSITIVE muscle aches ? ?Objective  ?Blood pressure 122/88, pulse 99, height 5\' 6"  (1.676 m), weight 223 lb (101.2 kg), SpO2 99 %, unknown if currently breastfeeding. ?  ?General: No apparent distress alert and oriented x3 mood and affect normal, dressed appropriately.  ?HEENT: Pupils equal, extraocular movements intact  ?Respiratory: Patient's speak in full sentences and does not appear short of breath  ?Cardiovascular: No lower extremity edema, non tender, no erythema  ?Gait normal with good balance and coordination.  ?MSK: Left leg redness and tenderness to palpation of the lateral epicondylar region.  Patient was starting to have some mild pain on the contralateral side as well.  Good grip strength is still noted.  Worsening pain at the elbow with resisted wrist extension ? ?Limited muscular skeletal ultrasound was performed and interpreted by Antoine PrimasSMITH, Dennard Vezina, M  ? ?Limited ultrasound of patient's common extensor tendon shows similar.  Tearing noted consistent with some hypoechoic changes and increasing vascularization.  No cortical irregularity noted around the region. ?Impression:.  Tearing  of the common extensor tendon with no retraction and no avulsion ? ?  ?Impression and Recommendations:  ?  ? ?The above documentation has been reviewed and is accurate and complete Judi SaaZachary M Efton Thomley, DO ? ? ? ?

## 2021-07-08 DIAGNOSIS — G4733 Obstructive sleep apnea (adult) (pediatric): Secondary | ICD-10-CM | POA: Diagnosis not present

## 2021-07-09 ENCOUNTER — Ambulatory Visit: Payer: Self-pay

## 2021-07-09 ENCOUNTER — Ambulatory Visit: Payer: BC Managed Care – PPO | Admitting: Family Medicine

## 2021-07-09 ENCOUNTER — Encounter: Payer: Self-pay | Admitting: Family Medicine

## 2021-07-09 VITALS — BP 122/88 | HR 99 | Ht 66.0 in | Wt 223.0 lb

## 2021-07-09 DIAGNOSIS — M7712 Lateral epicondylitis, left elbow: Secondary | ICD-10-CM | POA: Diagnosis not present

## 2021-07-09 DIAGNOSIS — M25522 Pain in left elbow: Secondary | ICD-10-CM

## 2021-07-09 NOTE — Patient Instructions (Signed)
Good to see you ?Don't let cape fool you ?Go back to wrist brace day/night for 2 weeks ?Compression or counter force ?Avoid overhand lifting ?See me in 7-8 weeks ?

## 2021-07-09 NOTE — Assessment & Plan Note (Signed)
Chronic, with worsening symptoms.  Patient on ultrasound today does seem to have more of interstitial tearing noted of the tendon.  Patient will continue with the nitroglycerin.  We discussed the possibility of PRP.  Discussed with patient does need to avoid all overhand activity.  Discussed that this can take multiple months to completely resolve.  Patient will follow-up again in 6 to 8 weeks. ?

## 2021-07-25 ENCOUNTER — Ambulatory Visit (INDEPENDENT_AMBULATORY_CARE_PROVIDER_SITE_OTHER): Payer: BC Managed Care – PPO | Admitting: Family Medicine

## 2021-08-03 ENCOUNTER — Other Ambulatory Visit: Payer: Self-pay | Admitting: Family Medicine

## 2021-08-07 DIAGNOSIS — F909 Attention-deficit hyperactivity disorder, unspecified type: Secondary | ICD-10-CM | POA: Diagnosis not present

## 2021-08-07 DIAGNOSIS — F418 Other specified anxiety disorders: Secondary | ICD-10-CM | POA: Diagnosis not present

## 2021-08-07 DIAGNOSIS — R632 Polyphagia: Secondary | ICD-10-CM | POA: Diagnosis not present

## 2021-08-07 DIAGNOSIS — E669 Obesity, unspecified: Secondary | ICD-10-CM | POA: Diagnosis not present

## 2021-08-23 NOTE — Progress Notes (Signed)
[facility] [address] [place] [address] Phone: [phone] Subjective:   Katie Glass, am serving as a scribe for Dr. Antoine Primas.  I'm seeing this patient by the request  of:  Katie Joe, MD  CC: Left elbow pain  [id number]  07/09/2021 Chronic, with worsening symptoms.  Patient on ultrasound today does seem to have more of interstitial tearing noted of the tendon.  Patient will continue with the nitroglycerin.  We discussed the possibility of PRP.  Discussed with patient does need to avoid all overhand activity.  Discussed that this can take multiple months to completely resolve.  Patient will follow-up again in 6 to 8 weeks.  Update 08/27/2021 Katie Glass is a 46 y.o. female coming in with complaint of L elbow pain. Patient states feels like we are moving in right direction. Hasn't used the patches in about a week or so. Doing better. No new complaints.  Patient states that is making progress she has had some mild discomfort overall.       Past Medical History:  Diagnosis Date   Arthritis    knees   Asthma    as a child   Back pain    Food allergy    Tannin/sulfate in Wine and ciders, rash on face and hands   Joint pain    Lower extremity edema    MMT (medial meniscus tear)    right   Sleep apnea    CPAP nightly   Umbilical hernia    Past Surgical History:  Procedure Laterality Date   BILATERAL SALPINGECTOMY Bilateral 04/15/2018   Procedure: BILATERAL SALPINGECTOMY;  Surgeon: Carrington Clamp, MD;  Location: [facility] BIRTHING SUITES;  Service: Obstetrics;  Laterality: Bilateral;   CESAREAN SECTION  11/12/2010   Procedure: CESAREAN SECTION;  Surgeon: Almon Hercules;  Location: [facility];  Service: Gynecology;  Laterality: N/A;   CESAREAN SECTION N/A 04/15/2018   Procedure: CESAREAN SECTION;  Surgeon: Carrington Clamp, MD;  Location: [facility] BIRTHING SUITES;  Service: Obstetrics;  Laterality: N/A;   KNEE ARTHROSCOPY Right  02/11/2017   Procedure: RIGHT KNEE ARTHROSCOPY WITH DEBRIDEMENT CHONDROMALACIA;  Surgeon: Gean Birchwood, MD;  Location: [facility];  Service: Orthopedics;  Laterality: Right;   Social History   Socioeconomic History   Marital status: Married    Spouse name: Benay Pomeroy   Number of children: Not on file   Years of education: Not on file   Highest education level: Not on file  Occupational History   Occupation: Education officer, environmental  Tobacco Use   Smoking status: Never   Smokeless tobacco: Never  Vaping Use   Vaping Use: Never used  Substance and Sexual Activity   Alcohol use: Yes    Comment: social   Drug use: No   Sexual activity: Yes    Birth control/protection: None  Other Topics Concern   Not on file  Social History Narrative   Not on file   Social Determinants of Health   Financial Resource Strain: Not on file  Food Insecurity: Not on file  Transportation Needs: Not on file  Physical Activity: Not on file  Stress: Not on file  Social Connections: Not on file   No Known Allergies Family History  Problem Relation Age of Onset   Hypertension Father    Hyperlipidemia Father    Heart disease Father    Cancer Maternal Grandfather    Heart disease Paternal Grandfather    Pulmonary embolism Mother  Alcohol abuse Mother     Current Outpatient Medications (Endocrine & Metabolic):    levonorgestrel (MIRENA) 20 MCG/24HR IUD, 1 each by Intrauterine route once.  Current Outpatient Medications (Cardiovascular):    nitroGLYCERIN (NITRO-DUR) 0.2 mg/hr patch, Apply 1/4 of a patch to skin once daily.   spironolactone (ALDACTONE) 50 MG tablet, Take 1 tablet (50 mg total) by mouth daily.  Current Outpatient Medications (Respiratory):    cetirizine (ZYRTEC) 10 MG tablet, Take 10 mg by mouth daily.    Current Outpatient Medications (Other):    gabapentin (NEURONTIN) 100 MG capsule, TAKE 2 CAPSULES BY MOUTH AT BEDTIME.   Lisdexamfetamine Dimesylate (VYVANSE) 60 MG  CHEW, Chew 60 mg by mouth daily.   Lisdexamfetamine Dimesylate (VYVANSE) 60 MG CHEW, Chew 60 mg by mouth every morning.   Lisdexamfetamine Dimesylate (VYVANSE) 60 MG CHEW, Chew 60 mg by mouth every morning.   Multiple Vitamins-Minerals (WOMENS MULTI VITAMIN & MINERAL PO), Take 1 tablet by mouth daily.   Semaglutide-Weight Management (WEGOVY) 2.4 MG/0.75ML SOAJ, Inject 2.4 mg into the skin once a week.   venlafaxine XR (EFFEXOR XR) 37.5 MG 24 hr capsule, Take 1 capsule (37.5 mg total) by mouth daily with breakfast.   Vitamin D, Ergocalciferol, (DRISDOL) 1.25 MG (50000 UNIT) CAPS capsule, Take 1 capsule (50,000 Units total) by mouth every 7 (seven) days.   Objective  Blood pressure 124/86, pulse 90, height 5\' 6"  (1.676 m), weight 229 lb (103.9 kg), SpO2 96 %, unknown if currently breastfeeding.   General: No apparent distress alert and oriented x3 mood and affect normal, dressed appropriately.  HEENT: Pupils equal, extraocular movements intact  Respiratory: Patient's speak in full sentences and does not appear short of breath  Cardiovascular: No lower extremity edema, non tender, no erythema  Gait normal with good balance and coordination.  MSK: Patient's left elbow does have some tenderness to palpation over the lateral epicondylar region.  Patient does have some limited extension of the wrist noted.  Patient does have good grip strength noted though.  Full range of motion of the elbow noted.  Limited muscular skeletal ultrasound was performed and interpreted by , M  Limited ultrasound still shows that patient does have some increased neovascularization that is improved from previous exam but does still have some hypoechoic changes of the tendon at the insertion on the bone.  No cortical irregularities are noted. Impression: Interval improvement but still hypoechoic changes noted   Impression and Recommendations:     The above documentation has been reviewed and is accurate and  complete Antoine Primas, DO

## 2021-08-27 ENCOUNTER — Encounter: Payer: Self-pay | Admitting: Family Medicine

## 2021-08-27 ENCOUNTER — Ambulatory Visit: Payer: Self-pay

## 2021-08-27 ENCOUNTER — Ambulatory Visit: Payer: BC Managed Care – PPO | Admitting: Family Medicine

## 2021-08-27 VITALS — BP 124/86 | HR 90 | Ht 66.0 in | Wt 229.0 lb

## 2021-08-27 DIAGNOSIS — M7712 Lateral epicondylitis, left elbow: Secondary | ICD-10-CM | POA: Diagnosis not present

## 2021-08-27 NOTE — Assessment & Plan Note (Signed)
Patient is doing much better overall.  Still has some mild hypoechoic changes noted.  Still not completely healed.  Encouraged her to continue with the nitroglycerin.  Patient will continue with compression, icing and avoiding certain activities and follow-up again in 6 to 8 weeks

## 2021-08-27 NOTE — Patient Instructions (Signed)
Looking good Keeping doing nitro Add weights little by little, but keep hands neutral See you again in 6-8 weeks

## 2021-09-10 DIAGNOSIS — F418 Other specified anxiety disorders: Secondary | ICD-10-CM | POA: Diagnosis not present

## 2021-09-10 DIAGNOSIS — R7301 Impaired fasting glucose: Secondary | ICD-10-CM | POA: Diagnosis not present

## 2021-09-10 DIAGNOSIS — F5081 Binge eating disorder: Secondary | ICD-10-CM | POA: Diagnosis not present

## 2021-09-17 DIAGNOSIS — Z1231 Encounter for screening mammogram for malignant neoplasm of breast: Secondary | ICD-10-CM | POA: Diagnosis not present

## 2021-09-17 DIAGNOSIS — Z6836 Body mass index (BMI) 36.0-36.9, adult: Secondary | ICD-10-CM | POA: Diagnosis not present

## 2021-09-17 DIAGNOSIS — Z01419 Encounter for gynecological examination (general) (routine) without abnormal findings: Secondary | ICD-10-CM | POA: Diagnosis not present

## 2021-10-07 DIAGNOSIS — F418 Other specified anxiety disorders: Secondary | ICD-10-CM | POA: Diagnosis not present

## 2021-10-07 DIAGNOSIS — F5081 Binge eating disorder: Secondary | ICD-10-CM | POA: Diagnosis not present

## 2021-10-07 DIAGNOSIS — R632 Polyphagia: Secondary | ICD-10-CM | POA: Diagnosis not present

## 2021-10-07 NOTE — Progress Notes (Unsigned)
[name] [address] [place] [address] Phone: [phone] Subjective:   Katie Glass, am serving as a scribe for Dr. Antoine Primas.  I'm seeing this patient by the request  of:  [name], MD  CC: Left elbow pain  NTI:[id number]  08/27/2021 Patient is doing much better overall.  Still has some mild hypoechoic changes noted.  Still not completely healed.  Encouraged her to continue with the nitroglycerin.  Patient will continue with compression, icing and avoiding certain activities and follow-up again in 6 to 8 weeks  Update 10/08/2021 Katie Glass is a 46 y.o. female coming in with complaint of L elbow pain. Patient states that she has good and bad days. Has only been able to lift light weights. Notices pain when she holds the video game controller or phone. Notes tingling in both hand bilaterally. Has not been using nitro patches as she does not feel like she was seeing marked improvement.        Past Medical History:  Diagnosis Date   Arthritis    knees   Asthma    as a child   Back pain    Food allergy    Tannin/sulfate in Wine and ciders, rash on face and hands   Joint pain    Lower extremity edema    MMT (medial meniscus tear)    right   Sleep apnea    CPAP nightly   Umbilical hernia    Past Surgical History:  Procedure Laterality Date   BILATERAL SALPINGECTOMY Bilateral 04/15/2018   Procedure: BILATERAL SALPINGECTOMY;  Surgeon: Carrington Clamp, MD;  Location: [facility] SU[facility];  Service: Obstetrics;  Laterality: Bilateral;   CESAREAN SECTION  11/12/2010   Procedure: CESAREAN SECTION;  Surgeon: Almon Hercules;  Location: [facility];  Service: Gynecology;  Laterality: N/A;   CESAREAN SECTION N/A 04/15/2018   Procedure: CESAREAN SECTION;  Surgeon: Carrington Clamp, MD;  Location: [facility] BIRTHING SUITES;  Service: Obstetrics;  Laterality: N/A;   KNEE ARTHROSCOPY Right 02/11/2017   Procedure: RIGHT KNEE ARTHROSCOPY WITH  DEBRIDEMENT CHONDROMALACIA;  Surgeon: Gean Birchwood, MD;  Location: [facility];  Service: Orthopedics;  Laterality: Right;   Social History   Socioeconomic History   Marital status: Married    Spouse name: Shephanie Romas   Number of children: Not on file   Years of education: Not on file   Highest education level: Not on file  Occupational History   Occupation: Pastor  Tobacco Use   Smoking status: Never   Smokeless tobacco: Never  Vaping Use   Vaping Use: Never used  Substance and Sexual Activity   Alcohol use: Yes    Comment: social   Drug use: No   Sexual activity: Yes    Birth control/protection: None  Other Topics Concern   Not on file  Social History Narrative   Not on file   Social Determinants of Health   Financial Resource Strain: Low Risk  (04/06/2018)   Overall Financial Resource Strain (CARDIA)    Difficulty of Paying Living Expenses: Not hard at all  Food Insecurity: No Food Insecurity (04/06/2018)   Hunger Vital Sign    Worried About Running Out of Food in the Last Year: Never true    Ran Out of Food in the Last Year: Never true  Transportation Needs: Unknown (04/06/2018)   PRAPARE - Administrator, Civil Service (Medical): No    Lack of Transportation (Non-Medical): Not on  file  Physical Activity: Not on file  Stress: Stress Concern Present (04/06/2018)   [employer] of Occupational Health - Occupational Stress Questionnaire    Feeling of Stress : Very much  Social Connections: Not on file   No Known Allergies Family History  Problem Relation Age of Onset   Hypertension Father    Hyperlipidemia Father    Heart disease Father    Cancer Maternal Grandfather    Heart disease Paternal Grandfather    Pulmonary embolism Mother    Alcohol abuse Mother     Current Outpatient Medications (Endocrine & Metabolic):    levonorgestrel (MIRENA) 20 MCG/24HR IUD, 1 each by Intrauterine route once.  Current Outpatient Medications  (Cardiovascular):    nitroGLYCERIN (NITRO-DUR) 0.2 mg/hr patch, Apply 1/4 of a patch to skin once daily.   spironolactone (ALDACTONE) 50 MG tablet, Take 1 tablet (50 mg total) by mouth daily.  Current Outpatient Medications (Respiratory):    cetirizine (ZYRTEC) 10 MG tablet, Take 10 mg by mouth daily.    Current Outpatient Medications (Other):    Lisdexamfetamine Dimesylate (VYVANSE) 60 MG CHEW, Chew 60 mg by mouth daily.   Lisdexamfetamine Dimesylate (VYVANSE) 60 MG CHEW, Chew 60 mg by mouth every morning.   Lisdexamfetamine Dimesylate (VYVANSE) 60 MG CHEW, Chew 60 mg by mouth every morning.   Multiple Vitamins-Minerals (WOMENS MULTI VITAMIN & MINERAL PO), Take 1 tablet by mouth daily.   Semaglutide-Weight Management (WEGOVY) 2.4 MG/0.75ML SOAJ, Inject 2.4 mg into the skin once a week.   venlafaxine XR (EFFEXOR XR) 37.5 MG 24 hr capsule, Take 1 capsule (37.5 mg total) by mouth daily with breakfast.   Vitamin D, Ergocalciferol, (DRISDOL) 1.25 MG (50000 UNIT) CAPS capsule, Take 1 capsule (50,000 Units total) by mouth every 7 (seven) days.   gabapentin (NEURONTIN) 100 MG capsule, TAKE 2 CAPSULES BY MOUTH AT BEDTIME.     Objective  Blood pressure (!) 124/92, pulse 94, height 5\' 6"  (1.676 m), weight 225 lb (102.1 kg), SpO2 98 %, unknown if currently breastfeeding.   General: No apparent distress alert and oriented x3 mood and affect normal, dressed appropriately.  HEENT: Pupils equal, extraocular movements intact  Respiratory: Patient's speak in full sentences and does not appear short of breath  Cardiovascular: No lower extremity edema, non tender, no erythema  Left elbow does have tenderness to palpation of the lateral epicondylar region.  Worsening pain with extension patient has full range of motion otherwise.  Limited muscular skeletal ultrasound was performed and interpreted by , M  Limited ultrasound shows the patient still has increasing Doppler flow at the ankle  exertion.  This seems to go within the body of the tendon diffusely.  Difficult to tell if there is any true intersubstance tearing noted.  No significant retraction noted.    Impression and Recommendations:     The above documentation has been reviewed and is accurate and complete Antoine Primas, DO

## 2021-10-08 ENCOUNTER — Ambulatory Visit: Payer: Self-pay

## 2021-10-08 ENCOUNTER — Ambulatory Visit: Payer: BC Managed Care – PPO | Admitting: Family Medicine

## 2021-10-08 VITALS — BP 124/92 | HR 94 | Ht 66.0 in | Wt 225.0 lb

## 2021-10-08 DIAGNOSIS — M7712 Lateral epicondylitis, left elbow: Secondary | ICD-10-CM

## 2021-10-08 DIAGNOSIS — M25522 Pain in left elbow: Secondary | ICD-10-CM

## 2021-10-08 NOTE — Assessment & Plan Note (Signed)
Patient with lateral epicondylitis still has significant increase in Doppler flow.  Could still be some intersubstance changes noted.  We discussed the potential for steroid injection with patient declined at this moment.  Patient will continue with the home exercises avoiding Repetitive extension of the wrist.  Follow-up with me again in 6 to 8 weeks

## 2021-10-30 ENCOUNTER — Encounter (INDEPENDENT_AMBULATORY_CARE_PROVIDER_SITE_OTHER): Payer: Self-pay

## 2021-11-04 DIAGNOSIS — R632 Polyphagia: Secondary | ICD-10-CM | POA: Diagnosis not present

## 2021-11-04 DIAGNOSIS — E559 Vitamin D deficiency, unspecified: Secondary | ICD-10-CM | POA: Diagnosis not present

## 2021-11-18 NOTE — Progress Notes (Unsigned)
[name] [address] [place] [address] Phone: [phone] Subjective:   Katie Glass, am serving as a scribe for Dr. Antoine Primas.  I'm seeing this patient by the request  of:  [name], MD  CC: Left elbow pain follow-up  GUR:[id number]  10/08/2021 Patient with lateral epicondylitis still has significant increase in Doppler flow.  Could still be some intersubstance changes noted.  We discussed the potential for steroid injection with patient declined at this moment.  Patient will continue with the home exercises avoiding Repetitive extension of the wrist.  Follow-up with me again in 6 to 8 weeks  Update 11/19/2021 Katie Glass is a 46 y.o. female coming in with complaint of L elbow pain. Patient states that she has been doing some light lifting. Tries to modify activity so that she can continue to work on strength.      Past Medical History:  Diagnosis Date   Arthritis    knees   Asthma    as a child   Back pain    Food allergy    Tannin/sulfate in Wine and ciders, rash on face and hands   Joint pain    Lower extremity edema    MMT (medial meniscus tear)    right   Sleep apnea    CPAP nightly   Umbilical hernia    Past Surgical History:  Procedure Laterality Date   BILATERAL SALPINGECTOMY Bilateral 04/15/2018   Procedure: BILATERAL SALPINGECTOMY;  Surgeon: Carrington Clamp, MD;  Location: [facility] BIRTHING SUIT[facility];  Service: Obstetrics;  Laterality: Bilateral;   CESAREAN SECTION  11/12/2010   Procedure: CESAREAN SECTION;  Surgeon: Almon Hercules;  Location: [facility];  Service: Gynecology;  Laterality: N/A;   CESAREAN SECTION N/A 04/15/2018   Procedure: CESAREAN SECTION;  Surgeon: Carrington Clamp, MD;  Location: [facility] BIRTHING SUIT[facility];  Service: Obstetrics;  Laterality: N/A;   KNEE ARTHROSCOPY Right 02/11/2017   Procedure: RIGHT KNEE ARTHROSCOPY WITH DEBRIDEMENT CHONDROMALACIA;  Surgeon: Gean Birchwood, MD;  Location: [facility];  Service: Orthopedics;  Laterality: Right;   Social History   Socioeconomic History   Marital status: Married    Spouse name: Jodilyn Giese   Number of children: Not on file   Years of education: Not on file   Highest education level: Not on file  Occupational History   Occupation: Pastor  Tobacco Use   Smoking status: Never   Smokeless tobacco: Never  Vaping Use   Vaping Use: Never used  Substance and Sexual Activity   Alcohol use: Yes    Comment: social   Drug use: No   Sexual activity: Yes    Birth control/protection: None  Other Topics Concern   Not on file  Social History Narrative   Not on file   Social Determinants of Health   Financial Resource Strain: Low Risk  (04/06/2018)   Overall Financial Resource Strain (CARDIA)    Difficulty of Paying Living Expenses: Not hard at all  Food Insecurity: No Food Insecurity (04/06/2018)   Hunger Vital Sign    Worried About Running Out of Food in the Last Year: Never true    Ran Out of Food in the Last Year: Never true  Transportation Needs: Unknown (04/06/2018)   PRAPARE - Administrator, Civil Service (Medical): No    Lack of Transportation (Non-Medical): Not on file  Physical Activity: Not on file  Stress: Stress Concern Present (04/06/2018)   [employer] of  Occupational Health - Occupational Stress Questionnaire    Feeling of Stress : Very much  Social Connections: Not on file   No Known Allergies Family History  Problem Relation Age of Onset   Hypertension Father    Hyperlipidemia Father    Heart disease Father    Cancer Maternal Grandfather    Heart disease Paternal Grandfather    Pulmonary embolism Mother    Alcohol abuse Mother     Current Outpatient Medications (Endocrine & Metabolic):    levonorgestrel (MIRENA) 20 MCG/24HR IUD, 1 each by Intrauterine route once.  Current Outpatient Medications (Cardiovascular):    nitroGLYCERIN (NITRO-DUR) 0.2 mg/hr patch, Apply 1/4 of  a patch to skin once daily.   spironolactone (ALDACTONE) 50 MG tablet, Take 1 tablet (50 mg total) by mouth daily.  Current Outpatient Medications (Respiratory):    cetirizine (ZYRTEC) 10 MG tablet, Take 10 mg by mouth daily.    Current Outpatient Medications (Other):    Lisdexamfetamine Dimesylate (VYVANSE) 60 MG CHEW, Chew 60 mg by mouth daily.   Lisdexamfetamine Dimesylate (VYVANSE) 60 MG CHEW, Chew 60 mg by mouth every morning.   Lisdexamfetamine Dimesylate (VYVANSE) 60 MG CHEW, Chew 60 mg by mouth every morning.   Multiple Vitamins-Minerals (WOMENS MULTI VITAMIN & MINERAL PO), Take 1 tablet by mouth daily.   Semaglutide-Weight Management (WEGOVY) 2.4 MG/0.75ML SOAJ, Inject 2.4 mg into the skin once a week.   venlafaxine XR (EFFEXOR XR) 37.5 MG 24 hr capsule, Take 1 capsule (37.5 mg total) by mouth daily with breakfast.   Vitamin D, Ergocalciferol, (DRISDOL) 1.25 MG (50000 UNIT) CAPS capsule, Take 1 capsule (50,000 Units total) by mouth every 7 (seven) days.   gabapentin (NEURONTIN) 100 MG capsule, TAKE 2 CAPSULES BY MOUTH AT BEDTIME.   Reviewed prior external information including notes and imaging from  primary care provider As well as notes that were available from care everywhere and other healthcare systems.  Past medical history, social, surgical and family history all reviewed in electronic medical record.  No pertanent information unless stated regarding to the chief complaint.   Review of Systems:  No headache, visual changes, nausea, vomiting, diarrhea, constipation, dizziness, abdominal pain, skin rash, fevers, chills, night sweats, weight loss, swollen lymph nodes, body aches, joint swelling, chest pain, shortness of breath, mood changes. POSITIVE muscle aches  Objective  Blood pressure 120/72, pulse 90, height 5\' 6"  (1.676 m), weight 231 lb (104.8 kg), SpO2 98 %, unknown if currently breastfeeding.   General: No apparent distress alert and oriented x3 mood and affect  normal, dressed appropriately.  HEENT: Pupils equal, extraocular movements intact  Respiratory: Patient's speak in full sentences and does not appear short of breath  Cardiovascular: No lower extremity edema, non tender, no erythema  Left elbow exam does have some tenderness to palpation noted over the lateral epicondylar region.  Patient does have some tenderness at the wrist as well.  Positive Tinel's at the carpal tunnel.  Limited muscular skeletal ultrasound was performed and interpreted by , M  Limited ultrasound does show still some mild increase in neovascularization near the insertion of the common extensor tendon.  Patient does have an area of hypoechoic changes of the deep fibers. Impression: Interval improvement    Impression and Recommendations:

## 2021-11-19 ENCOUNTER — Ambulatory Visit: Payer: BC Managed Care – PPO | Admitting: Family Medicine

## 2021-11-19 ENCOUNTER — Ambulatory Visit: Payer: Self-pay

## 2021-11-19 ENCOUNTER — Encounter: Payer: Self-pay | Admitting: Family Medicine

## 2021-11-19 VITALS — BP 120/72 | HR 90 | Ht 66.0 in | Wt 231.0 lb

## 2021-11-19 DIAGNOSIS — M25522 Pain in left elbow: Secondary | ICD-10-CM

## 2021-11-19 DIAGNOSIS — G5602 Carpal tunnel syndrome, left upper limb: Secondary | ICD-10-CM | POA: Diagnosis not present

## 2021-11-19 DIAGNOSIS — M7712 Lateral epicondylitis, left elbow: Secondary | ICD-10-CM

## 2021-11-19 NOTE — Assessment & Plan Note (Signed)
Patient does have still some mild deep tendon fibers that seem to still have some tearing noted.  We discussed with patient about icing regimen and home exercises otherwise.  Increase activity slowly otherwise.  Follow-up again as needed.  Did discuss with patient that if worsening symptoms should consider even a possible carpal tunnel injection.

## 2021-11-19 NOTE — Assessment & Plan Note (Signed)
Worse at the moment.  Discussed the possibility of injection which patient declined.  Follow-up with me again as needed for this problem

## 2021-12-31 DIAGNOSIS — F5081 Binge eating disorder: Secondary | ICD-10-CM | POA: Diagnosis not present

## 2021-12-31 DIAGNOSIS — F3289 Other specified depressive episodes: Secondary | ICD-10-CM | POA: Diagnosis not present

## 2021-12-31 DIAGNOSIS — G4733 Obstructive sleep apnea (adult) (pediatric): Secondary | ICD-10-CM | POA: Diagnosis not present

## 2022-01-27 DIAGNOSIS — I10 Essential (primary) hypertension: Secondary | ICD-10-CM | POA: Diagnosis not present

## 2022-01-27 DIAGNOSIS — E559 Vitamin D deficiency, unspecified: Secondary | ICD-10-CM | POA: Diagnosis not present

## 2022-01-27 DIAGNOSIS — R632 Polyphagia: Secondary | ICD-10-CM | POA: Diagnosis not present

## 2022-01-27 DIAGNOSIS — F3289 Other specified depressive episodes: Secondary | ICD-10-CM | POA: Diagnosis not present

## 2022-02-05 NOTE — Progress Notes (Signed)
[name] [address] [place] [address] Phone: [phone] Subjective:   Katie Glass, am serving as a scribe for Dr. Antoine Primas.  I'm seeing this patient by the request  of:  [name], MD  CC: Bilateral wrist pain  UKG:[id number]  11/19/2021 Worse at the moment.  Discussed the possibility of injection which patient declined.  Follow-up with me again as needed for this problem     Patient does have still some mild deep tendon fibers that seem to still have some tearing noted.  We discussed with patient about icing regimen and home exercises otherwise.  Increase activity slowly otherwise.  Follow-up again as needed.  Did discuss with patient that if worsening symptoms should consider even a possible carpal tunnel injection.      Update 02/07/2022 Katie Glass is a 46 y.o. female coming in with complaint of L elbow and B wrist pain. Patient states that she would like injections in wrists today. Pain is not at its worst today but does have intermittent numbness more often than not.   L elbow pain has improved. Pain still felt intermittently with no pattern.       Past Medical History:  Diagnosis Date   Arthritis    knees   Asthma    as a child   Back pain    Food allergy    Tannin/sulfate in Wine and ciders, rash on face and hands   Joint pain    Lower extremity edema    MMT (medial meniscus tear)    right   Sleep apnea    CPAP nightly   Umbilical hernia    Past Surgical History:  Procedure Laterality Date   BILATERAL SALPINGECTOMY Bilateral 04/15/2018   Procedure: BILATERAL SALPINGECTOMY;  Surgeon: Carrington Clamp, MD;  Location: [facility]THING [facility];  Service: Obstetrics;  Laterality: Bilateral;   CESAREAN SECTION  11/12/2010   Procedure: CESAREAN SECTION;  Surgeon: Almon Hercules;  Location: [facility];  Service: Gynecology;  Laterality: N/A;   CESAREAN SECTION N/A 04/15/2018   Procedure: CESAREAN SECTION;   Surgeon: Carrington Clamp, MD;  Location: [facility]TH[facility];  Service: Obstetrics;  Laterality: N/A;   KNEE ARTHROSCOPY Right 02/11/2017   Procedure: RIGHT KNEE ARTHROSCOPY WITH DEBRIDEMENT CHONDROMALACIA;  Surgeon: Gean Birchwood, MD;  Location: [facility];  Service: Orthopedics;  Laterality: Right;   Social History   Socioeconomic History   Marital status: Married    Spouse name: Graciemae Delisle   Number of children: Not on file   Years of education: Not on file   Highest education level: Not on file  Occupational History   Occupation: Pastor  Tobacco Use   Smoking status: Never   Smokeless tobacco: Never  Vaping Use   Vaping Use: Never used  Substance and Sexual Activity   Alcohol use: Yes    Comment: social   Drug use: No   Sexual activity: Yes    Birth control/protection: None  Other Topics Concern   Not on file  Social History Narrative   Not on file   Social Determinants of Health   Financial Resource Strain: Low Risk  (04/06/2018)   Overall Financial Resource Strain (CARDIA)    Difficulty of Paying Living Expenses: Not hard at all  Food Insecurity: No Food Insecurity (04/06/2018)   Hunger Vital Sign    Worried About Running Out of Food in the Last Year: Never true    Ran Out of Food in  the Last Year: Never true  Transportation Needs: Unknown (04/06/2018)   PRAPARE - Administrator, Civil Service (Medical): No    Lack of Transportation (Non-Medical): Not on file  Physical Activity: Not on file  Stress: Stress Concern Present (04/06/2018)   [employer] of Occupational Health - Occupational Stress Questionnaire    Feeling of Stress : Very much  Social Connections: Not on file   No Known Allergies Family History  Problem Relation Age of Onset   Hypertension Father    Hyperlipidemia Father    Heart disease Father    Cancer Maternal Grandfather    Heart disease Paternal Grandfather    Pulmonary embolism Mother    Alcohol abuse  Mother     Current Outpatient Medications (Endocrine & Metabolic):    levonorgestrel (MIRENA) 20 MCG/24HR IUD, 1 each by Intrauterine route once.  Current Outpatient Medications (Cardiovascular):    nitroGLYCERIN (NITRO-DUR) 0.2 mg/hr patch, Apply 1/4 of a patch to skin once daily.   spironolactone (ALDACTONE) 50 MG tablet, Take 1 tablet (50 mg total) by mouth daily.  Current Outpatient Medications (Respiratory):    cetirizine (ZYRTEC) 10 MG tablet, Take 10 mg by mouth daily.    Current Outpatient Medications (Other):    Lisdexamfetamine Dimesylate (VYVANSE) 60 MG CHEW, Chew 60 mg by mouth daily.   Lisdexamfetamine Dimesylate (VYVANSE) 60 MG CHEW, Chew 60 mg by mouth every morning.   Lisdexamfetamine Dimesylate (VYVANSE) 60 MG CHEW, Chew 60 mg by mouth every morning.   Multiple Vitamins-Minerals (WOMENS MULTI VITAMIN & MINERAL PO), Take 1 tablet by mouth daily.   Semaglutide-Weight Management (WEGOVY) 2.4 MG/0.75ML SOAJ, Inject 2.4 mg into the skin once a week.   venlafaxine XR (EFFEXOR XR) 37.5 MG 24 hr capsule, Take 1 capsule (37.5 mg total) by mouth daily with breakfast.   Vitamin D, Ergocalciferol, (DRISDOL) 1.25 MG (50000 UNIT) CAPS capsule, Take 1 capsule (50,000 Units total) by mouth every 7 (seven) days.   gabapentin (NEURONTIN) 100 MG capsule, TAKE 2 CAPSULES BY MOUTH AT BEDTIME.   Reviewed prior external information including notes and imaging from  primary care provider As well as notes that were available from care everywhere and other healthcare systems.  Past medical history, social, surgical and family history all reviewed in electronic medical record.  No pertanent information unless stated regarding to the chief complaint.   Review of Systems:  No headache, visual changes, nausea, vomiting, diarrhea, constipation, dizziness, abdominal pain, skin rash, fevers, chills, night sweats, weight loss, swollen lymph nodes, body aches, joint swelling, chest pain, shortness of  breath, mood changes. POSITIVE muscle aches  Objective  Blood pressure 110/84, pulse (!) 101, height 5\' 6"  (1.676 m), weight 230 lb (104.3 kg), SpO2 96 %, unknown if currently breastfeeding.   General: No apparent distress alert and oriented x3 mood and affect normal, dressed appropriately.  HEENT: Pupils equal, extraocular movements intact  Respiratory: Patient's speak in full sentences and does not appear short of breath  Cardiovascular: No lower extremity edema, non tender, no erythema  Bilateral wrist exam show the patient does have positive Tinel's noted.  Procedure: Real-time Ultrasound Guided Injection of left carpal tunnel Device: GE Logiq Q7 Ultrasound guided injection is preferred based studies that show increased duration, increased effect, greater accuracy, decreased procedural pain, increased response rate with ultrasound guided versus blind injection.  Verbal informed consent obtained.  Time-out conducted.  Noted no overlying erythema, induration, or other signs of local infection.  Skin prepped in a  sterile fashion.  Local anesthesia: Topical Ethyl chloride.  With sterile technique and under real time ultrasound guidance:  median nerve visualized.  23g 5/8 inch needle inserted distal to proximal approach into nerve sheath. Pictures taken nfor needle placement. Patient did have injection of 0.5 cc of 0.5% Marcaine, and 0.5 cc of Kenalog 40 mg/dL. Completed without difficulty  Pain immediately resolved suggesting accurate placement of the medication.  Advised to call if fevers/chills, erythema, induration, drainage, or persistent bleeding.  Impression: Technically successful ultrasound guided injection.     Impression and Recommendations:     The above documentation has been reviewed and is accurate and complete Judi Saa, DO

## 2022-02-07 ENCOUNTER — Ambulatory Visit: Payer: Self-pay

## 2022-02-07 ENCOUNTER — Encounter: Payer: Self-pay | Admitting: Family Medicine

## 2022-02-07 ENCOUNTER — Ambulatory Visit: Payer: BC Managed Care – PPO | Admitting: Family Medicine

## 2022-02-07 VITALS — BP 110/84 | HR 101 | Ht 66.0 in | Wt 230.0 lb

## 2022-02-07 DIAGNOSIS — M25531 Pain in right wrist: Secondary | ICD-10-CM

## 2022-02-07 DIAGNOSIS — G5602 Carpal tunnel syndrome, left upper limb: Secondary | ICD-10-CM | POA: Diagnosis not present

## 2022-02-07 DIAGNOSIS — M25532 Pain in left wrist: Secondary | ICD-10-CM | POA: Diagnosis not present

## 2022-02-07 NOTE — Progress Notes (Signed)
[name] [address] [place] [address] Phone: [phone] Subjective:   [name], am serving as a scribe for Dr. Antoine Primas. I'm seeing this patient by the request  of:  [name], MD  CC: Right wrist pain  SEG:[id number]  Katie Glass is a 46 y.o. female coming in with complaint of wrist pain. Here for injection she thinks.  Patient did have her left carpal tunnel injected which has made significant improvement but states now she is much more aware of the right side.  Affecting daily activities and does have almost chronic numbness at this moment.    Past Medical History:  Diagnosis Date   Arthritis    knees   Asthma    as a child   Back pain    Food allergy    Tannin/sulfate in Wine and ciders, rash on face and hands   Joint pain    Lower extremity edema    MMT (medial meniscus tear)    right   Sleep apnea    CPAP nightly   Umbilical hernia    Past Surgical History:  Procedure Laterality Date   BILATERAL SALPINGECTOMY Bilateral 04/15/2018   Procedure: BILATERAL SALPINGECTOMY;  Surgeon: Carrington Clamp, MD;  Location: [facility];  Service: Obstetrics;  Laterality: Bilateral;   CESAREAN SECTION  11/12/2010   Procedure: CESAREAN SECTION;  Surgeon: Almon Hercules;  Location: [facility];  Service: Gynecology;  Laterality: N/A;   CESAREAN SECTION N/A 04/15/2018   Procedure: CESAREAN SECTION;  Surgeon: Carrington Clamp, MD;  Location: [facility]TH[facility];  Service: Obstetrics;  Laterality: N/A;   KNEE ARTHROSCOPY Right 02/11/2017   Procedure: RIGHT KNEE ARTHROSCOPY WITH DEBRIDEMENT CHONDROMALACIA;  Surgeon: Gean Birchwood, MD;  Location: [facility];  Service: Orthopedics;  Laterality: Right;   Social History   Socioeconomic History   Marital status: Married    Spouse name: Latifah Padin   Number of children: Not on file   Years of education: Not on file   Highest education level: Not on file   Occupational History   Occupation: Pastor  Tobacco Use   Smoking status: Never   Smokeless tobacco: Never  Vaping Use   Vaping Use: Never used  Substance and Sexual Activity   Alcohol use: Yes    Comment: social   Drug use: No   Sexual activity: Yes    Birth control/protection: None  Other Topics Concern   Not on file  Social History Narrative   Not on file   Social Determinants of Health   Financial Resource Strain: Low Risk  (04/06/2018)   Overall Financial Resource Strain (CARDIA)    Difficulty of Paying Living Expenses: Not hard at all  Food Insecurity: No Food Insecurity (04/06/2018)   Hunger Vital Sign    Worried About Running Out of Food in the Last Year: Never true    Ran Out of Food in the Last Year: Never true  Transportation Needs: Unknown (04/06/2018)   PRAPARE - Administrator, Civil Service (Medical): No    Lack of Transportation (Non-Medical): Not on file  Physical Activity: Not on file  Stress: Stress Concern Present (04/06/2018)   [employer] of Occupational Health - Occupational Stress Questionnaire    Feeling of Stress : Very much  Social Connections: Not on file   No Known Allergies Family History  Problem Relation Age of Onset   Hypertension Father    Hyperlipidemia Father  Heart disease Father    Cancer Maternal Grandfather    Heart disease Paternal Grandfather    Pulmonary embolism Mother    Alcohol abuse Mother     Current Outpatient Medications (Endocrine & Metabolic):    levonorgestrel (MIRENA) 20 MCG/24HR IUD, 1 each by Intrauterine route once.  Current Outpatient Medications (Cardiovascular):    nitroGLYCERIN (NITRO-DUR) 0.2 mg/hr patch, Apply 1/4 of a patch to skin once daily.   spironolactone (ALDACTONE) 50 MG tablet, Take 1 tablet (50 mg total) by mouth daily.  Current Outpatient Medications (Respiratory):    cetirizine (ZYRTEC) 10 MG tablet, Take 10 mg by mouth daily.    Current Outpatient Medications  (Other):    gabapentin (NEURONTIN) 100 MG capsule, TAKE 2 CAPSULES BY MOUTH AT BEDTIME.   Lisdexamfetamine Dimesylate (VYVANSE) 60 MG CHEW, Chew 60 mg by mouth daily.   Lisdexamfetamine Dimesylate (VYVANSE) 60 MG CHEW, Chew 60 mg by mouth every morning.   Lisdexamfetamine Dimesylate (VYVANSE) 60 MG CHEW, Chew 60 mg by mouth every morning.   Multiple Vitamins-Minerals (WOMENS MULTI VITAMIN & MINERAL PO), Take 1 tablet by mouth daily.   Semaglutide-Weight Management (WEGOVY) 2.4 MG/0.75ML SOAJ, Inject 2.4 mg into the skin once a week.   venlafaxine XR (EFFEXOR XR) 37.5 MG 24 hr capsule, Take 1 capsule (37.5 mg total) by mouth daily with breakfast.   Vitamin D, Ergocalciferol, (DRISDOL) 1.25 MG (50000 UNIT) CAPS capsule, Take 1 capsule (50,000 Units total) by mouth every 7 (seven) days.   Reviewed prior external information including notes and imaging from  primary care provider As well as notes that were available from care everywhere and other healthcare systems.  Past medical history, social, surgical and family history all reviewed in electronic medical record.  No pertanent information unless stated regarding to the chief complaint.   Review of Systems:  No headache, visual changes, nausea, vomiting, diarrhea, constipation, dizziness, abdominal pain, skin rash, fevers, chills, night sweats, weight loss, swollen lymph nodes, body aches, joint swelling, chest pain, shortness of breath, mood changes. POSITIVE muscle aches  Objective  Blood pressure (!) 122/94, pulse (!) 102, height 5\' 6"  (1.676 m), SpO2 98 %, unknown if currently breastfeeding.   General: No apparent distress alert and oriented x3 mood and affect normal, dressed appropriately.  HEENT: Pupils equal, extraocular movements intact  Respiratory: Patient's speak in full sentences and does not appear short of breath  Cardiovascular: No lower extremity edema, non tender, no erythema  Right wrist exam does have a positive Tinel's  noted.  Good grip strength though noted.  Good range of motion.   Procedure: Real-time Ultrasound Guided Injection of right carpal tunnel Device: GE Logiq Q7  Ultrasound guided injection is preferred based studies that show increased duration, increased effect, greater accuracy, decreased procedural pain, increased response rate with ultrasound guided versus blind injection.  Verbal informed consent obtained.  Time-out conducted.  Noted no overlying erythema, induration, or other signs of local infection.  Skin prepped in a sterile fashion.  Local anesthesia: Topical Ethyl chloride.  With sterile technique and under real time ultrasound guidance:  median nerve visualized.  23g 5/8 inch needle inserted distal to proximal approach into nerve sheath. Pictures taken nfor needle placement. Patient did have injection of 0.5 cc of 0.5% Marcaine, and 0.5 cc of Kenalog 40 mg/dL. Completed without difficulty  Pain immediately resolved suggesting accurate placement of the medication.  Advised to call if fevers/chills, erythema, induration, drainage, or persistent bleeding.  Impression: Technically successful ultrasound guided injection.  Impression and Recommendations:     The above documentation has been reviewed and is accurate and complete Lyndal Pulley, DO

## 2022-02-07 NOTE — Patient Instructions (Signed)
Go paw patrol! You are going to do well;  Ice in 6 hours.  See you 730 on Wednesday for the other side! (Have them schedule up front)

## 2022-02-07 NOTE — Assessment & Plan Note (Signed)
Left-sided injected today.  Patient has had chronic difficulties on the right side as well.  Because patient is driving did not want to do both of them at the same time.  We will have patient follow-up with me again next week for and potentially consider an injection on the contralateral side.  Patient will do the bracing and the home exercises otherwise.

## 2022-02-12 ENCOUNTER — Ambulatory Visit: Payer: Self-pay

## 2022-02-12 ENCOUNTER — Encounter: Payer: Self-pay | Admitting: Family Medicine

## 2022-02-12 ENCOUNTER — Ambulatory Visit: Payer: BC Managed Care – PPO | Admitting: Family Medicine

## 2022-02-12 VITALS — BP 122/94 | HR 102 | Ht 66.0 in

## 2022-02-12 DIAGNOSIS — G5601 Carpal tunnel syndrome, right upper limb: Secondary | ICD-10-CM | POA: Diagnosis not present

## 2022-02-12 DIAGNOSIS — M25531 Pain in right wrist: Secondary | ICD-10-CM

## 2022-02-12 NOTE — Assessment & Plan Note (Signed)
Patient given an injection today.  This is the contralateral side compared to the previous side.  We discussed with patient about icing regimen, bracing, topical laboratories.  Could consider the possibility of gabapentin.  Follow-up again in 6 to 8 weeks

## 2022-02-17 ENCOUNTER — Other Ambulatory Visit: Payer: Self-pay

## 2022-02-17 DIAGNOSIS — M859 Disorder of bone density and structure, unspecified: Secondary | ICD-10-CM

## 2022-02-24 DIAGNOSIS — R632 Polyphagia: Secondary | ICD-10-CM | POA: Diagnosis not present

## 2022-02-24 DIAGNOSIS — I10 Essential (primary) hypertension: Secondary | ICD-10-CM | POA: Diagnosis not present

## 2022-02-24 DIAGNOSIS — R051 Acute cough: Secondary | ICD-10-CM | POA: Diagnosis not present

## 2022-02-24 DIAGNOSIS — E559 Vitamin D deficiency, unspecified: Secondary | ICD-10-CM | POA: Diagnosis not present

## 2022-02-25 ENCOUNTER — Ambulatory Visit (INDEPENDENT_AMBULATORY_CARE_PROVIDER_SITE_OTHER)
Admission: RE | Admit: 2022-02-25 | Discharge: 2022-02-25 | Disposition: A | Discharge status: Home / Self Care | Payer: BC Managed Care – PPO | Source: Ambulatory Visit

## 2022-02-25 DIAGNOSIS — M859 Disorder of bone density and structure, unspecified: Secondary | ICD-10-CM

## 2022-02-27 ENCOUNTER — Ambulatory Visit: Payer: BC Managed Care – PPO | Admitting: Family Medicine

## 2022-03-03 DIAGNOSIS — G4733 Obstructive sleep apnea (adult) (pediatric): Secondary | ICD-10-CM | POA: Diagnosis not present

## 2022-03-31 DIAGNOSIS — F908 Attention-deficit hyperactivity disorder, other type: Secondary | ICD-10-CM | POA: Diagnosis not present

## 2022-03-31 DIAGNOSIS — E559 Vitamin D deficiency, unspecified: Secondary | ICD-10-CM | POA: Diagnosis not present

## 2022-03-31 DIAGNOSIS — F5081 Binge eating disorder: Secondary | ICD-10-CM | POA: Diagnosis not present

## 2022-04-28 DIAGNOSIS — M2550 Pain in unspecified joint: Secondary | ICD-10-CM | POA: Diagnosis not present

## 2022-04-28 DIAGNOSIS — F909 Attention-deficit hyperactivity disorder, unspecified type: Secondary | ICD-10-CM | POA: Diagnosis not present

## 2022-04-28 DIAGNOSIS — E559 Vitamin D deficiency, unspecified: Secondary | ICD-10-CM | POA: Diagnosis not present

## 2022-04-28 DIAGNOSIS — G4733 Obstructive sleep apnea (adult) (pediatric): Secondary | ICD-10-CM | POA: Diagnosis not present

## 2022-04-29 NOTE — Progress Notes (Unsigned)
Katie [name] Phone: [phone] Subjective:   Katie [name], am serving as a scribe for Dr. Hulan Saas.  I'm seeing this patient by the request  of:  Antony Contras, MD  CC: left foot pain   GLO:[id number]  Katie [name] is a 47 y.o. female coming in with complaint of Left foot pain. Previous seen in 2023 for left and right foot pain. Pain over the 3rd metacarpal for past 4 weeks. [name] pattern to her pain. Pain over dorsal aspect but never underneath the foot.   L groin pain. Pain is located to the left side of pubic symphysis that radiates down the inside of leg. [name] hx of injury.   L sided trap pain from ADLs. Denies any radiating symptoms.   Carpal tunnel has improved since injections. L remains worse than R.      Past Medical History:  Diagnosis Date   Arthritis    knees   Asthma    as a child   Back pain    Food allergy    Tannin/sulfate in Wine and ciders, rash on face and hands   Joint pain    Lower extremity edema    MMT (medial meniscus tear)    right   Sleep apnea    CPAP nightly   Umbilical hernia    Past Surgical History:  Procedure Laterality Date   BILATERAL SALPINGECTOMY Bilateral 04/15/2018   Procedure: BILATERAL SALPINGECTOMY;  Surgeon: Bobbye Charleston, MD;  Location: [place];  Service: Obstetrics;  Laterality: Bilateral;   CESAREAN SECTION  11/12/2010   Procedure: CESAREAN SECTION;  Surgeon: Marcial Pacas;  Location: [facility];  Service: Gynecology;  Laterality: N/A;   CESAREAN SECTION N/A 04/15/2018   Procedure: CESAREAN SECTION;  Surgeon: Bobbye Charleston, MD;  Location: [place];  Service: Obstetrics;  Laterality: N/A;   KNEE ARTHROSCOPY Right 02/11/2017   Procedure: RIGHT KNEE ARTHROSCOPY WITH DEBRIDEMENT CHONDROMALACIA;  Surgeon: Frederik Pear, MD;  Location: [facility];  Service: Orthopedics;  Laterality: Right;   Social History    Socioeconomic History   Marital status: Married    Spouse name: Lauris Keepers   Number of children: Not on file   Years of education: Not on file   Highest education level: Not on file  Occupational History   Occupation: Pastor  Tobacco Use   Smoking status: Never   Smokeless tobacco: Never  Vaping Use   Vaping Use: Never used  Substance and Sexual Activity   Alcohol use: Yes    Comment: social   Drug use: [name]   Sexual activity: Yes    Birth control/protection: None  Other Topics Concern   Not on file  Social History Narrative   Not on file   Social Determinants of Health   Financial Resource Strain: Low Risk  (04/06/2018)   Overall Financial Resource Strain (CARDIA)    Difficulty of Paying Living Expenses: Not hard at all  Food Insecurity: [name] Food Insecurity (04/06/2018)   Hunger Vital Sign    Worried About Running Out of Food in the Last Year: Never true    [place] in the Last Year: Never true  Transportation Needs: Unknown (04/06/2018)   PRAPARE - Hydrologist (Medical): [name]    Lack of Transportation (Non-Medical): Not on file  Physical Activity: Not on file  Stress: Stress Concern Present (04/06/2018)   [employer] of  Occupational Health - Occupational Stress Questionnaire    Feeling of Stress : Very much  Social Connections: Not on file   [name] Known Allergies Family History  Problem Relation Age of Onset   Hypertension Father    Hyperlipidemia Father    Heart disease Father    Cancer Maternal Grandfather    Heart disease Paternal Grandfather    Pulmonary embolism Mother    Alcohol abuse Mother     Current Outpatient Medications (Endocrine & Metabolic):    levonorgestrel (MIRENA) 20 MCG/24HR IUD, 1 each by Intrauterine route once.  Current Outpatient Medications (Cardiovascular):    nitroGLYCERIN (NITRO-DUR) 0.2 mg/hr patch, Apply 1/4 of a patch to skin once daily.   spironolactone (ALDACTONE) 50 MG tablet, Take 1  tablet (50 mg total) by mouth daily.  Current Outpatient Medications (Respiratory):    cetirizine (ZYRTEC) 10 MG tablet, Take 10 mg by mouth daily.    Current Outpatient Medications (Other):    Lisdexamfetamine Dimesylate (VYVANSE) 60 MG CHEW, Chew 60 mg by mouth daily.   Lisdexamfetamine Dimesylate (VYVANSE) 60 MG CHEW, Chew 60 mg by mouth every morning.   Lisdexamfetamine Dimesylate (VYVANSE) 60 MG CHEW, Chew 60 mg by mouth every morning.   Multiple Vitamins-Minerals (WOMENS MULTI VITAMIN & MINERAL PO), Take 1 tablet by mouth daily.   Semaglutide-Weight Management (WEGOVY) 2.4 MG/0.75ML SOAJ, Inject 2.4 mg into the skin once a week.   venlafaxine XR (EFFEXOR XR) 37.5 MG 24 hr capsule, Take 1 capsule (37.5 mg total) by mouth daily with breakfast.   Vitamin D, Ergocalciferol, (DRISDOL) 1.25 MG (50000 UNIT) CAPS capsule, Take 1 capsule (50,000 Units total) by mouth every 7 (seven) days.   Reviewed prior external information including notes and imaging from  primary care provider As well as notes that were available from care everywhere and other healthcare systems.  Past medical history, social, surgical and family history all reviewed in electronic medical record.  [name] pertanent information unless stated regarding to the chief complaint.   Review of Systems:  [name] headache, visual changes, nausea, vomiting, diarrhea, constipation, dizziness, abdominal pain, skin rash, fevers, chills, night sweats, weight loss, swollen lymph nodes, body aches, joint swelling, chest pain, shortness of breath, mood changes. POSITIVE muscle aches  Objective  Blood pressure 122/84, pulse 89, height 5\' 6"  (1.676 m), weight 224 lb (101.6 kg), SpO2 97 %, unknown if currently breastfeeding.   General: [name] apparent distress alert and oriented x3 mood and affect normal, dressed appropriately.  HEENT: Pupils equal, extraocular movements intact  Respiratory: Patient's speak in full sentences and does not appear short  of breath  Cardiovascular: [name] lower extremity edema, non tender, [name] erythema  Left foot exam shows the patient is tender to palpation more over the dorsal aspect of the foot.  Patient has relatively good range of motion of the ankle.  Nontender on the exam today.  Low back exam does have some loss lordosis.  Some tenderness to palpation more over the left sacroiliac joint.  Patient does have tenderness with the abductor muscle  Limited muscular skeletal ultrasound was performed and interpreted by Hulan Saas, M  Limited ultrasound of patient's left lower extremity still some mild arthritic changes but [name] true stress reaction reported at the moment.  Mild increasing in  Doppler flow. Impression: Some midfoot arthritis with some mild nonspecific inflammation    Impression and Recommendations:     The above documentation has been reviewed and is accurate and complete Lyndal Pulley, DO

## 2022-04-30 ENCOUNTER — Encounter: Payer: Self-pay | Admitting: Family Medicine

## 2022-04-30 ENCOUNTER — Ambulatory Visit: Payer: BC Managed Care – PPO | Admitting: Family Medicine

## 2022-04-30 ENCOUNTER — Ambulatory Visit: Payer: Self-pay

## 2022-04-30 VITALS — BP 122/84 | HR 89 | Ht 66.0 in | Wt 224.0 lb

## 2022-04-30 DIAGNOSIS — M84375G Stress fracture, left foot, subsequent encounter for fracture with delayed healing: Secondary | ICD-10-CM

## 2022-04-30 DIAGNOSIS — M76892 Other specified enthesopathies of left lower limb, excluding foot: Secondary | ICD-10-CM

## 2022-04-30 DIAGNOSIS — M79671 Pain in right foot: Secondary | ICD-10-CM

## 2022-04-30 NOTE — Assessment & Plan Note (Signed)
Abductor muscle injury.  Some mild home exercises given.  Attempted osteopathic manipulation with some improvement of the sacroiliac joint.  Follow-up again in 6 weeks

## 2022-04-30 NOTE — Patient Instructions (Signed)
Good to see you Wear shoes in the house Continue Vit D  Hip: adductor stretches  See me in 6-8 weeks

## 2022-04-30 NOTE — Assessment & Plan Note (Signed)
Mild increase in inflammation  Discussed HEP  Discussed which activities to do and which ones to avoid.  We discussed avoiding being barefoot.

## 2022-05-08 ENCOUNTER — Ambulatory Visit: Payer: BC Managed Care – PPO | Admitting: Family Medicine

## 2022-06-02 DIAGNOSIS — E559 Vitamin D deficiency, unspecified: Secondary | ICD-10-CM | POA: Diagnosis not present

## 2022-06-02 DIAGNOSIS — F909 Attention-deficit hyperactivity disorder, unspecified type: Secondary | ICD-10-CM | POA: Diagnosis not present

## 2022-06-02 DIAGNOSIS — Z6836 Body mass index (BMI) 36.0-36.9, adult: Secondary | ICD-10-CM | POA: Diagnosis not present

## 2022-06-02 DIAGNOSIS — F418 Other specified anxiety disorders: Secondary | ICD-10-CM | POA: Diagnosis not present

## 2022-06-12 NOTE — Progress Notes (Signed)
[facility] [place] [address] [place] Phone: [phone] Subjective:   [name], am serving as a scribe for Dr. Hulan Saas.  I'm seeing this patient by the request  of:  Antony Contras, MD  CC: Foot pain, neck pain  [id number]  04/30/2022 Abductor muscle injury.  Some mild home exercises given.  Attempted osteopathic manipulation with some improvement of the sacroiliac joint.  Follow-up again in 6 weeks     Update 06/24/2022 Katie Glass is a 47 y.o. female coming in with complaint of R foot and L hip pain. Patient states now having left mid foot pain and L neck pain. Hips are doing better, but very on and off. No other concerns.      Past Medical History:  Diagnosis Date   Arthritis    knees   Asthma    as a child   Back pain    Food allergy    Tannin/sulfate in Wine and ciders, rash on face and hands   Joint pain    Lower extremity edema    MMT (medial meniscus tear)    right   Sleep apnea    CPAP nightly   Umbilical hernia    Past Surgical History:  Procedure Laterality Date   BILATERAL SALPINGECTOMY Bilateral 04/15/2018   Procedure: BILATERAL SALPINGECTOMY;  Surgeon: Bobbye Charleston, MD;  Location: [facility];  Service: Obstetrics;  Laterality: Bilateral;   CESAREAN SECTION  11/12/2010   Procedure: CESAREAN SECTION;  Surgeon: Marcial Pacas;  Location: [facility];  Service: Gynecology;  Laterality: N/A;   CESAREAN SECTION N/A 04/15/2018   Procedure: CESAREAN SECTION;  Surgeon: Bobbye Charleston, MD;  Location: [facility];  Service: Obstetrics;  Laterality: N/A;   KNEE ARTHROSCOPY Right 02/11/2017   Procedure: RIGHT KNEE ARTHROSCOPY WITH DEBRIDEMENT CHONDROMALACIA;  Surgeon: Frederik Pear, MD;  Location: [facility];  Service: Orthopedics;  Laterality: Right;   Social History   Socioeconomic History   Marital status: Married    Spouse name: Davey Mullenax   Number of children:  Not on file   Years of education: Not on file   Highest education level: Not on file  Occupational History   Occupation: Pastor  Tobacco Use   Smoking status: Never   Smokeless tobacco: Never  Vaping Use   Vaping Use: Never used  Substance and Sexual Activity   Alcohol use: Yes    Comment: social   Drug use: No   Sexual activity: Yes    Birth control/protection: None  Other Topics Concern   Not on file  Social History Narrative   Not on file   Social Determinants of Health   Financial Resource Strain: Low Risk  (04/06/2018)   Overall Financial Resource Strain (CARDIA)    Difficulty of Paying Living Expenses: Not hard at all  Food Insecurity: No Food Insecurity (04/06/2018)   Hunger Vital Sign    Worried About Running Out of Food in the Last Year: Never true    [place] in the Last Year: Never true  Transportation Needs: Unknown (04/06/2018)   PRAPARE - Hydrologist (Medical): No    Lack of Transportation (Non-Medical): Not on file  Physical Activity: Not on file  Stress: Stress Concern Present (04/06/2018)   [place]    Feeling of Stress : Very much  Social Connections: Not on file   No  Known Allergies Family History  Problem Relation Age of Onset   Hypertension Father    Hyperlipidemia Father    Heart disease Father    Cancer Maternal Grandfather    Heart disease Paternal Grandfather    Pulmonary embolism Mother    Alcohol abuse Mother     Current Outpatient Medications (Endocrine & Metabolic):    levonorgestrel (MIRENA) 20 MCG/24HR IUD, 1 each by Intrauterine route once.  Current Outpatient Medications (Cardiovascular):    nitroGLYCERIN (NITRO-DUR) 0.2 mg/hr patch, Apply 1/4 of a patch to skin once daily.   spironolactone (ALDACTONE) 50 MG tablet, Take 1 tablet (50 mg total) by mouth daily.  Current Outpatient Medications (Respiratory):    cetirizine  (ZYRTEC) 10 MG tablet, Take 10 mg by mouth daily.  Current Outpatient Medications (Analgesics):    meloxicam (MOBIC) 15 MG tablet, Take 1 tablet (15 mg total) by mouth daily.   Current Outpatient Medications (Other):    Lisdexamfetamine Dimesylate (VYVANSE) 60 MG CHEW, Chew 60 mg by mouth daily.   Lisdexamfetamine Dimesylate (VYVANSE) 60 MG CHEW, Chew 60 mg by mouth every morning.   Lisdexamfetamine Dimesylate (VYVANSE) 60 MG CHEW, Chew 60 mg by mouth every morning.   Multiple Vitamins-Minerals (WOMENS MULTI VITAMIN & MINERAL PO), Take 1 tablet by mouth daily.   Semaglutide-Weight Management (WEGOVY) 2.4 MG/0.75ML SOAJ, Inject 2.4 mg into the skin once a week.   venlafaxine XR (EFFEXOR XR) 37.5 MG 24 hr capsule, Take 1 capsule (37.5 mg total) by mouth daily with breakfast.   Vitamin D, Ergocalciferol, (DRISDOL) 1.25 MG (50000 UNIT) CAPS capsule, Take 1 capsule (50,000 Units total) by mouth every 7 (seven) days.   Reviewed prior external information including notes and imaging from  primary care provider As well as notes that were available from care everywhere and other healthcare systems.  Past medical history, social, surgical and family history all reviewed in electronic medical record.  No pertanent information unless stated regarding to the chief complaint.   Review of Systems:  No headache, visual changes, nausea, vomiting, diarrhea, constipation, dizziness, abdominal pain, skin rash, fevers, chills, night sweats, weight loss, swollen lymph nodes, body aches, joint swelling, chest pain, shortness of breath, mood changes. POSITIVE muscle aches  Objective  Blood pressure 130/84, pulse 100, height 5\' 6"  (1.676 m), weight 227 lb (103 kg), SpO2 96 %, unknown if currently breastfeeding.   General: No apparent distress alert and oriented x3 mood and affect normal, dressed appropriately.  HEENT: Pupils equal, extraocular movements intact  Respiratory: Patient's speak in full sentences and  does not appear short of breath  Cardiovascular: No lower extremity edema, non tender, no erythema  Left foot exam shows the patient does have some rigid midfoot still noted.  Tender to palpation over the dorsal aspect of the midfoot.  Negative squeeze test noted.  Ambulating relatively well. Low back exam does have some loss of lordosis noted.  Patient does have tightness noted in the paraspinal musculature as well as parascapular.  Still seems to be more right greater than left.  Neck exam does have some limited sidebending bilaterally.  Limited muscular skeletal ultrasound was performed and interpreted by Hulan Saas, M  Limited ultrasound shows the patient does have hypoechoic changes noted.  This is over the midfoot.  Some increasing in neovascularization in Doppler flow but no true cortical irregularity more than patient's chronic arthritic changes. Impression: Midfoot arthritis  Osteopathic findings C2 flexed rotated and side bent right C4 flexed rotated and side bent left  C6 flexed rotated and side bent left T4 extended rotated and side bent right inhaled third rib L2 flexed rotated and side bent right Sacrum right on right     Impression and Recommendations:     The above documentation has been reviewed and is accurate and complete Lyndal Pulley, DO

## 2022-06-19 DIAGNOSIS — G4733 Obstructive sleep apnea (adult) (pediatric): Secondary | ICD-10-CM | POA: Diagnosis not present

## 2022-06-24 ENCOUNTER — Ambulatory Visit: Payer: BC Managed Care – PPO | Admitting: Family Medicine

## 2022-06-24 ENCOUNTER — Other Ambulatory Visit: Payer: Self-pay

## 2022-06-24 ENCOUNTER — Encounter: Payer: Self-pay | Admitting: Family Medicine

## 2022-06-24 VITALS — BP 130/84 | HR 100 | Ht 66.0 in | Wt 227.0 lb

## 2022-06-24 DIAGNOSIS — M545 Low back pain, unspecified: Secondary | ICD-10-CM | POA: Insufficient documentation

## 2022-06-24 DIAGNOSIS — M79672 Pain in left foot: Secondary | ICD-10-CM

## 2022-06-24 DIAGNOSIS — M9901 Segmental and somatic dysfunction of cervical region: Secondary | ICD-10-CM | POA: Diagnosis not present

## 2022-06-24 DIAGNOSIS — M9904 Segmental and somatic dysfunction of sacral region: Secondary | ICD-10-CM | POA: Diagnosis not present

## 2022-06-24 DIAGNOSIS — M9903 Segmental and somatic dysfunction of lumbar region: Secondary | ICD-10-CM

## 2022-06-24 DIAGNOSIS — G8929 Other chronic pain: Secondary | ICD-10-CM

## 2022-06-24 DIAGNOSIS — S93622A Sprain of tarsometatarsal ligament of left foot, initial encounter: Secondary | ICD-10-CM | POA: Diagnosis not present

## 2022-06-24 DIAGNOSIS — M9902 Segmental and somatic dysfunction of thoracic region: Secondary | ICD-10-CM

## 2022-06-24 DIAGNOSIS — M79671 Pain in right foot: Secondary | ICD-10-CM

## 2022-06-24 DIAGNOSIS — M9908 Segmental and somatic dysfunction of rib cage: Secondary | ICD-10-CM

## 2022-06-24 MED ORDER — MELOXICAM 15 MG PO TABS: 15.0000 mg | ORAL_TABLET | Freq: Every day | ORAL | 0 refills | Status: DC

## 2022-06-24 NOTE — Assessment & Plan Note (Signed)

## 2022-06-24 NOTE — Assessment & Plan Note (Signed)
Low back exam does have some loss of lordosis.  Some tenderness to palpation in the paraspinal musculature.  Patient will continue to work on core strength.  We discussed hip abductor strengthening exercises.  Responding well to osteopathic manipulation.  Follow-up again in 6 to 8 weeks

## 2022-06-24 NOTE — Patient Instructions (Addendum)
Meloxicam daily for 2 weeks then in 5 days burst Rigid sole shoes Tart cherry 2400mg  at night See you again in 6-8 weeks

## 2022-06-24 NOTE — Assessment & Plan Note (Signed)
Continue arthritic changes noted of the midfoot.  Discussed with patient to continue to wear proper shoes, rigid soled shoes.  Topical anti-inflammatories and icing regimen.  Oral anti-inflammatories given as well.  Encourage patient to continue to work on weight loss which she has done remarkably well over the years.  Patient will follow-up with me again in 6 to 8 weeks.

## 2022-07-02 DIAGNOSIS — F419 Anxiety disorder, unspecified: Secondary | ICD-10-CM | POA: Diagnosis not present

## 2022-07-02 DIAGNOSIS — M62838 Other muscle spasm: Secondary | ICD-10-CM | POA: Diagnosis not present

## 2022-07-02 DIAGNOSIS — F5081 Binge eating disorder: Secondary | ICD-10-CM | POA: Diagnosis not present

## 2022-07-02 DIAGNOSIS — F909 Attention-deficit hyperactivity disorder, unspecified type: Secondary | ICD-10-CM | POA: Diagnosis not present

## 2022-07-22 ENCOUNTER — Other Ambulatory Visit: Payer: Self-pay | Admitting: Family Medicine

## 2022-07-23 DIAGNOSIS — F4322 Adjustment disorder with anxiety: Secondary | ICD-10-CM | POA: Diagnosis not present

## 2022-07-30 DIAGNOSIS — G4733 Obstructive sleep apnea (adult) (pediatric): Secondary | ICD-10-CM | POA: Diagnosis not present

## 2022-07-30 DIAGNOSIS — F418 Other specified anxiety disorders: Secondary | ICD-10-CM | POA: Diagnosis not present

## 2022-07-30 DIAGNOSIS — F909 Attention-deficit hyperactivity disorder, unspecified type: Secondary | ICD-10-CM | POA: Diagnosis not present

## 2022-07-31 NOTE — Progress Notes (Deleted)
[name] [address] [place] [address] Phone: [phone] Subjective:    I'm seeing this patient by the request  of:  [name], MD  CC:   [id number]  Katie Glass is a 47 y.o. female coming in with complaint of L foot and lumbar spine pain. Patient states       Past Medical History:  Diagnosis Date   Arthritis    knees   Asthma    as a child   Back pain    Food allergy    Tannin/sulfate in Wine and ciders, rash on face and hands   Joint pain    Lower extremity edema    MMT (medial meniscus tear)    right   Sleep apnea    CPAP nightly   Umbilical hernia    Past Surgical History:  Procedure Laterality Date   BILATERAL SALPINGECTOMY Bilateral 04/15/2018   Procedure: BILATERAL SALPINGECTOMY;  Surgeon: Carrington Clamp, MD;  Location: [facility];  Service: Obstetrics;  Laterality: Bilateral;   CESAREAN SECTION  11/12/2010   Procedure: CESAREAN SECTION;  Surgeon: Almon Hercules;  Location: [facility];  Service: Gynecology;  Laterality: N/A;   CESAREAN SECTION N/A 04/15/2018   Procedure: CESAREAN SECTION;  Surgeon: Carrington Clamp, MD;  Location: [facility];  Service: Obstetrics;  Laterality: N/A;   KNEE ARTHROSCOPY Right 02/11/2017   Procedure: RIGHT KNEE ARTHROSCOPY WITH DEBRIDEMENT CHONDROMALACIA;  Surgeon: Gean Birchwood, MD;  Location: [facility];  Service: Orthopedics;  Laterality: Right;   Social History   Socioeconomic History   Marital status: Married    Spouse name: Rilyn Ostenson   Number of children: Not on file   Years of education: Not on file   Highest education level: Not on file  Occupational History   Occupation: Pastor  Tobacco Use   Smoking status: Never   Smokeless tobacco: Never  Vaping Use   Vaping Use: Never used  Substance and Sexual Activity   Alcohol use: Yes    Comment: social   Drug use: No   Sexual activity: Yes    Birth control/protection: None   Other Topics Concern   Not on file  Social History Narrative   Not on file   Social Determinants of Health   Financial Resource Strain: Low Risk  (04/06/2018)   Overall Financial Resource Strain (CARDIA)    Difficulty of Paying Living Expenses: Not hard at all  Food Insecurity: No Food Insecurity (04/06/2018)   Hunger Vital Sign    Worried About Running Out of Food in the Last Year: Never true    Ran Out of Food in the Last Year: Never true  Transportation Needs: Unknown (04/06/2018)   PRAPARE - Administrator, Civil Service (Medical): No    Lack of Transportation (Non-Medical): Not on file  Physical Activity: Not on file  Stress: Stress Concern Present (04/06/2018)   [employer] of Occupational Health - Occupational Stress Questionnaire    Feeling of Stress : Very much  Social Connections: Not on file   No Known Allergies Family History  Problem Relation Age of Onset   Hypertension Father    Hyperlipidemia Father    Heart disease Father    Cancer Maternal Grandfather    Heart disease Paternal Grandfather    Pulmonary embolism Mother    Alcohol abuse Mother     Current Outpatient Medications (Endocrine & Metabolic):    levonorgestrel (MIRENA) 20 MCG/24HR IUD, 1  each by Intrauterine route once.  Current Outpatient Medications (Cardiovascular):    nitroGLYCERIN (NITRO-DUR) 0.2 mg/hr patch, Apply 1/4 of a patch to skin once daily.   spironolactone (ALDACTONE) 50 MG tablet, Take 1 tablet (50 mg total) by mouth daily.  Current Outpatient Medications (Respiratory):    cetirizine (ZYRTEC) 10 MG tablet, Take 10 mg by mouth daily.  Current Outpatient Medications (Analgesics):    meloxicam (MOBIC) 15 MG tablet, TAKE 1 TABLET(15 MG) BY MOUTH DAILY   Current Outpatient Medications (Other):    Lisdexamfetamine Dimesylate (VYVANSE) 60 MG CHEW, Chew 60 mg by mouth daily.   Lisdexamfetamine Dimesylate (VYVANSE) 60 MG CHEW, Chew 60 mg by mouth every morning.    Lisdexamfetamine Dimesylate (VYVANSE) 60 MG CHEW, Chew 60 mg by mouth every morning.   Multiple Vitamins-Minerals (WOMENS MULTI VITAMIN & MINERAL PO), Take 1 tablet by mouth daily.   Semaglutide-Weight Management (WEGOVY) 2.4 MG/0.75ML SOAJ, Inject 2.4 mg into the skin once a week.   venlafaxine XR (EFFEXOR XR) 37.5 MG 24 hr capsule, Take 1 capsule (37.5 mg total) by mouth daily with breakfast.   Vitamin D, Ergocalciferol, (DRISDOL) 1.25 MG (50000 UNIT) CAPS capsule, Take 1 capsule (50,000 Units total) by mouth every 7 (seven) days.   Reviewed prior external information including notes and imaging from  primary care provider As well as notes that were available from care everywhere and other healthcare systems.  Past medical history, social, surgical and family history all reviewed in electronic medical record.  No pertanent information unless stated regarding to the chief complaint.   Review of Systems:  No headache, visual changes, nausea, vomiting, diarrhea, constipation, dizziness, abdominal pain, skin rash, fevers, chills, night sweats, weight loss, swollen lymph nodes, body aches, joint swelling, chest pain, shortness of breath, mood changes. POSITIVE muscle aches  Objective  unknown if currently breastfeeding.   General: No apparent distress alert and oriented x3 mood and affect normal, dressed appropriately.  HEENT: Pupils equal, extraocular movements intact  Respiratory: Patient's speak in full sentences and does not appear short of breath  Cardiovascular: No lower extremity edema, non tender, no erythema      Impression and Recommendations:

## 2022-08-05 ENCOUNTER — Ambulatory Visit: Payer: BC Managed Care – PPO | Admitting: Family Medicine

## 2022-08-11 ENCOUNTER — Ambulatory Visit: Payer: BC Managed Care – PPO | Admitting: Family Medicine

## 2022-08-11 ENCOUNTER — Ambulatory Visit (INDEPENDENT_AMBULATORY_CARE_PROVIDER_SITE_OTHER): Payer: BC Managed Care – PPO

## 2022-08-11 ENCOUNTER — Encounter: Payer: Self-pay | Admitting: Family Medicine

## 2022-08-11 ENCOUNTER — Other Ambulatory Visit: Payer: Self-pay

## 2022-08-11 VITALS — BP 116/84 | HR 86 | Ht 66.0 in | Wt 233.0 lb

## 2022-08-11 DIAGNOSIS — M5412 Radiculopathy, cervical region: Secondary | ICD-10-CM | POA: Diagnosis not present

## 2022-08-11 DIAGNOSIS — M542 Cervicalgia: Secondary | ICD-10-CM

## 2022-08-11 DIAGNOSIS — M9903 Segmental and somatic dysfunction of lumbar region: Secondary | ICD-10-CM

## 2022-08-11 DIAGNOSIS — M5010 Cervical disc disorder with radiculopathy, unspecified cervical region: Secondary | ICD-10-CM

## 2022-08-11 DIAGNOSIS — S93622A Sprain of tarsometatarsal ligament of left foot, initial encounter: Secondary | ICD-10-CM

## 2022-08-11 DIAGNOSIS — M9904 Segmental and somatic dysfunction of sacral region: Secondary | ICD-10-CM | POA: Diagnosis not present

## 2022-08-11 DIAGNOSIS — M9902 Segmental and somatic dysfunction of thoracic region: Secondary | ICD-10-CM | POA: Diagnosis not present

## 2022-08-11 DIAGNOSIS — M9908 Segmental and somatic dysfunction of rib cage: Secondary | ICD-10-CM

## 2022-08-11 DIAGNOSIS — M9901 Segmental and somatic dysfunction of cervical region: Secondary | ICD-10-CM

## 2022-08-11 MED ORDER — PREDNISONE 50 MG PO TABS: 50.0000 mg | ORAL_TABLET | Freq: Every day | ORAL | 0 refills | Status: AC

## 2022-08-11 MED ORDER — METHYLPREDNISOLONE ACETATE 80 MG/ML IJ SUSP
80.0000 mg | Freq: Once | INTRAMUSCULAR | Status: AC
Start: 2022-08-11 — End: 2022-08-11
  Administered 2022-08-11: 80 mg via INTRAMUSCULAR

## 2022-08-11 MED ORDER — KETOROLAC TROMETHAMINE 60 MG/2ML IM SOLN
60.0000 mg | Freq: Once | INTRAMUSCULAR | Status: AC
Start: 2022-08-11 — End: 2022-08-11
  Administered 2022-08-11: 60 mg via INTRAMUSCULAR

## 2022-08-11 NOTE — Assessment & Plan Note (Addendum)
Patient is having cervical radiculopathy.  Discussed which activities to do and which ones to avoid.  Increase activity slowly otherwise.  Discussed icing regimen and home exercises concerned with some radicular symptoms down the left arm.  Likely no significant weakness noted.  Discussed icing regimen and home exercises.  Discussed which activities to do and which ones to avoid.  Prednisone given.  Toradol and Depo-Medrol given.  Worsening symptoms will need to consider advanced imaging.

## 2022-08-11 NOTE — Progress Notes (Signed)
[name] [address] [place] [address] Phone: [phone] Subjective:    I'm seeing this patient by the request  of:  Katie Joe, MD , Low back pain CC: foot pain follow up   [id number]  Katie Glass is a 47 y.o. female coming in with complaint of foot pain as well as low back.  Was found to have a Lisfranc sprain.  Patient was to increase activity very slowly.  Patient states sx are improving.   Today pt notes pain in the left side of neck radiating into the shoulder. Radiating pain extends beyond the elbow into the forearm. Denies n/t into the hand. Sx worse in the mornings. Sx wax and wane. Denies weakness or decreased grip strength. Limited ROM looking up, starting to have pain with side-to-side. Denies HA related to neck pain. Has tried something to help with sleep prescribed by Dr. Earlene Plater, possibly Cyclobenzaprine.      Past Medical History:  Diagnosis Date   Arthritis    knees   Asthma    as a child   Back pain    Food allergy    Tannin/sulfate in Wine and ciders, rash on face and hands   Joint pain    Lower extremity edema    MMT (medial meniscus tear)    right   Sleep apnea    CPAP nightly   Umbilical hernia    Past Surgical History:  Procedure Laterality Date   BILATERAL SALPINGECTOMY Bilateral 04/15/2018   Procedure: BILATERAL SALPINGECTOMY;  Surgeon: Carrington Clamp, MD;  Location: [facility];  Service: Obstetrics;  Laterality: Bilateral;   CESAREAN SECTION  11/12/2010   Procedure: CESAREAN SECTION;  Surgeon: Almon Hercules;  Location: [facility];  Service: Gynecology;  Laterality: N/A;   CESAREAN SECTION N/A 04/15/2018   Procedure: CESAREAN SECTION;  Surgeon: Carrington Clamp, MD;  Location: [facility];  Service: Obstetrics;  Laterality: N/A;   KNEE ARTHROSCOPY Right 02/11/2017   Procedure: RIGHT KNEE ARTHROSCOPY WITH DEBRIDEMENT CHONDROMALACIA;  Surgeon: Gean Birchwood, MD;  Location: [facility];  Service: Orthopedics;  Laterality: Right;   Social History   Socioeconomic History   Marital status: Married    Spouse name: Katie Glass   Number of children: Not on file   Years of education: Not on file   Highest education level: Not on file  Occupational History   Occupation: Pastor  Tobacco Use   Smoking status: Never   Smokeless tobacco: Never  Vaping Use   Vaping Use: Never used  Substance and Sexual Activity   Alcohol use: Yes    Comment: social   Drug use: No   Sexual activity: Yes    Birth control/protection: None  Other Topics Concern   Not on file  Social History Narrative   Not on file   Social Determinants of Health   Financial Resource Strain: Low Risk  (04/06/2018)   Overall Financial Resource Strain (CARDIA)    Difficulty of Paying Living Expenses: Not hard at all  Food Insecurity: No Food Insecurity (04/06/2018)   Hunger Vital Sign    Worried About Running Out of Food in the Last Year: Never true    Ran Out of Food in the Last Year: Never true  Transportation Needs: Unknown (04/06/2018)   PRAPARE - Administrator, Civil Service (Medical): No    Lack of Transportation (Non-Medical): Not on file  Physical Activity: Not on file  Stress: Stress  Concern Present (04/06/2018)   [employer] of Occupational Health - Occupational Stress Questionnaire    Feeling of Stress : Very much  Social Connections: Not on file   No Known Allergies Family History  Problem Relation Age of Onset   Hypertension Father    Hyperlipidemia Father    Heart disease Father    Cancer Maternal Grandfather    Heart disease Paternal Grandfather    Pulmonary embolism Mother    Alcohol abuse Mother     Current Outpatient Medications (Endocrine & Metabolic):    levonorgestrel (MIRENA) 20 MCG/24HR IUD, 1 each by Intrauterine route once.   predniSONE (DELTASONE) 50 MG tablet, Take 1 tablet (50 mg total) by mouth daily with breakfast for 5  days.  Current Facility-Administered Medications (Endocrine & Metabolic):    methylPREDNISolone acetate (DEPO-MEDROL) injection 80 mg  Current Outpatient Medications (Cardiovascular):    nitroGLYCERIN (NITRO-DUR) 0.2 mg/hr patch, Apply 1/4 of a patch to skin once daily.   spironolactone (ALDACTONE) 50 MG tablet, Take 1 tablet (50 mg total) by mouth daily.   Current Outpatient Medications (Respiratory):    cetirizine (ZYRTEC) 10 MG tablet, Take 10 mg by mouth daily.   Current Outpatient Medications (Analgesics):    meloxicam (MOBIC) 15 MG tablet, TAKE 1 TABLET(15 MG) BY MOUTH DAILY  Current Facility-Administered Medications (Analgesics):    ketorolac (TORADOL) injection 60 mg    Current Outpatient Medications (Other):    cyclobenzaprine (FLEXERIL) 10 MG tablet, Take 1 tablet by mouth once daily at bedtime as needed. for 30 days   Lisdexamfetamine Dimesylate (VYVANSE) 60 MG CHEW, Chew 60 mg by mouth daily.   Lisdexamfetamine Dimesylate (VYVANSE) 60 MG CHEW, Chew 60 mg by mouth every morning.   Lisdexamfetamine Dimesylate (VYVANSE) 60 MG CHEW, Chew 60 mg by mouth every morning.   Multiple Vitamins-Minerals (WOMENS MULTI VITAMIN & MINERAL PO), Take 1 tablet by mouth daily.   Semaglutide-Weight Management (WEGOVY) 2.4 MG/0.75ML SOAJ, Inject 2.4 mg into the skin once a week.   venlafaxine XR (EFFEXOR XR) 37.5 MG 24 hr capsule, Take 1 capsule (37.5 mg total) by mouth daily with breakfast.   Vitamin D, Ergocalciferol, (DRISDOL) 1.25 MG (50000 UNIT) CAPS capsule, Take 1 capsule (50,000 Units total) by mouth every 7 (seven) days.    Reviewed prior external information including notes and imaging from  primary care provider As well as notes that were available from care everywhere and other healthcare systems.  Past medical history, social, surgical and family history all reviewed in electronic medical record.  No pertanent information unless stated regarding to the chief complaint.    Review of Systems:  No headache, visual changes, nausea, vomiting, diarrhea, constipation, dizziness, abdominal pain, skin rash, fevers, chills, night sweats, weight loss, swollen lymph nodes, body aches, joint swelling, chest pain, shortness of breath, mood changes. POSITIVE muscle aches  Objective  Blood pressure 116/84, pulse 86, height 5\' 6"  (1.676 m), weight 233 lb (105.7 kg), SpO2 98 %, unknown if currently breastfeeding.   General: No apparent distress alert and oriented x3 mood and affect normal, dressed appropriately.  HEENT: Pupils equal, extraocular movements intact  Respiratory: Patient's speak in full sentences and does not appear short of breath  Cardiovascular: No lower extremity edema, non tender, no erythema  Neck loss of lordosis positive radicular symptoms.  Negative weakness  Patient does have tightness that seems over in the parascapular area on the left side.   Osteopathic findings C2 flexed rotated and side bent right C5 flexed rotated  and side bent left C6 flexed rotated and side bent left T3 extended rotated and side bent left inhaled third rib T8 extended rotated and side bent left L2 flexed rotated and side bent right Sacrum right on right    Impression and Recommendations:     The above documentation has been reviewed and is accurate and complete Judi Saa, DO

## 2022-08-11 NOTE — Patient Instructions (Addendum)
Thank you for coming in today.  Please get an Xray today before you leave  Start Prednisone daily for 5 days

## 2022-08-12 DIAGNOSIS — F4322 Adjustment disorder with anxiety: Secondary | ICD-10-CM | POA: Diagnosis not present

## 2022-08-22 ENCOUNTER — Ambulatory Visit: Payer: BC Managed Care – PPO | Admitting: Family Medicine

## 2022-08-25 ENCOUNTER — Other Ambulatory Visit: Payer: Self-pay | Admitting: Family Medicine

## 2022-08-27 DIAGNOSIS — R632 Polyphagia: Secondary | ICD-10-CM | POA: Diagnosis not present

## 2022-08-27 DIAGNOSIS — M792 Neuralgia and neuritis, unspecified: Secondary | ICD-10-CM | POA: Diagnosis not present

## 2022-08-27 DIAGNOSIS — F909 Attention-deficit hyperactivity disorder, unspecified type: Secondary | ICD-10-CM | POA: Diagnosis not present

## 2022-08-27 DIAGNOSIS — F520 Hypoactive sexual desire disorder: Secondary | ICD-10-CM | POA: Diagnosis not present

## 2022-08-28 NOTE — Progress Notes (Unsigned)
[facility] [address] [place] [address] Phone: [phone] Subjective:   Katie Glass, am serving as a scribe for Dr. Antoine Primas.  I'm seeing this patient by the request  of:  [name], MD  CC: Back pain and foot and ankle pain  BJY:NWGNFAOZHY  Katie Glass is a 47 y.o. female coming in with complaint of back and neck pain. OMT 08/11/2022. Also f/u for L ankle pain. Patient states same per usual. No new concerns.  Medications patient has been prescribed: Meloxicam  Taking:         Reviewed prior external information including notes and imaging from previsou exam, outside providers and external EMR if available.   As well as notes that were available from care everywhere and other healthcare systems.  Past medical history, social, surgical and family history all reviewed in electronic medical record.  No pertanent information unless stated regarding to the chief complaint.   Past Medical History:  Diagnosis Date   Arthritis    knees   Asthma    as a child   Back pain    Food allergy    Tannin/sulfate in Wine and ciders, rash on face and hands   Joint pain    Lower extremity edema    MMT (medial meniscus tear)    right   Sleep apnea    CPAP nightly   Umbilical hernia     No Known Allergies   Review of Systems:  No headache, visual changes, nausea, vomiting, diarrhea, constipation, dizziness, abdominal pain, skin rash, fevers, chills, night sweats, weight loss, swollen lymph nodes, body aches, joint swelling, chest pain, shortness of breath, mood changes. POSITIVE muscle aches  Objective  Blood pressure 110/76, pulse 95, height 5\' 6"  (1.676 m), weight 231 lb (104.8 kg), SpO2 96 %, unknown if currently breastfeeding.   General: No apparent distress alert and oriented x3 mood and affect normal, dressed appropriately.  HEENT: Pupils equal, extraocular movements intact  Respiratory: Patient's speak in full  sentences and does not appear short of breath  Cardiovascular: No lower extremity edema, non tender, no erythema  Gait MSK:  Neck exam does have significant tightness noted on the left side.  More still mild positive Spurling's noted. Osteopathic findings  C2 flexed rotated and side bent left C6 flexed rotated and side bent left T3 extended rotated and side bent right inhaled rib T9 extended rotated and side bent left L2 flexed rotated and side bent right Sacrum right on right   After consent patient was prepped with alcohol swabs and with a 25-gauge half inch needle injected into 3 distinct trigger points in the left shoulder area including the rhomboid, trapezius, and levator scapula muscle.  A total of 3 cc of 0.5% Marcaine and 1 cc of Kenalog 40 mg/mL.  No blood loss.  Band-Aids placed.  Postinjection instructions given     Assessment and Plan:  Cervical disc disorder with radiculopathy of cervical region Continue radiculopathy.  Patient seen formal physical therapy as well as acupuncture and dry needling.  Discussed with patient about icing regimen and home exercises.  Responding relatively well to home exercises and taking trigger point injections.  Hopeful that this will make significant improvement as well.  Follow-up again in 6 to 8 weeks.    Nonallopathic problems  Decision today to treat with OMT was based on Physical Exam  After verbal consent patient was treated with HVLA, ME, FPR techniques in cervical, rib,  thoracic, lumbar, and sacral  areas  Patient tolerated the procedure well with improvement in symptoms  Patient given exercises, stretches and lifestyle modifications  See medications in patient instructions if given  Patient will follow up in 4-8 weeks     The above documentation has been reviewed and is accurate and complete Judi Saa, DO         Note: This dictation was prepared with Dragon dictation along with smaller phrase technology. Any  transcriptional errors that result from this process are unintentional.

## 2022-09-02 ENCOUNTER — Encounter: Payer: Self-pay | Admitting: Family Medicine

## 2022-09-02 ENCOUNTER — Ambulatory Visit: Payer: BC Managed Care – PPO | Admitting: Family Medicine

## 2022-09-02 VITALS — BP 110/76 | HR 95 | Ht 66.0 in | Wt 231.0 lb

## 2022-09-02 DIAGNOSIS — M25512 Pain in left shoulder: Secondary | ICD-10-CM

## 2022-09-02 DIAGNOSIS — M9904 Segmental and somatic dysfunction of sacral region: Secondary | ICD-10-CM | POA: Diagnosis not present

## 2022-09-02 DIAGNOSIS — M5010 Cervical disc disorder with radiculopathy, unspecified cervical region: Secondary | ICD-10-CM | POA: Diagnosis not present

## 2022-09-02 DIAGNOSIS — M9902 Segmental and somatic dysfunction of thoracic region: Secondary | ICD-10-CM

## 2022-09-02 DIAGNOSIS — M9908 Segmental and somatic dysfunction of rib cage: Secondary | ICD-10-CM

## 2022-09-02 DIAGNOSIS — M9901 Segmental and somatic dysfunction of cervical region: Secondary | ICD-10-CM

## 2022-09-02 DIAGNOSIS — M9903 Segmental and somatic dysfunction of lumbar region: Secondary | ICD-10-CM | POA: Diagnosis not present

## 2022-09-02 NOTE — Assessment & Plan Note (Signed)
Continue radiculopathy.  Patient seen formal physical therapy as well as acupuncture and dry needling.  Discussed with patient about icing regimen and home exercises.  Responding relatively well to home exercises and taking trigger point injections.  Hopeful that this will make significant improvement as well.  Follow-up again in 6 to 8 weeks.

## 2022-09-02 NOTE — Patient Instructions (Signed)
Trigger point injections today See you again in 6 weeks

## 2022-09-02 NOTE — Assessment & Plan Note (Signed)
Patient given injections and tolerated the procedure well, discussed icing regimen and home exercises, discussed which activities to do and which ones to avoid.  Follow-up again in 6 to 8 weeks continue the other modalities including the dry needling.

## 2022-09-08 ENCOUNTER — Ambulatory Visit: Payer: BC Managed Care – PPO | Admitting: Family Medicine

## 2022-09-19 DIAGNOSIS — Z6837 Body mass index (BMI) 37.0-37.9, adult: Secondary | ICD-10-CM | POA: Diagnosis not present

## 2022-09-19 DIAGNOSIS — Z1231 Encounter for screening mammogram for malignant neoplasm of breast: Secondary | ICD-10-CM | POA: Diagnosis not present

## 2022-09-19 DIAGNOSIS — Z01419 Encounter for gynecological examination (general) (routine) without abnormal findings: Secondary | ICD-10-CM | POA: Diagnosis not present

## 2022-09-21 ENCOUNTER — Other Ambulatory Visit: Payer: Self-pay | Admitting: Family Medicine

## 2022-10-06 DIAGNOSIS — F4322 Adjustment disorder with anxiety: Secondary | ICD-10-CM | POA: Diagnosis not present

## 2022-10-07 DIAGNOSIS — F909 Attention-deficit hyperactivity disorder, unspecified type: Secondary | ICD-10-CM | POA: Diagnosis not present

## 2022-10-07 DIAGNOSIS — F5081 Binge eating disorder: Secondary | ICD-10-CM | POA: Diagnosis not present

## 2022-10-07 DIAGNOSIS — F439 Reaction to severe stress, unspecified: Secondary | ICD-10-CM | POA: Diagnosis not present

## 2022-10-10 DIAGNOSIS — G4733 Obstructive sleep apnea (adult) (pediatric): Secondary | ICD-10-CM | POA: Diagnosis not present

## 2022-10-17 NOTE — Progress Notes (Unsigned)
[name] [address] [place] [address] Phone: [phone] Subjective:    I'm seeing this patient by the request  of:  [name], MD  CC: back and neck pain follow up   BJY:NWGNFAOZHY  Katie Glass is a 47 y.o. female coming in with complaint of back and neck pain. OMT 09/02/2022.  At last exam patient also had trigger point injections.  Patient states that she is good. Her problem now is that she get a jolt of pain in her neck when she hits a pot hole on [employer]. That jolt will cause lasting pain when she does other activities like jogging   Medications patient has been prescribed: Meloxicam  Taking:         Reviewed prior external information including notes and imaging from previsou exam, outside providers and external EMR if available.   As well as notes that were available from care everywhere and other healthcare systems.  Past medical history, social, surgical and family history all reviewed in electronic medical record.  No pertanent information unless stated regarding to the chief complaint.   Past Medical History:  Diagnosis Date   Arthritis    knees   Asthma    as a child   Back pain    Food allergy    Tannin/sulfate in Wine and ciders, rash on face and hands   Joint pain    Lower extremity edema    MMT (medial meniscus tear)    right   Sleep apnea    CPAP nightly   Umbilical hernia     No Known Allergies   Review of Systems:  No headache, visual changes, nausea, vomiting, diarrhea, constipation, dizziness, abdominal pain, skin rash, fevers, chills, night sweats, weight loss, swollen lymph nodes, body aches, joint swelling, chest pain, shortness of breath, mood changes. POSITIVE muscle aches  Objective  Blood pressure 120/82, pulse 93, height 5\' 6"  (1.676 m), weight 234 lb (106.1 kg), SpO2 98%, unknown if currently breastfeeding.   General: No apparent distress alert and oriented x3 mood and  affect normal, dressed appropriately.  HEENT: Pupils equal, extraocular movements intact  Respiratory: Patient's speak in full sentences and does not appear short of breath  Cardiovascular: No lower extremity edema, non tender, no erythema    Osteopathic findings  C2 flexed rotated and side bent right C6 flexed rotated and side bent left T3 extended rotated and side bent left inhaled rib T7 extended rotated and side bent left inhaled rib L2 flexed rotated and side bent right Sacrum right on right     Assessment and Plan:  Cervical disc disorder with radiculopathy of cervical region Continues to have some mild intermittent radicular symptoms.  Seems to go mostly to the left shoulder region.  Discussed with patient about posture and ergonomics.  Discussed potentially with patient having exacerbations when driving to monitor for 15.  Follow-up again in 6 to 8 weeks    Nonallopathic problems  Decision today to treat with OMT was based on Physical Exam  After verbal consent patient was treated with HVLA, ME, FPR techniques in cervical, rib, thoracic, lumbar, and sacral  areas  Patient tolerated the procedure well with improvement in symptoms  Patient given exercises, stretches and lifestyle modifications  See medications in patient instructions if given  Patient will follow up in 4-8 weeks     The above documentation has been reviewed and is accurate and complete Judi Saa, DO  Note: This dictation was prepared with Dragon dictation along with smaller phrase technology. Any transcriptional errors that result from this process are unintentional.

## 2022-10-20 ENCOUNTER — Other Ambulatory Visit: Payer: Self-pay | Admitting: Family Medicine

## 2022-10-21 ENCOUNTER — Encounter: Payer: Self-pay | Admitting: Family Medicine

## 2022-10-21 ENCOUNTER — Ambulatory Visit (INDEPENDENT_AMBULATORY_CARE_PROVIDER_SITE_OTHER): Payer: BC Managed Care – PPO | Admitting: Family Medicine

## 2022-10-21 VITALS — BP 120/82 | HR 93 | Ht 66.0 in | Wt 234.0 lb

## 2022-10-21 DIAGNOSIS — M9903 Segmental and somatic dysfunction of lumbar region: Secondary | ICD-10-CM

## 2022-10-21 DIAGNOSIS — M9904 Segmental and somatic dysfunction of sacral region: Secondary | ICD-10-CM | POA: Diagnosis not present

## 2022-10-21 DIAGNOSIS — M9902 Segmental and somatic dysfunction of thoracic region: Secondary | ICD-10-CM

## 2022-10-21 DIAGNOSIS — M5010 Cervical disc disorder with radiculopathy, unspecified cervical region: Secondary | ICD-10-CM | POA: Diagnosis not present

## 2022-10-21 DIAGNOSIS — M9901 Segmental and somatic dysfunction of cervical region: Secondary | ICD-10-CM

## 2022-10-21 DIAGNOSIS — F4322 Adjustment disorder with anxiety: Secondary | ICD-10-CM | POA: Diagnosis not present

## 2022-10-21 NOTE — Assessment & Plan Note (Signed)
Continues to have some mild intermittent radicular symptoms.  Seems to go mostly to the left shoulder region.  Discussed with patient about posture and ergonomics.  Discussed potentially with patient having exacerbations when driving to monitor for 15.  Follow-up again in 6 to 8 weeks

## 2022-11-11 DIAGNOSIS — D3132 Benign neoplasm of left choroid: Secondary | ICD-10-CM | POA: Diagnosis not present

## 2022-11-27 DIAGNOSIS — R632 Polyphagia: Secondary | ICD-10-CM | POA: Diagnosis not present

## 2022-11-27 DIAGNOSIS — F411 Generalized anxiety disorder: Secondary | ICD-10-CM | POA: Diagnosis not present

## 2022-11-27 DIAGNOSIS — F909 Attention-deficit hyperactivity disorder, unspecified type: Secondary | ICD-10-CM | POA: Diagnosis not present

## 2022-12-02 NOTE — Progress Notes (Unsigned)
[name] [address] [place] [address] Phone: [phone] Subjective:    I'm seeing this patient by the request  of:  [name], MD  CC: Neck and back pain follow-up  BJY:NWGNFAOZHY  Katie Glass is a 47 y.o. female coming in with complaint of back and neck pain. OMT 10/21/2022. Patient states continues to have some discomfort and pain.  States it is more in the left side of the neck.  Describes it as a dull, throbbing aching pain.  Nothing that stopping her from activity.  States that it seems to be at the top of the scapula and then moves into the neck.  Hard to tell which way sometimes it seems to aggravate.  Sometimes with some forearm discomfort.  Sometimes with some wrist discomfort as well.  Medications patient has been prescribed: Meloxicam  Taking:         Reviewed prior external information including notes and imaging from previsou exam, outside providers and external EMR if available.   As well as notes that were available from care everywhere and other healthcare systems.  Past medical history, social, surgical and family history all reviewed in electronic medical record.  No pertanent information unless stated regarding to the chief complaint.   Past Medical History:  Diagnosis Date   Arthritis    knees   Asthma    as a child   Back pain    Food allergy    Tannin/sulfate in Wine and ciders, rash on face and hands   Joint pain    Lower extremity edema    MMT (medial meniscus tear)    right   Sleep apnea    CPAP nightly   Umbilical hernia     No Known Allergies   Review of Systems:  No headache, visual changes, nausea, vomiting, diarrhea, constipation, dizziness, abdominal pain, skin rash, fevers, chills, night sweats, weight loss, swollen lymph nodes, body aches, joint swelling, chest pain, shortness of breath, mood changes. POSITIVE muscle aches  Objective  Height 5\' 6"  (1.676 m), unknown if  currently breastfeeding.   General: No apparent distress alert and oriented x3 mood and affect normal, dressed appropriately.  HEENT: Pupils equal, extraocular movements intact  Respiratory: Patient's speak in full sentences and does not appear short of breath  Cardiovascular: No lower extremity edema, non tender, no erythema  Low back exam does have some loss of lordosis noted.  Some tenderness to palpation noted.  Tightness with Pearlean Brownie right greater than left.  Osteopathic findings  C3 flexed rotated and side bent right C7 flexed rotated and side bent left T3 extended rotated and side bent right inhaled rib T9 extended rotated and side bent left L2 flexed rotated and side bent right Sacrum right on right       Assessment and Plan:  Left carpal tunnel syndrome We will monitor but I do think there is a possibility of some of the coming from the cervical area.  Patient continues to see a chiropractor as well.  We discussed that we could do some medications but I believe the patient is still doing all right at the moment.  Discussed icing regimen and home exercises.    Nonallopathic problems  Decision today to treat with OMT was based on Physical Exam  After verbal consent patient was treated with HVLA, ME, FPR techniques in cervical, rib, thoracic, lumbar, and sacral  areas  Patient tolerated the procedure well with improvement in symptoms  Patient  given exercises, stretches and lifestyle modifications  See medications in patient instructions if given  Patient will follow up in 4-8 weeks     The above documentation has been reviewed and is accurate and complete Katie Saa, DO         Note: This dictation was prepared with Dragon dictation along with smaller phrase technology. Any transcriptional errors that result from this process are unintentional.

## 2022-12-03 ENCOUNTER — Encounter: Payer: Self-pay | Admitting: Family Medicine

## 2022-12-03 ENCOUNTER — Ambulatory Visit: Payer: BC Managed Care – PPO | Admitting: Family Medicine

## 2022-12-03 VITALS — Ht 66.0 in

## 2022-12-03 DIAGNOSIS — S93622A Sprain of tarsometatarsal ligament of left foot, initial encounter: Secondary | ICD-10-CM

## 2022-12-03 DIAGNOSIS — M9903 Segmental and somatic dysfunction of lumbar region: Secondary | ICD-10-CM

## 2022-12-03 DIAGNOSIS — M9901 Segmental and somatic dysfunction of cervical region: Secondary | ICD-10-CM | POA: Diagnosis not present

## 2022-12-03 DIAGNOSIS — M9904 Segmental and somatic dysfunction of sacral region: Secondary | ICD-10-CM | POA: Diagnosis not present

## 2022-12-03 DIAGNOSIS — M9902 Segmental and somatic dysfunction of thoracic region: Secondary | ICD-10-CM

## 2022-12-03 DIAGNOSIS — G5602 Carpal tunnel syndrome, left upper limb: Secondary | ICD-10-CM | POA: Diagnosis not present

## 2022-12-03 DIAGNOSIS — G8929 Other chronic pain: Secondary | ICD-10-CM

## 2022-12-03 DIAGNOSIS — M9908 Segmental and somatic dysfunction of rib cage: Secondary | ICD-10-CM

## 2022-12-03 DIAGNOSIS — M545 Low back pain, unspecified: Secondary | ICD-10-CM

## 2022-12-03 NOTE — Assessment & Plan Note (Signed)
We will monitor but I do think there is a possibility of some of the coming from the cervical area.  Patient continues to see a chiropractor as well.  We discussed that we could do some medications but I believe the patient is still doing all right at the moment.  Discussed icing regimen and home exercises.

## 2022-12-03 NOTE — Assessment & Plan Note (Signed)
Patient does have midfoot still discomfort that is intermittent.  Discussed icing regimen and home exercises, which activities to do and which ones to avoid.  Follow-up again in 6 to 8 weeks

## 2022-12-03 NOTE — Assessment & Plan Note (Signed)
Chronic trouble, discussed icing regimen of home exercises.  Which activities to do and which ones to avoid.  Increase activity slowly.

## 2022-12-09 DIAGNOSIS — F4322 Adjustment disorder with anxiety: Secondary | ICD-10-CM | POA: Diagnosis not present

## 2022-12-25 DIAGNOSIS — F909 Attention-deficit hyperactivity disorder, unspecified type: Secondary | ICD-10-CM | POA: Diagnosis not present

## 2022-12-25 DIAGNOSIS — M2550 Pain in unspecified joint: Secondary | ICD-10-CM | POA: Diagnosis not present

## 2022-12-25 DIAGNOSIS — R632 Polyphagia: Secondary | ICD-10-CM | POA: Diagnosis not present

## 2023-01-05 DIAGNOSIS — F4322 Adjustment disorder with anxiety: Secondary | ICD-10-CM | POA: Diagnosis not present

## 2023-01-08 DIAGNOSIS — G4733 Obstructive sleep apnea (adult) (pediatric): Secondary | ICD-10-CM | POA: Diagnosis not present

## 2023-01-15 NOTE — Progress Notes (Signed)
[name] [address] [place] [address] Phone: [phone] Subjective:   [name], am serving as a scribe for Dr. Antoine Primas.  I'm seeing this patient by the request  of:  [name], MD  CC: Knee pain, back and neck pain  Katie Glass  MCKENLEE KLASS is a 47 y.o. female coming in with complaint of back and neck pain. OMT 12/03/2022. Also L carpal tunnel. Wrist is improving. Notices more pain when she is tight.   Patient states that dog ran into her L knee but she also feels like dog injured R knee.  Using brace. Also c/o R knee pain.   L thoracic spine spasm.   Medications patient has been prescribed: Meloxicam  Taking:         Reviewed prior external information including notes and imaging from previsou exam, outside providers and external EMR if available.   As well as notes that were available from care everywhere and other healthcare systems.  Past medical history, social, surgical and family history all reviewed in electronic medical record.  No pertanent information unless stated regarding to the chief complaint.   Past Medical History:  Diagnosis Date   Arthritis    knees   Asthma    as a child   Back pain    Food allergy    Tannin/sulfate in Wine and ciders, rash on face and hands   Joint pain    Lower extremity edema    MMT (medial meniscus tear)    right   Sleep apnea    CPAP nightly   Umbilical hernia     No Known Allergies   Review of Systems:  No headache, visual changes, nausea, vomiting, diarrhea, constipation, dizziness, abdominal pain, skin rash, fevers, chills, night sweats, weight loss, swollen lymph nodes, body aches, joint swelling, chest pain, shortness of breath, mood changes. POSITIVE muscle aches  Objective  Blood pressure 110/82, pulse 94, height 5\' 6"  (1.676 m), weight 231 lb (104.8 kg), SpO2 99%, unknown if currently breastfeeding.   General: No apparent distress alert  and oriented x3 mood and affect normal, dressed appropriately.  HEENT: Pupils equal, extraocular movements intact  Respiratory: Patient's speak in full sentences and does not appear short of breath  Cardiovascular: No lower extremity edema, non tender, no erythema  Low back does have some significant tightness noted.  This seems to be worsening thoracolumbar juncture, seems to be right greater than left.  Some tightness with FABER test.  Left knee does have good stability noted with valgus and varus force.  No significant findings that is consistent with ensuring no laxity.  Osteopathic findings  C2 flexed rotated and side bent right C6 flexed rotated and side bent left T3 extended rotated and side bent right inhaled rib T11 extended rotated and side bent left L2 flexed rotated and side bent right Sacrum right on right  Limited muscular skeletal ultrasound was performed and interpreted by Antoine Primas, M  Limited ultrasound of patient's left knee shows that the MCL is intact but has some very mild hypoechoic changes on the tibial component.  No dynamic gapping but does have a calcific change noted that seems to be more of a loose body just deep to the meniscus.     Assessment and Plan:  MCL sprain of left knee Sprain of the left MCL noted.  Discussed icing regimen and home exercises, discussed which activities to do and which ones to avoid.  Increase  activity slowly otherwise.  Discussed icing regimen and home exercises.  Increase activity slowly.  Patient seems to be doing all right but there is a potential loose body in the knee.  Limited time if worsening will need to consider advanced imaging.  Follow-up again in 6 to 8 weeks otherwise.  Acute medial meniscus tear, left, initial encounter Has had tearing of visible previously and we will need to monitor.  Do not see anything significant at this time other than the MCL sprain.  Follow-up again in 6 to 8 weeks  Low back pain Does  respond well to osteopathic manipulation.  Continue to work on core strength.  Patient is doing relatively well with her right hip and continuing this would be the most beneficial.  Follow-up again in 6 to 8 weeks    Nonallopathic problems  Decision today to treat with OMT was based on Physical Exam  After verbal consent patient was treated with HVLA, ME, FPR techniques in cervical, rib, thoracic, lumbar, and sacral  areas  Patient tolerated the procedure well with improvement in symptoms  Patient given exercises, stretches and lifestyle modifications  See medications in patient instructions if given  Patient will follow up in 4-8 weeks      The above documentation has been reviewed and is accurate and complete Judi Saa, DO        Note: This dictation was prepared with Dragon dictation along with smaller phrase technology. Any transcriptional errors that result from this process are unintentional.

## 2023-01-20 DIAGNOSIS — F909 Attention-deficit hyperactivity disorder, unspecified type: Secondary | ICD-10-CM | POA: Diagnosis not present

## 2023-01-20 DIAGNOSIS — R632 Polyphagia: Secondary | ICD-10-CM | POA: Diagnosis not present

## 2023-01-20 DIAGNOSIS — E559 Vitamin D deficiency, unspecified: Secondary | ICD-10-CM | POA: Diagnosis not present

## 2023-01-21 ENCOUNTER — Ambulatory Visit: Payer: BC Managed Care – PPO | Admitting: Family Medicine

## 2023-01-21 ENCOUNTER — Other Ambulatory Visit: Payer: Self-pay

## 2023-01-21 ENCOUNTER — Encounter: Payer: Self-pay | Admitting: Family Medicine

## 2023-01-21 VITALS — BP 110/82 | HR 94 | Ht 66.0 in | Wt 231.0 lb

## 2023-01-21 DIAGNOSIS — S83242A Other tear of medial meniscus, current injury, left knee, initial encounter: Secondary | ICD-10-CM | POA: Diagnosis not present

## 2023-01-21 DIAGNOSIS — M9908 Segmental and somatic dysfunction of rib cage: Secondary | ICD-10-CM

## 2023-01-21 DIAGNOSIS — R200 Anesthesia of skin: Secondary | ICD-10-CM

## 2023-01-21 DIAGNOSIS — M9902 Segmental and somatic dysfunction of thoracic region: Secondary | ICD-10-CM

## 2023-01-21 DIAGNOSIS — M9904 Segmental and somatic dysfunction of sacral region: Secondary | ICD-10-CM

## 2023-01-21 DIAGNOSIS — G5602 Carpal tunnel syndrome, left upper limb: Secondary | ICD-10-CM

## 2023-01-21 DIAGNOSIS — G8929 Other chronic pain: Secondary | ICD-10-CM

## 2023-01-21 DIAGNOSIS — M9903 Segmental and somatic dysfunction of lumbar region: Secondary | ICD-10-CM | POA: Diagnosis not present

## 2023-01-21 DIAGNOSIS — M1711 Unilateral primary osteoarthritis, right knee: Secondary | ICD-10-CM

## 2023-01-21 DIAGNOSIS — S83412A Sprain of medial collateral ligament of left knee, initial encounter: Secondary | ICD-10-CM | POA: Diagnosis not present

## 2023-01-21 DIAGNOSIS — M9901 Segmental and somatic dysfunction of cervical region: Secondary | ICD-10-CM | POA: Diagnosis not present

## 2023-01-21 DIAGNOSIS — M545 Low back pain, unspecified: Secondary | ICD-10-CM

## 2023-01-21 DIAGNOSIS — G5601 Carpal tunnel syndrome, right upper limb: Secondary | ICD-10-CM

## 2023-01-21 NOTE — Assessment & Plan Note (Signed)
Has had difficulty with this intermittently.  Will get nerve conduction test to see the severity and discuss further treatment options.

## 2023-01-21 NOTE — Patient Instructions (Signed)
Good to see you Stay active Will get gel approved for R knee B EMG- [facility] will call you See me in 7-8 weeks

## 2023-01-21 NOTE — Assessment & Plan Note (Signed)
Does respond well to osteopathic manipulation.  Continue to work on core strength.  Patient is doing relatively well with her right hip and continuing this would be the most beneficial.  Follow-up again in 6 to 8 weeks

## 2023-01-21 NOTE — Assessment & Plan Note (Signed)
Has had tearing of visible previously and we will need to monitor.  Do not see anything significant at this time other than the MCL sprain.  Follow-up again in 6 to 8 weeks

## 2023-01-21 NOTE — Assessment & Plan Note (Signed)
Sprain of the left MCL noted.  Discussed icing regimen and home exercises, discussed which activities to do and which ones to avoid.  Increase activity slowly otherwise.  Discussed icing regimen and home exercises.  Increase activity slowly.  Patient seems to be doing all right but there is a potential loose body in the knee.  Limited time if worsening will need to consider advanced imaging.  Follow-up again in 6 to 8 weeks otherwise.

## 2023-01-22 ENCOUNTER — Telehealth: Payer: Self-pay

## 2023-01-22 DIAGNOSIS — M1711 Unilateral primary osteoarthritis, right knee: Secondary | ICD-10-CM | POA: Insufficient documentation

## 2023-01-22 NOTE — Assessment & Plan Note (Signed)
Mild arthritic changes noted.  Discussed icing regimen and home exercises.  Discussed which activities to do and which ones to avoid.  I do think she would be a candidate for viscosupplementation.  Will see if we can get approval.

## 2023-01-22 NOTE — Telephone Encounter (Signed)
Redid notes

## 2023-01-22 NOTE — Telephone Encounter (Signed)
I am trying to pre cert patient for Monovisc but I was asked to run right knee and the only information about the right is is patient thinks her dog hurt her right knee. All the other notes talk about left knee specifically. I need more information on the right knee to be able to run patient. Why we want to run for the right knee, what's wrong with the right knee, what has been tried for the right knee.

## 2023-01-23 NOTE — Telephone Encounter (Signed)
Finished pre cert for monovisc. Waiting insurance approval

## 2023-02-02 ENCOUNTER — Encounter: Payer: Self-pay | Admitting: Neurology

## 2023-02-02 ENCOUNTER — Other Ambulatory Visit: Payer: Self-pay

## 2023-02-02 DIAGNOSIS — R202 Paresthesia of skin: Secondary | ICD-10-CM

## 2023-02-12 DIAGNOSIS — F4322 Adjustment disorder with anxiety: Secondary | ICD-10-CM | POA: Diagnosis not present

## 2023-02-16 DIAGNOSIS — E559 Vitamin D deficiency, unspecified: Secondary | ICD-10-CM | POA: Diagnosis not present

## 2023-02-16 DIAGNOSIS — F909 Attention-deficit hyperactivity disorder, unspecified type: Secondary | ICD-10-CM | POA: Diagnosis not present

## 2023-02-16 DIAGNOSIS — I10 Essential (primary) hypertension: Secondary | ICD-10-CM | POA: Diagnosis not present

## 2023-02-16 DIAGNOSIS — R7301 Impaired fasting glucose: Secondary | ICD-10-CM | POA: Diagnosis not present

## 2023-02-16 DIAGNOSIS — R632 Polyphagia: Secondary | ICD-10-CM | POA: Diagnosis not present

## 2023-02-16 DIAGNOSIS — Z1331 Encounter for screening for depression: Secondary | ICD-10-CM | POA: Diagnosis not present

## 2023-03-02 ENCOUNTER — Ambulatory Visit: Payer: BC Managed Care – PPO | Admitting: Neurology

## 2023-03-02 DIAGNOSIS — M5412 Radiculopathy, cervical region: Secondary | ICD-10-CM | POA: Diagnosis not present

## 2023-03-02 DIAGNOSIS — G5603 Carpal tunnel syndrome, bilateral upper limbs: Secondary | ICD-10-CM

## 2023-03-02 DIAGNOSIS — R202 Paresthesia of skin: Secondary | ICD-10-CM | POA: Diagnosis not present

## 2023-03-02 NOTE — Procedures (Signed)
[facility]  [address] [place], [address]  [place], [place] [address] Tel: [phone] Fax: [phone] Test Date:  03/02/2023  Patient: Katie Glass DOB: Apr 21, 1975 Physician: Jacquelyne Balint, MD  Sex: Female Height: 5\' 6"  Ref Phys: Antoine Primas, DO  ID#: [id number]   Technician:    History: This is a 47 year old female with neck and hand pain.  NCV & EMG Findings: Extensive electrodiagnostic evaluation of bilateral upper limbs shows: Left median sensory response is absent. Right median sensory response shows prolonged distal peak latency (4.2 ms). Bilateral ulnar and radial sensory responses are within normal limits. Bilateral median (APB) motor responses show prolonged distal onset latency (L6.9, R4.6 ms) and reduced amplitude (L4.5, R5.4 mV). Bilateral ulnar (ADM) motor responses are within normal limits. Chronic motor axon loss changes without accompanying active denervation changes are seen in right first dorsal interosseous, right extensor indicis proprius, right flexor pollicis longus, and right cervical paraspinal (C7 level) muscles.  Impression: This is an abnormal study. The findings are most consistent with the following: Evidence of bilateral median mononeuropathy at or distal to the wrist, consistent with carpal tunnel syndrome. The findings are severe in degree electrically on the left and mild in degree electrically on the right. The residuals of an old intraspinal canal lesion (ie: motor radiculopathy) at the right C8 root or segment, mild in degree electrically. No electrodiagnostic evidence of a left cervical (C5-C8) motor radiculopathy. Screening studies for right or left ulnar or radial neuropathies are normal.    ___________________________ Jacquelyne Balint, MD    Nerve Conduction Studies Motor Nerve Results    Latency Amplitude F-Lat Segment Distance CV Comment  Site (ms) Norm (mV) Norm (ms)  (cm) (m/s) Norm   Left Median (APB) Motor  Wrist *6.9   < 3.9 *4.5  > 6.0        Elbow 11.9 - 4.0 -  Elbow-Wrist 27 54  > 50   Right Median (APB) Motor  Wrist *4.6  < 3.9 *5.4  > 6.0        Elbow 9.9 - 5.3 -  Elbow-Wrist 27 51  > 50   Left Ulnar (ADM) Motor  Wrist 1.83  < 3.1 9.2  > 7.0        Bel elbow 4.9 - 8.3 -  Bel elbow-Wrist 19.5 63  > 50   Ab elbow 6.5 - 8.3 -  Ab elbow-Bel elbow 10 63 -   Right Ulnar (ADM) Motor  Wrist 1.68  < 3.1 11.0  > 7.0        Bel elbow 5.2 - 10.6 -  Bel elbow-Wrist 21 60  > 50   Ab elbow 6.9 - 10.3 -  Ab elbow-Bel elbow 10 59 -    Sensory Sites    Neg Peak Lat Amplitude (O-P) Segment Distance Velocity Comment  Site (ms) Norm (V) Norm  (cm) (ms)   Left Median Sensory  Wrist-Dig II *NR  < 3.4 *NR  > 20 Wrist-Dig II 13    Right Median Sensory  Wrist-Dig II *4.2  < 3.4 21  > 20 Wrist-Dig II 13    Left Radial Sensory  Forearm-Wrist 1.90  < 2.7 32  > 18 Forearm-Wrist 10    Right Radial Sensory  Forearm-Wrist 2.0  < 2.7 34  > 18 Forearm-Wrist 10    Left Ulnar Sensory  Wrist-Dig V 2.7  < 3.1 18  > 12 Wrist-Dig V 11    Right Ulnar Sensory  Wrist-Dig  V 2.6  < 3.1 23  > 12 Wrist-Dig V 11     Electromyography   Side Muscle Ins.Act Fibs Fasc Recrt Amp Dur Poly Activation Comment  Left FDI Nml Nml Nml Nml Nml Nml Nml Nml N/A  Left EIP Nml Nml Nml Nml Nml Nml Nml Nml N/A  Left Pronator teres Nml Nml Nml Nml Nml Nml Nml Nml N/A  Left Biceps Nml Nml Nml Nml Nml Nml Nml Nml N/A  Left Triceps Nml Nml Nml Nml Nml Nml Nml Nml N/A  Left Deltoid Nml Nml Nml Nml Nml Nml Nml Nml N/A  Right FDI Nml Nml Nml *1- *1+ *1+ Nml Nml N/A  Right EIP Nml Nml Nml *1- *1+ *1+ Nml Nml N/A  Right FPL Nml Nml Nml *1- *1+ *1+ Nml Nml N/A  Right Pronator teres Nml Nml Nml Nml Nml Nml Nml Nml N/A  Right Biceps Nml Nml Nml Nml Nml Nml Nml Nml N/A  Right Triceps Nml Nml Nml Nml Nml Nml Nml Nml N/A  Right Deltoid Nml Nml Nml Nml Nml Nml Nml Nml N/A  Right C7 PSP Nml Nml Nml *1- *1+ *1+ Nml Nml N/A      Waveforms:  Motor            Sensory

## 2023-03-09 DIAGNOSIS — G4733 Obstructive sleep apnea (adult) (pediatric): Secondary | ICD-10-CM | POA: Diagnosis not present

## 2023-03-11 ENCOUNTER — Encounter: Payer: Self-pay | Admitting: Family Medicine

## 2023-03-11 NOTE — Progress Notes (Unsigned)
[name] [address] [place] [address] Phone: [phone] Subjective:   [name], am serving as a scribe for Dr. Antoine Primas.  I'm seeing this patient by the request  of:  [name], MD  CC: Multiple complaints  [id number]  01/21/2023 Mild arthritic changes noted.  Discussed icing regimen and home exercises.  Discussed which activities to do and which ones to avoid.  I do think she would be a candidate for viscosupplementation.  Will see if we can get approval.     Has had difficulty with this intermittently.  Will get nerve conduction test to see the severity and discuss further treatment options.      Update 03/12/2023 Katie Glass is a 47 y.o. female coming in with complaint of L carpal tunnel and right knee pain.  Follow-up on left knee pain as well.  Patient states that she would like L carpal tunnel injection.   L foot has been painful again over lateral aspect.    Patient recently did have a nerve conduction study that unfortunately did find severe carpal tunnel in the left side and mild on the right side.  Does have a right sided C8 radiculopathy also noted.     Past Medical History:  Diagnosis Date   Arthritis    knees   Asthma    as a child   Back pain    Food allergy    Tannin/sulfate in Wine and ciders, rash on face and hands   Joint pain    Lower extremity edema    MMT (medial meniscus tear)    right   Sleep apnea    CPAP nightly   Umbilical hernia    Past Surgical History:  Procedure Laterality Date   BILATERAL SALPINGECTOMY Bilateral 04/15/2018   Procedure: BILATERAL SALPINGECTOMY;  Surgeon: Carrington Clamp, MD;  Location: [facility] BIRTHING SUITES;  Service: Obstetrics;  Laterality: Bilateral;   CESAREAN SECTION  11/12/2010   Procedure: CESAREAN SECTION;  Surgeon: Almon Hercules;  Location: [facility];  Service: Gynecology;  Laterality: N/A;   CESAREAN SECTION N/A 04/15/2018   Procedure:  CESAREAN SECTION;  Surgeon: Carrington Clamp, MD;  Location: [facility] BIRTHING SUITES;  Service: Obstetrics;  Laterality: N/A;   KNEE ARTHROSCOPY Right 02/11/2017   Procedure: RIGHT KNEE ARTHROSCOPY WITH DEBRIDEMENT CHONDROMALACIA;  Surgeon: Gean Birchwood, MD;  Location: [facility];  Service: Orthopedics;  Laterality: Right;   Social History   Socioeconomic History   Marital status: Married    Spouse name: Tearria Mcdaniel   Number of children: Not on file   Years of education: Not on file   Highest education level: Not on file  Occupational History   Occupation: Pastor  Tobacco Use   Smoking status: Never   Smokeless tobacco: Never  Vaping Use   Vaping status: Never Used  Substance and Sexual Activity   Alcohol use: Yes    Comment: social   Drug use: No   Sexual activity: Yes    Birth control/protection: None  Other Topics Concern   Not on file  Social History Narrative   Not on file   Social Drivers of Health   Financial Resource Strain: Low Risk  (04/06/2018)   Overall Financial Resource Strain (CARDIA)    Difficulty of Paying Living Expenses: Not hard at all  Food Insecurity: No Food Insecurity (04/06/2018)   Hunger Vital Sign    Worried About Running Out of Food in the Last Year: Never  true    Ran Out of Food in the Last Year: Never true  Transportation Needs: Unknown (04/06/2018)   PRAPARE - Administrator, Civil Service (Medical): No    Lack of Transportation (Non-Medical): Not on file  Physical Activity: Not on file  Stress: Stress Concern Present (04/06/2018)   [employer] of Occupational Health - Occupational Stress Questionnaire    Feeling of Stress : Very much  Social Connections: Not on file   No Known Allergies Family History  Problem Relation Age of Onset   Hypertension Father    Hyperlipidemia Father    Heart disease Father    Cancer Maternal Grandfather    Heart disease Paternal Grandfather    Pulmonary embolism Mother     Alcohol abuse Mother     Current Outpatient Medications (Endocrine & Metabolic):    levonorgestrel (MIRENA) 20 MCG/24HR IUD, 1 each by Intrauterine route once.  Current Outpatient Medications (Cardiovascular):    nitroGLYCERIN (NITRO-DUR) 0.2 mg/hr patch, Apply 1/4 of a patch to skin once daily.   spironolactone (ALDACTONE) 50 MG tablet, Take 1 tablet (50 mg total) by mouth daily.  Current Outpatient Medications (Respiratory):    cetirizine (ZYRTEC) 10 MG tablet, Take 10 mg by mouth daily.  Current Outpatient Medications (Analgesics):    meloxicam (MOBIC) 15 MG tablet, TAKE 1 TABLET(15 MG) BY MOUTH DAILY   Current Outpatient Medications (Other):    Lisdexamfetamine Dimesylate (VYVANSE) 60 MG CHEW, Chew 60 mg by mouth every morning.   Multiple Vitamins-Minerals (WOMENS MULTI VITAMIN & MINERAL PO), Take 1 tablet by mouth daily.   venlafaxine XR (EFFEXOR XR) 37.5 MG 24 hr capsule, Take 1 capsule (37.5 mg total) by mouth daily with breakfast.   Vitamin D, Ergocalciferol, (DRISDOL) 1.25 MG (50000 UNIT) CAPS capsule, Take 1 capsule (50,000 Units total) by mouth every 7 (seven) days.   Reviewed prior external information including notes and imaging from  primary care provider As well as notes that were available from care everywhere and other healthcare systems.  Past medical history, social, surgical and family history all reviewed in electronic medical record.  No pertanent information unless stated regarding to the chief complaint.   Review of Systems:  No headache, visual changes, nausea, vomiting, diarrhea, constipation, dizziness, abdominal pain, skin rash, fevers, chills, night sweats, weight loss, swollen lymph nodes, body aches, joint swelling, chest pain, shortness of breath, mood changes. POSITIVE muscle aches  Objective  Blood pressure 110/76, pulse 95, height 5\' 6"  (1.676 m), weight 227 lb (103 kg), SpO2 99%, unknown if currently breastfeeding.   General: No apparent  distress alert and oriented x3 mood and affect normal, dressed appropriately.  HEENT: Pupils equal, extraocular movements intact  Respiratory: Patient's speak in full sentences and does not appear short of breath  Cardiovascular: No lower extremity edema, non tender, no erythema  Knee exam shows arthritis noted.   Wrist exam shows severe tenderness and positive Tinel's on the left side.  Back exam shows tightness noted in the paraspinal musculature.  Tightness around the neck also noted.  Negative straight leg test noted.  Osteopathic findings  C2 flexed rotated and side bent right C4 flexed rotated and side bent left C6 flexed rotated and side bent left   Procedure: Real-time Ultrasound Guided Injection of left carpal tunnel Device: GE Logiq Q7 Ultrasound guided injection is preferred based studies that show increased duration, increased effect, greater accuracy, decreased procedural pain, increased response rate with ultrasound guided versus blind injection.  Verbal  informed consent obtained.  Time-out conducted.  Noted no overlying erythema, induration, or other signs of local infection.  Skin prepped in a sterile fashion.  Local anesthesia: Topical Ethyl chloride.  With sterile technique and under real time ultrasound guidance:  median nerve visualized.  23g 5/8 inch needle inserted distal to proximal approach into nerve sheath. Pictures taken nfor needle placement. Patient did have injection of 0.5 cc of 0.5% Marcaine, and 0.5 cc of Kenalog 40 mg/dL. Completed without difficulty  Pain immediately resolved suggesting accurate placement of the medication.  Advised to call if fevers/chills, erythema, induration, drainage, or persistent bleeding.  Impression: Technically successful ultrasound guided injection.  After informed written and verbal consent, patient was seated on exam table. Right knee was prepped with alcohol swab and utilizing anterolateral approach, patient's right knee  space was injected with 48 mg per 3 mL of Monovisc (sodium hyaluronate) in a prefilled syringe was injected easily into the knee through a 22-gauge needle..Patient tolerated the procedure well without immediate complications.   Impression and Recommendations:    Cervical disc disorder with radiculopathy of cervical region Tightness   Left carpal tunnel syndrome Problem with exacerbation.  Nerve conduction study shows that they are severe overall.  Patient will continue to monitor.  Follow-up with me again 8 weeks.  Degenerative arthritis of right knee Patient given viscosupplementation today.  Hopeful that this will make a difference.  Discussed with patient about which activities to do and which ones to avoid.  Increase activity slowly.  Follow-up again in 6 to 8 weeks otherwise  The above documentation has been reviewed and is accurate and complete Judi Saa, DO

## 2023-03-12 ENCOUNTER — Ambulatory Visit: Payer: BC Managed Care – PPO | Admitting: Family Medicine

## 2023-03-12 ENCOUNTER — Encounter: Payer: Self-pay | Admitting: Family Medicine

## 2023-03-12 ENCOUNTER — Other Ambulatory Visit: Payer: Self-pay

## 2023-03-12 VITALS — BP 110/76 | HR 95 | Ht 66.0 in | Wt 227.0 lb

## 2023-03-12 DIAGNOSIS — M25532 Pain in left wrist: Secondary | ICD-10-CM | POA: Diagnosis not present

## 2023-03-12 DIAGNOSIS — M5010 Cervical disc disorder with radiculopathy, unspecified cervical region: Secondary | ICD-10-CM

## 2023-03-12 DIAGNOSIS — M1711 Unilateral primary osteoarthritis, right knee: Secondary | ICD-10-CM

## 2023-03-12 DIAGNOSIS — G5602 Carpal tunnel syndrome, left upper limb: Secondary | ICD-10-CM

## 2023-03-12 MED ORDER — HYALURONAN 88 MG/4ML IX SOSY
88.0000 mg | PREFILLED_SYRINGE | Freq: Once | INTRA_ARTICULAR | Status: AC
  Administered 2023-03-12: 88 mg via INTRA_ARTICULAR

## 2023-03-12 NOTE — Patient Instructions (Addendum)
injected L wrist today Injected L knee toay See me on 12/24 for R wrist injection

## 2023-03-12 NOTE — Assessment & Plan Note (Signed)
Tightness

## 2023-03-12 NOTE — Progress Notes (Unsigned)
[name] [address] [place] [address] Phone: [phone] Subjective:    I'm seeing this patient by the request  of:  [name], MD  CC: Right wrist pain  [id number]  Katie Glass is a 47 y.o. female coming in with complaint of R wrist pain. Here for carpal tunnel injection. Patient states contralateral side is improved but not perfect, thinking maybe will need surgical intervention.  States though that due to being more active recently and feels like he needs a potential injection on the contralateral side right side giving her more trouble now.      Past Medical History:  Diagnosis Date   Arthritis    knees   Asthma    as a child   Back pain    Food allergy    Tannin/sulfate in Wine and ciders, rash on face and hands   Joint pain    Lower extremity edema    MMT (medial meniscus tear)    right   Sleep apnea    CPAP nightly   Umbilical hernia    Past Surgical History:  Procedure Laterality Date   BILATERAL SALPINGECTOMY Bilateral 04/15/2018   Procedure: BILATERAL SALPINGECTOMY;  Surgeon: Carrington Clamp, MD;  Location: [facility];  Service: Obstetrics;  Laterality: Bilateral;   CESAREAN SECTION  11/12/2010   Procedure: CESAREAN SECTION;  Surgeon: Almon Hercules;  Location: [facility];  Service: Gynecology;  Laterality: N/A;   CESAREAN SECTION N/A 04/15/2018   Procedure: CESAREAN SECTION;  Surgeon: Carrington Clamp, MD;  Location: [facility] BIRTHING SUITES;  Service: Obstetrics;  Laterality: N/A;   KNEE ARTHROSCOPY Right 02/11/2017   Procedure: RIGHT KNEE ARTHROSCOPY WITH DEBRIDEMENT CHONDROMALACIA;  Surgeon: Gean Birchwood, MD;  Location: [facility];  Service: Orthopedics;  Laterality: Right;   Social History   Socioeconomic History   Marital status: Married    Spouse name: Dustina Rivenburg   Number of children: Not on file   Years of education: Not on file   Highest education level: Not on file   Occupational History   Occupation: Pastor  Tobacco Use   Smoking status: Never   Smokeless tobacco: Never  Vaping Use   Vaping status: Never Used  Substance and Sexual Activity   Alcohol use: Yes    Comment: social   Drug use: No   Sexual activity: Yes    Birth control/protection: None  Other Topics Concern   Not on file  Social History Narrative   Not on file   Social Drivers of Health   Financial Resource Strain: Low Risk  (04/06/2018)   Overall Financial Resource Strain (CARDIA)    Difficulty of Paying Living Expenses: Not hard at all  Food Insecurity: No Food Insecurity (04/06/2018)   Hunger Vital Sign    Worried About Running Out of Food in the Last Year: Never true    Ran Out of Food in the Last Year: Never true  Transportation Needs: Unknown (04/06/2018)   PRAPARE - Administrator, Civil Service (Medical): No    Lack of Transportation (Non-Medical): Not on file  Physical Activity: Not on file  Stress: Stress Concern Present (04/06/2018)   [employer] of Occupational Health - Occupational Stress Questionnaire    Feeling of Stress : Very much  Social Connections: Not on file   No Known Allergies Family History  Problem Relation Age of Onset   Hypertension Father    Hyperlipidemia Father    Heart disease  Father    Cancer Maternal Grandfather    Heart disease Paternal Grandfather    Pulmonary embolism Mother    Alcohol abuse Mother     Current Outpatient Medications (Endocrine & Metabolic):    levonorgestrel (MIRENA) 20 MCG/24HR IUD, 1 each by Intrauterine route once.  Current Outpatient Medications (Cardiovascular):    nitroGLYCERIN (NITRO-DUR) 0.2 mg/hr patch, Apply 1/4 of a patch to skin once daily.   spironolactone (ALDACTONE) 50 MG tablet, Take 1 tablet (50 mg total) by mouth daily.  Current Outpatient Medications (Respiratory):    cetirizine (ZYRTEC) 10 MG tablet, Take 10 mg by mouth daily.  Current Outpatient Medications  (Analgesics):    meloxicam (MOBIC) 15 MG tablet, TAKE 1 TABLET(15 MG) BY MOUTH DAILY   Current Outpatient Medications (Other):    Lisdexamfetamine Dimesylate (VYVANSE) 60 MG CHEW, Chew 60 mg by mouth every morning.   Multiple Vitamins-Minerals (WOMENS MULTI VITAMIN & MINERAL PO), Take 1 tablet by mouth daily.   venlafaxine XR (EFFEXOR XR) 37.5 MG 24 hr capsule, Take 1 capsule (37.5 mg total) by mouth daily with breakfast.   Vitamin D, Ergocalciferol, (DRISDOL) 1.25 MG (50000 UNIT) CAPS capsule, Take 1 capsule (50,000 Units total) by mouth every 7 (seven) days.   Reviewed prior external information including notes and imaging from  primary care provider As well as notes that were available from care everywhere and other healthcare systems.  Past medical history, social, surgical and family history all reviewed in electronic medical record.  No pertanent information unless stated regarding to the chief complaint.   Review of Systems:  No headache, visual changes, nausea, vomiting, diarrhea, constipation, dizziness, abdominal pain, skin rash, fevers, chills, night sweats, weight loss, swollen lymph nodes, body aches, joint swelling, chest pain, shortness of breath, mood changes. POSITIVE muscle aches  Objective  Blood pressure 116/76, height 5\' 6"  (1.676 m), unknown if currently breastfeeding.   General: No apparent distress alert and oriented x3 mood and affect normal, dressed appropriately.  HEENT: Pupils equal, extraocular movements intact  Respiratory: Patient's speak in full sentences and does not appear short of breath  Cardiovascular: No lower extremity edema, non tender, no erythema  Right wrist does have a positive Tinel's noted.  Good grip strength still noted.  No thenar eminence wasting  Procedure: Real-time Ultrasound Guided Injection of left  carpal tunnel Device: GE Logiq Q7 Ultrasound guided injection is preferred based studies that show increased duration, increased  effect, greater accuracy, decreased procedural pain, increased response rate with ultrasound guided versus blind injection.  Verbal informed consent obtained.  Time-out conducted.  Noted no overlying erythema, induration, or other signs of local infection.  Skin prepped in a sterile fashion.  Local anesthesia: Topical Ethyl chloride.  With sterile technique and under real time ultrasound guidance:  median nerve visualized.  23g 5/8 inch needle inserted distal to proximal approach into nerve sheath. Pictures taken nfor needle placement. Patient did have injection of 1 cc of 0.5% Marcaine, and 1 cc of Kenalog 40 mg/dL. Completed without difficulty  Pain immediately resolved suggesting accurate placement of the medication.  Advised to call if fevers/chills, erythema, induration, drainage, or persistent bleeding.  Impression: Technically successful ultrasound guided injection.    Impression and Recommendations:    The above documentation has been reviewed and is accurate and complete Judi Saa, DO

## 2023-03-12 NOTE — Assessment & Plan Note (Signed)
Patient given viscosupplementation today.  Hopeful that this will make a difference.  Discussed with patient about which activities to do and which ones to avoid.  Increase activity slowly.  Follow-up again in 6 to 8 weeks otherwise

## 2023-03-12 NOTE — Assessment & Plan Note (Signed)
Problem with exacerbation.  Nerve conduction study shows that they are severe overall.  Patient will continue to monitor.  Follow-up with me again 8 weeks.

## 2023-03-15 ENCOUNTER — Encounter: Payer: Self-pay | Admitting: Family Medicine

## 2023-03-16 DIAGNOSIS — Z9189 Other specified personal risk factors, not elsewhere classified: Secondary | ICD-10-CM | POA: Diagnosis not present

## 2023-03-16 DIAGNOSIS — R7301 Impaired fasting glucose: Secondary | ICD-10-CM | POA: Diagnosis not present

## 2023-03-16 DIAGNOSIS — Z6836 Body mass index (BMI) 36.0-36.9, adult: Secondary | ICD-10-CM | POA: Diagnosis not present

## 2023-03-16 DIAGNOSIS — F50811 Binge eating disorder, moderate: Secondary | ICD-10-CM | POA: Diagnosis not present

## 2023-03-17 ENCOUNTER — Other Ambulatory Visit: Payer: Self-pay

## 2023-03-17 ENCOUNTER — Encounter: Payer: Self-pay | Admitting: Family Medicine

## 2023-03-17 ENCOUNTER — Ambulatory Visit: Payer: BC Managed Care – PPO | Admitting: Family Medicine

## 2023-03-17 VITALS — BP 116/76 | Ht 66.0 in

## 2023-03-17 DIAGNOSIS — M79641 Pain in right hand: Secondary | ICD-10-CM | POA: Diagnosis not present

## 2023-03-17 DIAGNOSIS — G5601 Carpal tunnel syndrome, right upper limb: Secondary | ICD-10-CM

## 2023-03-17 NOTE — Assessment & Plan Note (Signed)
Chronic problem with exacerbation.  We discussed with repetitive activities, bracing and home exercises, follow-up with me again as needed for this problem.

## 2023-03-17 NOTE — Patient Instructions (Addendum)
Injected R hand today

## 2023-03-20 ENCOUNTER — Encounter: Payer: Self-pay | Admitting: Family Medicine

## 2023-03-20 ENCOUNTER — Ambulatory Visit: Payer: BC Managed Care – PPO | Admitting: Family Medicine

## 2023-03-20 VITALS — HR 87 | Ht 66.0 in

## 2023-03-20 DIAGNOSIS — M1712 Unilateral primary osteoarthritis, left knee: Secondary | ICD-10-CM | POA: Insufficient documentation

## 2023-03-20 MED ORDER — HYALURONAN 88 MG/4ML IX SOSY
88.0000 mg | PREFILLED_SYRINGE | Freq: Once | INTRA_ARTICULAR | Status: AC
  Administered 2023-03-20: 88 mg via INTRA_ARTICULAR

## 2023-03-20 NOTE — Assessment & Plan Note (Signed)
Patient has had difficulty with this previously.  Discussed icing regimen and home exercises.  Increase activity slowly.  Follow-up again in 6 to 8 weeks

## 2023-03-20 NOTE — Progress Notes (Signed)
Katie Glass [address] [place] [address] Phone: [phone] Subjective:   Katie Glass, am serving as a scribe for Dr. Antoine Glass.  I'm seeing this patient by the request  of:  Katie Joe, MD  CC: Left knee pain  [id number]  Katie Glass is a 47 y.o. female coming in with complaint of L knee pain. Patient here for visco injection because injection was very helpful for contralateral knee.        Past Medical History:  Diagnosis Date   Arthritis    knees   Asthma    as a child   Back pain    Food allergy    Tannin/sulfate in Wine and ciders, rash on face and hands   Joint pain    Lower extremity edema    MMT (medial meniscus tear)    right   Sleep apnea    CPAP nightly   Umbilical hernia    Past Surgical History:  Procedure Laterality Date   BILATERAL SALPINGECTOMY Bilateral 04/15/2018   Procedure: BILATERAL SALPINGECTOMY;  Surgeon: Katie Clamp, MD;  Location: [facility] BIRTHING SUITES;  Service: Obstetrics;  Laterality: Bilateral;   CESAREAN SECTION  11/12/2010   Procedure: CESAREAN SECTION;  Surgeon: Katie Glass;  Location: [facility];  Service: Gynecology;  Laterality: N/A;   CESAREAN SECTION N/A 04/15/2018   Procedure: CESAREAN SECTION;  Surgeon: Katie Clamp, MD;  Location: [facility] BIRTHING SUIT[facility];  Service: Obstetrics;  Laterality: N/A;   KNEE ARTHROSCOPY Right 02/11/2017   Procedure: RIGHT KNEE ARTHROSCOPY WITH DEBRIDEMENT CHONDROMALACIA;  Surgeon: Katie Birchwood, MD;  Location: [facility];  Service: Orthopedics;  Laterality: Right;   Social History   Socioeconomic History   Marital status: Married    Spouse name: Katie Glass   Number of children: Not on file   Years of education: Not on file   Highest education level: Not on file  Occupational History   Occupation: Pastor  Tobacco Use   Smoking status: Never   Smokeless tobacco: Never  Vaping Use   Vaping status: Never Used   Substance and Sexual Activity   Alcohol use: Yes    Comment: social   Drug use: No   Sexual activity: Yes    Birth control/protection: None  Other Topics Concern   Not on file  Social History Narrative   Not on file   Social Drivers of Health   Financial Resource Strain: Low Risk  (04/06/2018)   Overall Financial Resource Strain (CARDIA)    Difficulty of Paying Living Expenses: Not hard at all  Food Insecurity: No Food Insecurity (04/06/2018)   Hunger Vital Sign    Worried About Running Out of Food in the Last Year: Never true    Ran Out of Food in the Last Year: Never true  Transportation Needs: Unknown (04/06/2018)   PRAPARE - Administrator, Civil Service (Medical): No    Lack of Transportation (Non-Medical): Not on file  Physical Activity: Not on file  Stress: Stress Concern Present (04/06/2018)   [employer] of Occupational Health - Occupational Stress Questionnaire    Feeling of Stress : Very much  Social Connections: Not on file   No Known Allergies Family History  Problem Relation Age of Onset   Hypertension Father    Hyperlipidemia Father    Heart disease Father    Cancer Maternal Grandfather    Heart disease Paternal Grandfather    Pulmonary embolism Mother  Alcohol abuse Mother     Current Outpatient Medications (Endocrine & Metabolic):    levonorgestrel (MIRENA) 20 MCG/24HR IUD, 1 each by Intrauterine route once.  Current Outpatient Medications (Cardiovascular):    nitroGLYCERIN (NITRO-DUR) 0.2 mg/hr patch, Apply 1/4 of a patch to skin once daily.   spironolactone (ALDACTONE) 50 MG tablet, Take 1 tablet (50 mg total) by mouth daily.  Current Outpatient Medications (Respiratory):    cetirizine (ZYRTEC) 10 MG tablet, Take 10 mg by mouth daily.  Current Outpatient Medications (Analgesics):    meloxicam (MOBIC) 15 MG tablet, TAKE 1 TABLET(15 MG) BY MOUTH DAILY   Current Outpatient Medications (Other):    Lisdexamfetamine Dimesylate  (VYVANSE) 60 MG CHEW, Chew 60 mg by mouth every morning.   Multiple Vitamins-Minerals (WOMENS MULTI VITAMIN & MINERAL PO), Take 1 tablet by mouth daily.   venlafaxine XR (EFFEXOR XR) 37.5 MG 24 hr capsule, Take 1 capsule (37.5 mg total) by mouth daily with breakfast.   Vitamin D, Ergocalciferol, (DRISDOL) 1.25 MG (50000 UNIT) CAPS capsule, Take 1 capsule (50,000 Units total) by mouth every 7 (seven) days.   Reviewed prior external information including notes and imaging from  primary care provider As well as notes that were available from care everywhere and other healthcare systems.  Past medical history, social, surgical and family history all reviewed in electronic medical record.  No pertanent information unless stated regarding to the chief complaint.   Review of Systems:  No headache, visual changes, nausea, vomiting, diarrhea, constipation, dizziness, abdominal pain, skin rash, fevers, chills, night sweats, weight loss, swollen lymph nodes, body aches, joint swelling, chest pain, shortness of breath, mood changes. POSITIVE muscle aches  Objective  Pulse 87, height 5\' 6"  (1.676 m), SpO2 95%, unknown if currently breastfeeding.   General: No apparent distress alert and oriented x3 mood and affect normal, dressed appropriately.  Left knee does have some tenderness to palpation over the patellofemoral and the medial joint space  After informed written and verbal consent, patient was seated on exam table. Left knee was prepped with alcohol swab and utilizing anterolateral approach, patient's left knee space was injected with 40 mg per 3 mL of Monovisc (sodium hyaluronate) in a prefilled syringe was injected easily into the knee through a 22-gauge needle..Patient tolerated the procedure well without immediate complications.    Impression and Recommendations:    The above documentation has been reviewed and is accurate and complete Katie Saa, DO

## 2023-04-17 ENCOUNTER — Other Ambulatory Visit (HOSPITAL_COMMUNITY): Payer: Self-pay

## 2023-04-17 DIAGNOSIS — G4733 Obstructive sleep apnea (adult) (pediatric): Secondary | ICD-10-CM | POA: Diagnosis not present

## 2023-04-17 DIAGNOSIS — R632 Polyphagia: Secondary | ICD-10-CM | POA: Diagnosis not present

## 2023-04-17 DIAGNOSIS — F909 Attention-deficit hyperactivity disorder, unspecified type: Secondary | ICD-10-CM | POA: Diagnosis not present

## 2023-04-17 DIAGNOSIS — E559 Vitamin D deficiency, unspecified: Secondary | ICD-10-CM | POA: Diagnosis not present

## 2023-04-17 MED ORDER — LISDEXAMFETAMINE DIMESYLATE 60 MG PO CAPS
ORAL_CAPSULE | ORAL | 0 refills | Status: DC
  Filled 2023-04-17: qty 11, 11d supply, fill #0
  Filled 2023-04-17: qty 19, 19d supply, fill #0

## 2023-05-05 DIAGNOSIS — F4322 Adjustment disorder with anxiety: Secondary | ICD-10-CM | POA: Diagnosis not present

## 2023-05-07 ENCOUNTER — Other Ambulatory Visit (HOSPITAL_COMMUNITY): Payer: Self-pay

## 2023-05-07 MED ORDER — NYSTATIN-TRIAMCINOLONE 100000-0.1 UNIT/GM-% EX CREA
TOPICAL_CREAM | CUTANEOUS | 0 refills | Status: AC
  Filled 2023-05-07: qty 60, 20d supply, fill #0

## 2023-05-07 MED ORDER — METHYLPREDNISOLONE 4 MG PO TBPK
ORAL_TABLET | ORAL | 0 refills | Status: DC
  Filled 2023-05-07: qty 21, 6d supply, fill #0

## 2023-05-08 ENCOUNTER — Other Ambulatory Visit (HOSPITAL_COMMUNITY): Payer: Self-pay

## 2023-05-12 ENCOUNTER — Encounter: Payer: Self-pay | Admitting: Family Medicine

## 2023-05-13 NOTE — Progress Notes (Unsigned)
 [name] [address] [place] [address] Phone: [phone] Subjective:   [name], am serving as a scribe for Dr. Antoine Primas.  I'm seeing this patient by the request  of:  [name], MD  CC: right foot and ankle   [id number]  GABRIELLY MCCRYSTAL is a 48 y.o. female coming in with complaint of R foot pain. Uncomfortable to walk without pain in the mornings. Improved throughout the day. Pain near medial malleolus.  Pain for past week.      Past Medical History:  Diagnosis Date   Arthritis    knees   Asthma    as a child   Back pain    Food allergy    Tannin/sulfate in Wine and ciders, rash on face and hands   Joint pain    Lower extremity edema    MMT (medial meniscus tear)    right   Sleep apnea    CPAP nightly   Umbilical hernia    Past Surgical History:  Procedure Laterality Date   BILATERAL SALPINGECTOMY Bilateral 04/15/2018   Procedure: BILATERAL SALPINGECTOMY;  Surgeon: Carrington Clamp, MD;  Location: [facility] BIRTHING SUIT[facility];  Service: Obstetrics;  Laterality: Bilateral;   CESAREAN SECTION  11/12/2010   Procedure: CESAREAN SECTION;  Surgeon: Almon Hercules;  Location: [facility];  Service: Gynecology;  Laterality: N/A;   CESAREAN SECTION N/A 04/15/2018   Procedure: CESAREAN SECTION;  Surgeon: Carrington Clamp, MD;  Location: [facility] BIRTHING SUITES;  Service: Obstetrics;  Laterality: N/A;   KNEE ARTHROSCOPY Right 02/11/2017   Procedure: RIGHT KNEE ARTHROSCOPY WITH DEBRIDEMENT CHONDROMALACIA;  Surgeon: Gean Birchwood, MD;  Location: [facility];  Service: Orthopedics;  Laterality: Right;   Social History   Socioeconomic History   Marital status: Married    Spouse name: Gladyse Corvin   Number of children: Not on file   Years of education: Not on file   Highest education level: Not on file  Occupational History   Occupation: Pastor  Tobacco Use   Smoking status: Never   Smokeless tobacco: Never  Vaping  Use   Vaping status: Never Used  Substance and Sexual Activity   Alcohol use: Yes    Comment: social   Drug use: No   Sexual activity: Yes    Birth control/protection: None  Other Topics Concern   Not on file  Social History Narrative   Not on file   Social Drivers of Health   Financial Resource Strain: Low Risk  (04/06/2018)   Overall Financial Resource Strain (CARDIA)    Difficulty of Paying Living Expenses: Not hard at all  Food Insecurity: No Food Insecurity (04/06/2018)   Hunger Vital Sign    Worried About Running Out of Food in the Last Year: Never true    Ran Out of Food in the Last Year: Never true  Transportation Needs: Unknown (04/06/2018)   PRAPARE - Administrator, Civil Service (Medical): No    Lack of Transportation (Non-Medical): Not on file  Physical Activity: Not on file  Stress: Stress Concern Present (04/06/2018)   [employer] of Occupational Health - Occupational Stress Questionnaire    Feeling of Stress : Very much  Social Connections: Not on file   No Known Allergies Family History  Problem Relation Age of Onset   Hypertension Father    Hyperlipidemia Father    Heart disease Father    Cancer Maternal Grandfather    Heart disease Paternal Grandfather  Pulmonary embolism Mother    Alcohol abuse Mother     Current Outpatient Medications (Endocrine & Metabolic):    levonorgestrel (MIRENA) 20 MCG/24HR IUD, 1 each by Intrauterine route once.   methylPREDNISolone (MEDROL) 4 MG TBPK tablet, Take as directed  Current Outpatient Medications (Cardiovascular):    nitroGLYCERIN (NITRO-DUR) 0.2 mg/hr patch, Apply 1/4 of a patch to skin once daily.   spironolactone (ALDACTONE) 50 MG tablet, Take 1 tablet (50 mg total) by mouth daily.  Current Outpatient Medications (Respiratory):    cetirizine (ZYRTEC) 10 MG tablet, Take 10 mg by mouth daily.  Current Outpatient Medications (Analgesics):    meloxicam (MOBIC) 15 MG tablet, TAKE 1 TABLET(15  MG) BY MOUTH DAILY   Current Outpatient Medications (Other):    lisdexamfetamine (VYVANSE) 60 MG capsule, Take 1 capsule by mouth once daily.   Lisdexamfetamine Dimesylate (VYVANSE) 60 MG CHEW, Chew 60 mg by mouth every morning.   Multiple Vitamins-Minerals (WOMENS MULTI VITAMIN & MINERAL PO), Take 1 tablet by mouth daily.   nystatin-triamcinolone (MYCOLOG II) cream, Apply topically 2 times a day, as needed   venlafaxine XR (EFFEXOR XR) 37.5 MG 24 hr capsule, Take 1 capsule (37.5 mg total) by mouth daily with breakfast.   Vitamin D, Ergocalciferol, (DRISDOL) 1.25 MG (50000 UNIT) CAPS capsule, Take 1 capsule (50,000 Units total) by mouth every 7 (seven) days.   Reviewed prior external information including notes and imaging from  primary care provider As well as notes that were available from care everywhere and other healthcare systems.  Past medical history, social, surgical and family history all reviewed in electronic medical record.  No pertanent information unless stated regarding to the chief complaint.   Review of Systems:  No headache, visual changes, nausea, vomiting, diarrhea, constipation, dizziness, abdominal pain, skin rash, fevers, chills, night sweats, weight loss, swollen lymph nodes, body aches, joint swelling, chest pain, shortness of breath, mood changes. POSITIVE muscle aches  Objective  unknown if currently breastfeeding.   General: No apparent distress alert and oriented x3 mood and affect normal, dressed appropriately.  HEENT: Pupils equal, extraocular movements intact  Respiratory: Patient's speak in full sentences and does not appear short of breath  Cardiovascular: No lower extremity edema, non tender, no erythema  Ankle exam shows ttp on the right medial aspect of the ankle.  Patient does have some limited range of motion in dorsiflexion of the foot.  Patient has some pain with resisted inversion of the ankle.  Limited muscular skeletal ultrasound was  performed and interpreted by Antoine Primas, M  Limited ultrasound shows a patient does have some narrowing of the talonavicular joint as well as the ankle mortise on the medial aspect.  Patient also has what appears to be a hypoechoic cyst within the posterior tibialis tendon sheath.  No abnormal vascularity noted.  Noncompressible. Impression: Likely ganglion cyst in the posterior tibialis tendon sheath.  Procedure: Real-time Ultrasound Guided Injection of left posterior tibialis tendon sheath/ganglion cyst Device: GE Logiq Q7 Ultrasound guided injection is preferred based studies that show increased duration, increased effect, greater accuracy, decreased procedural pain, increased response rate, and decreased cost with ultrasound guided versus blind injection.  Verbal informed consent obtained.  Time-out conducted.  Noted no overlying erythema, induration, or other signs of local infection.  Skin prepped in a sterile fashion.  Local anesthesia: Topical Ethyl chloride.  With sterile technique and under real time ultrasound guidance: With a 25-gauge half inch needle injected with 1 cc of 0.5% Marcaine and 1  cc of Kenalog 40 mg/mL into the cyst from a medial approach. Completed without difficulty  Pain immediately resolved suggesting accurate placement of the medication.  Advised to call if fevers/chills, erythema, induration, drainage, or persistent bleeding.  Images saved Impression: Technically successful ultrasound guided injection.    Impression and Recommendations:     The above documentation has been reviewed and is accurate and complete Judi Saa, DO

## 2023-05-14 ENCOUNTER — Encounter: Payer: Self-pay | Admitting: Family Medicine

## 2023-05-14 ENCOUNTER — Ambulatory Visit: Payer: BC Managed Care – PPO | Admitting: Family Medicine

## 2023-05-14 ENCOUNTER — Other Ambulatory Visit: Payer: Self-pay

## 2023-05-14 VITALS — BP 122/86 | HR 92 | Ht 66.0 in

## 2023-05-14 DIAGNOSIS — M25571 Pain in right ankle and joints of right foot: Secondary | ICD-10-CM

## 2023-05-14 DIAGNOSIS — M71371 Other bursal cyst, right ankle and foot: Secondary | ICD-10-CM | POA: Diagnosis not present

## 2023-05-14 NOTE — Patient Instructions (Signed)
 Injection in foot

## 2023-05-14 NOTE — Assessment & Plan Note (Signed)
 Patient had a injection today.  Unable to do any aspiration in the area.  Discussed with patient about icing regimen and home exercises, discussed with patient about proper shoes.  Do believe that this is likely secondary to an injury of the deltoid ligament and 95 that causes patient to have more tension on the posterior tibialis tendon sheath.  The patient is doing everything the right wearing good shoes, orthotics, and continuing to work on her weight.  Follow-up with me again in 6 weeks if needed.

## 2023-05-15 DIAGNOSIS — R632 Polyphagia: Secondary | ICD-10-CM | POA: Diagnosis not present

## 2023-05-15 DIAGNOSIS — G4733 Obstructive sleep apnea (adult) (pediatric): Secondary | ICD-10-CM | POA: Diagnosis not present

## 2023-05-15 DIAGNOSIS — F909 Attention-deficit hyperactivity disorder, unspecified type: Secondary | ICD-10-CM | POA: Diagnosis not present

## 2023-05-18 ENCOUNTER — Other Ambulatory Visit (HOSPITAL_COMMUNITY): Payer: Self-pay

## 2023-05-18 MED ORDER — LISDEXAMFETAMINE DIMESYLATE 60 MG PO CAPS
60.0000 mg | ORAL_CAPSULE | Freq: Every day | ORAL | 0 refills | Status: DC
  Filled 2023-05-18: qty 30, 30d supply, fill #0

## 2023-05-18 MED ORDER — ZEPBOUND 15 MG/0.5ML ~~LOC~~ SOAJ
15.0000 mg | SUBCUTANEOUS | 1 refills | Status: DC
  Filled 2023-05-18 – 2023-10-10 (×5): qty 2, 28d supply, fill #0

## 2023-06-19 DIAGNOSIS — F909 Attention-deficit hyperactivity disorder, unspecified type: Secondary | ICD-10-CM | POA: Diagnosis not present

## 2023-06-19 DIAGNOSIS — E559 Vitamin D deficiency, unspecified: Secondary | ICD-10-CM | POA: Diagnosis not present

## 2023-06-19 DIAGNOSIS — R051 Acute cough: Secondary | ICD-10-CM | POA: Diagnosis not present

## 2023-06-19 DIAGNOSIS — I10 Essential (primary) hypertension: Secondary | ICD-10-CM | POA: Diagnosis not present

## 2023-06-20 ENCOUNTER — Other Ambulatory Visit (HOSPITAL_COMMUNITY): Payer: Self-pay

## 2023-06-20 MED ORDER — ZEPBOUND 15 MG/0.5ML ~~LOC~~ SOAJ
15.0000 mg | SUBCUTANEOUS | 1 refills | Status: DC
Start: 2023-06-19 — End: 2023-07-23
  Filled 2023-06-20 – 2023-07-20 (×5): qty 2, 28d supply, fill #0

## 2023-06-20 MED ORDER — ALBUTEROL SULFATE HFA 108 (90 BASE) MCG/ACT IN AERS
1.0000 | INHALATION_SPRAY | RESPIRATORY_TRACT | 0 refills | Status: AC | PRN
Start: 2023-06-19 — End: ?
  Filled 2023-06-20: qty 6.7, 34d supply, fill #0

## 2023-06-22 ENCOUNTER — Other Ambulatory Visit (HOSPITAL_COMMUNITY): Payer: Self-pay

## 2023-06-23 ENCOUNTER — Other Ambulatory Visit (HOSPITAL_COMMUNITY): Payer: Self-pay

## 2023-06-23 MED ORDER — LISDEXAMFETAMINE DIMESYLATE 60 MG PO CAPS
60.0000 mg | ORAL_CAPSULE | Freq: Every day | ORAL | 0 refills | Status: DC
  Filled 2023-06-23: qty 30, 30d supply, fill #0

## 2023-06-25 ENCOUNTER — Other Ambulatory Visit (HOSPITAL_COMMUNITY): Payer: Self-pay

## 2023-06-25 MED ORDER — LISDEXAMFETAMINE DIMESYLATE 60 MG PO CAPS
60.0000 mg | ORAL_CAPSULE | Freq: Every day | ORAL | 0 refills | Status: DC
  Filled 2023-07-20 – 2023-07-21 (×2): qty 30, 30d supply, fill #0

## 2023-06-26 ENCOUNTER — Other Ambulatory Visit (HOSPITAL_COMMUNITY): Payer: Self-pay

## 2023-07-04 ENCOUNTER — Other Ambulatory Visit (HOSPITAL_COMMUNITY): Payer: Self-pay

## 2023-07-07 ENCOUNTER — Other Ambulatory Visit (HOSPITAL_COMMUNITY): Payer: Self-pay

## 2023-07-10 ENCOUNTER — Other Ambulatory Visit (HOSPITAL_COMMUNITY): Payer: Self-pay

## 2023-07-13 ENCOUNTER — Other Ambulatory Visit (HOSPITAL_COMMUNITY): Payer: Self-pay

## 2023-07-20 ENCOUNTER — Other Ambulatory Visit (HOSPITAL_COMMUNITY): Payer: Self-pay

## 2023-07-20 ENCOUNTER — Other Ambulatory Visit: Payer: Self-pay

## 2023-07-21 ENCOUNTER — Other Ambulatory Visit (HOSPITAL_COMMUNITY): Payer: Self-pay

## 2023-07-21 ENCOUNTER — Other Ambulatory Visit: Payer: Self-pay

## 2023-07-22 ENCOUNTER — Other Ambulatory Visit (HOSPITAL_COMMUNITY): Payer: Self-pay

## 2023-07-23 ENCOUNTER — Other Ambulatory Visit: Payer: Self-pay

## 2023-07-23 ENCOUNTER — Other Ambulatory Visit (HOSPITAL_COMMUNITY): Payer: Self-pay

## 2023-07-23 MED ORDER — ZEPBOUND 15 MG/0.5ML ~~LOC~~ SOAJ
15.0000 mg | SUBCUTANEOUS | 1 refills | Status: DC
Start: 2023-07-23 — End: 2024-02-11
  Filled 2023-07-23: qty 4, 56d supply, fill #0
  Filled 2023-07-23: qty 6, 84d supply, fill #0
  Filled 2023-07-23: qty 2, 28d supply, fill #0
  Filled 2023-11-06: qty 6, 84d supply, fill #1

## 2023-07-23 MED ORDER — VENLAFAXINE HCL 75 MG PO TABS
75.0000 mg | ORAL_TABLET | Freq: Every day | ORAL | 1 refills | Status: DC
  Filled 2023-07-23: qty 90, 90d supply, fill #0
  Filled 2023-10-18: qty 90, 90d supply, fill #1

## 2023-07-24 ENCOUNTER — Other Ambulatory Visit: Payer: Self-pay

## 2023-07-29 ENCOUNTER — Other Ambulatory Visit (HOSPITAL_COMMUNITY): Payer: Self-pay

## 2023-08-04 DIAGNOSIS — F4322 Adjustment disorder with anxiety: Secondary | ICD-10-CM | POA: Diagnosis not present

## 2023-08-10 DIAGNOSIS — F909 Attention-deficit hyperactivity disorder, unspecified type: Secondary | ICD-10-CM | POA: Diagnosis not present

## 2023-08-10 DIAGNOSIS — R632 Polyphagia: Secondary | ICD-10-CM | POA: Diagnosis not present

## 2023-08-10 DIAGNOSIS — L987 Excessive and redundant skin and subcutaneous tissue: Secondary | ICD-10-CM | POA: Diagnosis not present

## 2023-08-10 DIAGNOSIS — L304 Erythema intertrigo: Secondary | ICD-10-CM | POA: Diagnosis not present

## 2023-08-11 NOTE — Progress Notes (Signed)
 [facility] [address] [place] [address] Phone: [phone] Subjective:   Katie Glass, am serving as a scribe for Dr. Ronnell Coins.  I'm seeing this patient by the request  of:  Rae Bugler, MD  CC: Neck pain  [id number]  05/14/2023 Patient had a injection today. Unable to do any aspiration in the area. Discussed with patient about icing regimen and home exercises, discussed with patient about proper shoes. Do believe that this is likely secondary to an injury of the deltoid ligament and 95 that causes patient to have more tension on the posterior tibialis tendon sheath. The patient is doing everything the right wearing good shoes, orthotics, and continuing to work on her weight. Follow-up with me again in 6 weeks if needed.   Updated 08/13/2023 Katie Glass is a 48 y.o. female coming in with complaint of neck pain. Patient states that L side of neck is painful and had trigger point near base of skull. Has limited ROM that is improving slowly. Denies any radiating symptoms.       Past Medical History:  Diagnosis Date   Arthritis    knees   Asthma    as a child   Back pain    Food allergy    Tannin/sulfate in Wine and ciders, rash on face and hands   Joint pain    Lower extremity edema    MMT (medial meniscus tear)    right   Sleep apnea    CPAP nightly   Umbilical hernia    Past Surgical History:  Procedure Laterality Date   BILATERAL SALPINGECTOMY Bilateral 04/15/2018   Procedure: BILATERAL SALPINGECTOMY;  Surgeon: Matt Song, MD;  Location: [facility] BIRTHING SUIT[facility];  Service: Obstetrics;  Laterality: Bilateral;   CESAREAN SECTION  11/12/2010   Procedure: CESAREAN SECTION;  Surgeon: Georgene Kingfisher;  Location: [facility];  Service: Gynecology;  Laterality: N/A;   CESAREAN SECTION N/A 04/15/2018   Procedure: CESAREAN SECTION;  Surgeon: Matt Song, MD;  Location: [facility] BIRTHING SUITES;  Service: Obstetrics;  Laterality:  N/A;   KNEE ARTHROSCOPY Right 02/11/2017   Procedure: RIGHT KNEE ARTHROSCOPY WITH DEBRIDEMENT CHONDROMALACIA;  Surgeon: Wendolyn Hamburger, MD;  Location: [facility];  Service: Orthopedics;  Laterality: Right;   Social History   Socioeconomic History   Marital status: Married    Spouse name: Margean Korell   Number of children: Not on file   Years of education: Not on file   Highest education level: Not on file  Occupational History   Occupation: Pastor  Tobacco Use   Smoking status: Never   Smokeless tobacco: Never  Vaping Use   Vaping status: Never Used  Substance and Sexual Activity   Alcohol use: Yes    Comment: social   Drug use: No   Sexual activity: Yes    Birth control/protection: None  Other Topics Concern   Not on file  Social History Narrative   Not on file   Social Drivers of Health   Financial Resource Strain: Low Risk  (04/06/2018)   Overall Financial Resource Strain (CARDIA)    Difficulty of Paying Living Expenses: Not hard at all  Food Insecurity: No Food Insecurity (04/06/2018)   Hunger Vital Sign    Worried About Running Out of Food in the Last Year: Never true    Ran Out of Food in the Last Year: Never true  Transportation Needs: Unknown (04/06/2018)   PRAPARE - Transportation    Lack  of Transportation (Medical): No    Lack of Transportation (Non-Medical): Not on file  Physical Activity: Not on file  Stress: Stress Concern Present (04/06/2018)   [employer] of Occupational Health - Occupational Stress Questionnaire    Feeling of Stress : Very much  Social Connections: Not on file   No Known Allergies Family History  Problem Relation Age of Onset   Hypertension Father    Hyperlipidemia Father    Heart disease Father    Cancer Maternal Grandfather    Heart disease Paternal Grandfather    Pulmonary embolism Mother    Alcohol abuse Mother     Current Outpatient Medications (Endocrine & Metabolic):    levonorgestrel  (MIRENA ) 20  MCG/24HR IUD, 1 each by Intrauterine route once.   methylPREDNISolone  (MEDROL ) 4 MG TBPK tablet, Take as directed  Current Outpatient Medications (Cardiovascular):    nitroGLYCERIN  (NITRO-DUR ) 0.2 mg/hr patch, Apply 1/4 of a patch to skin once daily.   spironolactone  (ALDACTONE ) 50 MG tablet, Take 1 tablet (50 mg total) by mouth daily.  Current Outpatient Medications (Respiratory):    albuterol  (VENTOLIN  HFA) 108 (90 Base) MCG/ACT inhaler, Inhale 1 puff into the lungs every 4 (four) hours as needed.   cetirizine (ZYRTEC) 10 MG tablet, Take 10 mg by mouth daily.  Current Outpatient Medications (Analgesics):    meloxicam  (MOBIC ) 15 MG tablet, TAKE 1 TABLET(15 MG) BY MOUTH DAILY   Current Outpatient Medications (Other):    lisdexamfetamine (VYVANSE ) 60 MG capsule, Take 1 capsule (60 mg total) by mouth daily.   lisdexamfetamine (VYVANSE ) 70 MG capsule, Take 1 capsule (70 mg total) by mouth daily.   Lisdexamfetamine Dimesylate  (VYVANSE ) 60 MG CHEW, Chew 60 mg by mouth every morning.   Multiple Vitamins-Minerals (WOMENS MULTI VITAMIN & MINERAL PO), Take 1 tablet by mouth daily.   nystatin  ointment (MYCOSTATIN ), Apply topically 2 (two) times daily.   nystatin -triamcinolone  (MYCOLOG II) cream, Apply topically 2 times a day, as needed   tirzepatide  (ZEPBOUND ) 15 MG/0.5ML Pen, Inject 15 mg into the skin once a week.   tirzepatide  (ZEPBOUND ) 15 MG/0.5ML Pen, Inject 15 mg into the skin once a week.   venlafaxine  (EFFEXOR ) 75 MG tablet, Take 1 tablet (75 mg total) by mouth daily.   venlafaxine  XR (EFFEXOR  XR) 37.5 MG 24 hr capsule, Take 1 capsule (37.5 mg total) by mouth daily with breakfast.   Vitamin D , Ergocalciferol , (DRISDOL ) 1.25 MG (50000 UNIT) CAPS capsule, Take 1 capsule (50,000 Units total) by mouth every 7 (seven) days.   Reviewed prior external information including notes and imaging from  primary care provider As well as notes that were available from care everywhere and other  healthcare systems.  Past medical history, social, surgical and family history all reviewed in electronic medical record.  No pertanent information unless stated regarding to the chief complaint.   Review of Systems:  No headache, visual changes, nausea, vomiting, diarrhea, constipation, dizziness, abdominal pain, skin rash, fevers, chills, night sweats, weight loss, swollen lymph nodes, body aches, joint swelling, chest pain, shortness of breath, mood changes. POSITIVE muscle aches  Objective  Blood pressure 112/78, height 5\' 6"  (1.676 m), weight 238 lb (108 kg), unknown if currently breastfeeding.   General: No apparent distress alert and oriented x3 mood and affect normal, dressed appropriately.  HEENT: Pupils equal, extraocular movements intact  Respiratory: Patient's speak in full sentences and does not appear short of breath  Cardiovascular: No lower extremity edema, non tender, no erythema     Neck exam  does have some loss of lordosis.  Significant tightness and some trigger points noted in the right parascapular area.  Osteopathic findings C2 flexed rotated and side bent right C4 flexed rotated and side bent left C6 flexed rotated and side bent right  T3 extended rotated and side bent right inhaled third rib T9 extended rotated and side bent left     Impression and Recommendations:     Cervical disc disorder with radiculopathy of cervical region   Chronic problem,  Exacerbation, right side, after evaluation responded well to OMT,  Home exercises given, discussed posture and ergonomics, follow-up in 6 to 8 weeks     Decision today to treat with OMT was based on Physical Exam  After verbal consent patient was treated with HVLA, ME, FPR techniques in cervical, thoracic, rib, areas, all areas are chronic   Patient tolerated the procedure well with improvement in symptoms  Patient given exercises, stretches and lifestyle modifications  See medications in patient  instructions if given  Patient will follow up in 4-8 weeks The above documentation has been reviewed and is accurate and complete Glenys Snader M Morgen Linebaugh, DO

## 2023-08-13 ENCOUNTER — Encounter: Payer: Self-pay | Admitting: Family Medicine

## 2023-08-13 ENCOUNTER — Other Ambulatory Visit (HOSPITAL_COMMUNITY): Payer: Self-pay

## 2023-08-13 ENCOUNTER — Ambulatory Visit: Admitting: Family Medicine

## 2023-08-13 VITALS — BP 112/78 | Ht 66.0 in | Wt 238.0 lb

## 2023-08-13 DIAGNOSIS — M5010 Cervical disc disorder with radiculopathy, unspecified cervical region: Secondary | ICD-10-CM

## 2023-08-13 DIAGNOSIS — M9908 Segmental and somatic dysfunction of rib cage: Secondary | ICD-10-CM

## 2023-08-13 DIAGNOSIS — M9902 Segmental and somatic dysfunction of thoracic region: Secondary | ICD-10-CM

## 2023-08-13 DIAGNOSIS — M9901 Segmental and somatic dysfunction of cervical region: Secondary | ICD-10-CM | POA: Diagnosis not present

## 2023-08-13 MED ORDER — LISDEXAMFETAMINE DIMESYLATE 70 MG PO CAPS
70.0000 mg | ORAL_CAPSULE | Freq: Every day | ORAL | 0 refills | Status: DC
  Filled 2023-08-13: qty 30, 30d supply, fill #0

## 2023-08-13 MED ORDER — NYSTATIN 100000 UNIT/GM EX OINT
1.0000 | TOPICAL_OINTMENT | Freq: Two times a day (BID) | CUTANEOUS | 1 refills | Status: DC
Start: 2023-08-13 — End: 2024-02-11
  Filled 2023-08-13: qty 30, 15d supply, fill #0

## 2023-08-13 NOTE — Assessment & Plan Note (Signed)
 Chronic problem,  Exacerbation, right side, after evaluation responded well to OMT,  Home exercises given, discussed posture and ergonomics, follow-up in 6 to 8 weeks

## 2023-08-26 ENCOUNTER — Other Ambulatory Visit: Payer: Self-pay

## 2023-08-26 ENCOUNTER — Other Ambulatory Visit (INDEPENDENT_AMBULATORY_CARE_PROVIDER_SITE_OTHER): Payer: Self-pay

## 2023-08-26 ENCOUNTER — Other Ambulatory Visit (HOSPITAL_COMMUNITY): Payer: Self-pay

## 2023-09-01 ENCOUNTER — Other Ambulatory Visit (HOSPITAL_COMMUNITY): Payer: Self-pay

## 2023-09-01 MED ORDER — VITAMIN D (ERGOCALCIFEROL) 1.25 MG (50000 UNIT) PO CAPS
50000.0000 [IU] | ORAL_CAPSULE | ORAL | 0 refills | Status: DC
  Filled 2023-09-01: qty 12, 84d supply, fill #0

## 2023-09-01 MED ORDER — SPIRONOLACTONE 50 MG PO TABS
50.0000 mg | ORAL_TABLET | Freq: Every day | ORAL | 0 refills | Status: DC
  Filled 2023-09-01: qty 90, 90d supply, fill #0

## 2023-09-03 ENCOUNTER — Encounter: Payer: Self-pay | Admitting: Plastic Surgery

## 2023-09-03 ENCOUNTER — Ambulatory Visit: Admitting: Plastic Surgery

## 2023-09-03 VITALS — BP 134/88 | HR 79 | Ht 66.0 in | Wt 233.8 lb

## 2023-09-03 DIAGNOSIS — M793 Panniculitis, unspecified: Secondary | ICD-10-CM

## 2023-09-03 NOTE — Progress Notes (Addendum)
 Referring Provider Prentiss Frieze, DO [address] [place],  [place] [address]   CC:  Chief Complaint  Patient presents with   Advice Only      Katie Glass is an 48 y.o. female.  HPI: Katie Glass is a 48 year old female who is referred from her primary care provider regarding excess skin and fat on the lower portion of the abdomen.  She has had difficulty with the excess skin and fat since the birth of her son 5 years ago.  She has been undergoing medical weight loss management really 50 pound weight loss.  She has a prior C-section incision and now has excess skin which hangs over the C-section incision.  The tissue often becomes inflamed and irritated on the posterior aspect of the pannus.  She has been noted to in appearance with her daily activities including toileting and personal hygiene due to this excess skin and fat.  Additionally it is difficult for her to find close which fit appropriately due to the excess skin and fat.  She would like to have it excised.  No Known Allergies  Outpatient Encounter Medications as of 09/03/2023  Medication Sig   albuterol  (VENTOLIN  HFA) 108 (90 Base) MCG/ACT inhaler Inhale 1 puff into the lungs every 4 (four) hours as needed.   cetirizine (ZYRTEC) 10 MG tablet Take 10 mg by mouth daily.   levonorgestrel  (MIRENA ) 20 MCG/24HR IUD 1 each by Intrauterine route once.   lisdexamfetamine (VYVANSE ) 60 MG capsule Take 1 capsule (60 mg total) by mouth daily.   lisdexamfetamine (VYVANSE ) 70 MG capsule Take 1 capsule (70 mg total) by mouth daily.   Lisdexamfetamine Dimesylate  (VYVANSE ) 60 MG CHEW Chew 60 mg by mouth every morning.   meloxicam  (MOBIC ) 15 MG tablet TAKE 1 TABLET(15 MG) BY MOUTH DAILY   methylPREDNISolone  (MEDROL ) 4 MG TBPK tablet Take as directed   Multiple Vitamins-Minerals (WOMENS MULTI VITAMIN & MINERAL PO) Take 1 tablet by mouth daily.   nitroGLYCERIN  (NITRO-DUR ) 0.2 mg/hr patch Apply 1/4 of a patch to skin once daily.   nystatin   ointment (MYCOSTATIN ) Apply topically 2 (two) times daily.   nystatin -triamcinolone  (MYCOLOG II) cream Apply topically 2 times a day, as needed   spironolactone  (ALDACTONE ) 50 MG tablet Take 1 tablet (50 mg total) by mouth daily.   tirzepatide  (ZEPBOUND ) 15 MG/0.5ML Pen Inject 15 mg into the skin once a week.   tirzepatide  (ZEPBOUND ) 15 MG/0.5ML Pen Inject 15 mg into the skin once a week.   venlafaxine  (EFFEXOR ) 75 MG tablet Take 1 tablet (75 mg total) by mouth daily.   venlafaxine  XR (EFFEXOR  XR) 37.5 MG 24 hr capsule Take 1 capsule (37.5 mg total) by mouth daily with breakfast.   Vitamin D , Ergocalciferol , (DRISDOL ) 1.25 MG (50000 UNIT) CAPS capsule Take 1 capsule (50,000 Units total) by mouth every 7 (seven) days.   Vitamin D , Ergocalciferol , (DRISDOL ) 1.25 MG (50000 UNIT) CAPS capsule Take 1 capsule (50,000 Units total) by mouth once a week.   No facility-administered encounter medications on file as of 09/03/2023.     Past Medical History:  Diagnosis Date   Arthritis    knees   Asthma    as a child   Back pain    Food allergy    Tannin/sulfate in Wine and ciders, rash on face and hands   Joint pain    Lower extremity edema    MMT (medial meniscus tear)    right   Sleep apnea    CPAP  nightly   Umbilical hernia     Past Surgical History:  Procedure Laterality Date   BILATERAL SALPINGECTOMY Bilateral 04/15/2018   Procedure: BILATERAL SALPINGECTOMY;  Surgeon: Sarrah Browning, MD;  Location: [facility] BIRTHING SUIT[facility];  Service: Obstetrics;  Laterality: Bilateral;   CESAREAN SECTION  11/12/2010   Procedure: CESAREAN SECTION;  Surgeon: Marjorie HILARIO Gull;  Location: [facility];  Service: Gynecology;  Laterality: N/A;   CESAREAN SECTION N/A 04/15/2018   Procedure: CESAREAN SECTION;  Surgeon: Sarrah Browning, MD;  Location: [facility] BIRTHING SUITES;  Service: Obstetrics;  Laterality: N/A;   KNEE ARTHROSCOPY Right 02/11/2017   Procedure: RIGHT KNEE ARTHROSCOPY WITH DEBRIDEMENT CHONDROMALACIA;  Surgeon:  Liam Lerner, MD;  Location: [facility];  Service: Orthopedics;  Laterality: Right;    Family History  Problem Relation Age of Onset   Hypertension Father    Hyperlipidemia Father    Heart disease Father    Cancer Maternal Grandfather    Heart disease Paternal Grandfather    Pulmonary embolism Mother    Alcohol abuse Mother     Social History   Social History Narrative   Not on file     Review of Systems General: Denies fevers, chills, weight loss CV: Denies chest pain, shortness of breath, palpitations Abdomen: Patient notes excess skin of back which holds over her C-section incision and is frequently inflamed.  Physical Exam    09/03/2023    3:25 PM 08/13/2023    1:31 PM 05/14/2023   10:25 AM  Vitals with BMI  Height 5' 6 5' 6 5' 6  Weight 233 lbs 13 oz 238 lbs   BMI 37.75 38.43   Systolic 134 112 877  Diastolic 88 78 86  Pulse 79  92    General:  No acute distress,  Alert and oriented, Non-Toxic, Normal speech and affect Abdomen: Patient has a moderate pannus which extends down to and slightly past her symphysis pubis.  She does have a tattoo which falls fully in pannus. Mammogram: Mammogram 2022 BI-RADS 1 Assessment/Plan Panas: Patient has excess skin of fat on the anterior abdominal wall which she states frequently becomes infected and has rashes.  It interferes with toileting and personal hygiene.  She would like to have the skin and fat removed.  I showed her the location of the incisions and what I anticipate being removed.  This will include removal of her entire tattoo.  We discussed the risks of bleeding, infection, and seroma formation.  She understands she will have 2 drains which will be in place for up to 4 weeks.  She will also need to wear a compressive garment for 6 weeks postoperatively.  Her postoperative limitations include no heavy lifting greater than 20 pounds, no vigorous activity, no submerging incisions in water for 6 weeks.  She  may return to light activity as tolerated and she will be asked to begin ambulation immediately after surgery to help decrease the risk of DVT.  All questions were answered to her satisfaction.  Photographs were obtained today with her consent.  Will submit her for a panniculectomy at her request.  Addendum: Asked to provide a grade for the patient's pannus.  The pannus extends to the thigh crease but does not cover the genitals so it would be between a grade 1 and a grade 2.  Katie Glass 09/03/2023, 3:50 PM

## 2023-09-14 DIAGNOSIS — F909 Attention-deficit hyperactivity disorder, unspecified type: Secondary | ICD-10-CM | POA: Diagnosis not present

## 2023-09-14 DIAGNOSIS — I10 Essential (primary) hypertension: Secondary | ICD-10-CM | POA: Diagnosis not present

## 2023-09-14 DIAGNOSIS — R632 Polyphagia: Secondary | ICD-10-CM | POA: Diagnosis not present

## 2023-09-14 DIAGNOSIS — G4733 Obstructive sleep apnea (adult) (pediatric): Secondary | ICD-10-CM | POA: Diagnosis not present

## 2023-09-17 ENCOUNTER — Other Ambulatory Visit (HOSPITAL_COMMUNITY): Payer: Self-pay

## 2023-09-21 ENCOUNTER — Other Ambulatory Visit (HOSPITAL_COMMUNITY): Payer: Self-pay

## 2023-09-21 ENCOUNTER — Ambulatory Visit: Admitting: Family Medicine

## 2023-09-21 ENCOUNTER — Encounter: Payer: Self-pay | Admitting: Family Medicine

## 2023-09-21 VITALS — BP 110/80 | Ht 66.0 in | Wt 228.0 lb

## 2023-09-21 DIAGNOSIS — Z1231 Encounter for screening mammogram for malignant neoplasm of breast: Secondary | ICD-10-CM | POA: Diagnosis not present

## 2023-09-21 DIAGNOSIS — M9902 Segmental and somatic dysfunction of thoracic region: Secondary | ICD-10-CM | POA: Diagnosis not present

## 2023-09-21 DIAGNOSIS — M5010 Cervical disc disorder with radiculopathy, unspecified cervical region: Secondary | ICD-10-CM | POA: Diagnosis not present

## 2023-09-21 DIAGNOSIS — M9904 Segmental and somatic dysfunction of sacral region: Secondary | ICD-10-CM | POA: Diagnosis not present

## 2023-09-21 DIAGNOSIS — M9903 Segmental and somatic dysfunction of lumbar region: Secondary | ICD-10-CM

## 2023-09-21 DIAGNOSIS — M9908 Segmental and somatic dysfunction of rib cage: Secondary | ICD-10-CM | POA: Diagnosis not present

## 2023-09-21 DIAGNOSIS — M9901 Segmental and somatic dysfunction of cervical region: Secondary | ICD-10-CM

## 2023-09-21 DIAGNOSIS — Z01419 Encounter for gynecological examination (general) (routine) without abnormal findings: Secondary | ICD-10-CM | POA: Diagnosis not present

## 2023-09-21 DIAGNOSIS — Z124 Encounter for screening for malignant neoplasm of cervix: Secondary | ICD-10-CM | POA: Diagnosis not present

## 2023-09-21 NOTE — Assessment & Plan Note (Signed)
 Increasing discomfort, seems to be going on the left greater than right again.  Could consider the possibility of trigger point but hold at this time.  Discussed icing regimen of home exercises, discussed which activities to do and which ones to avoid.  Increase activity slowly.  Discussed icing regimen.  Follow-up again in 6 to 8 weeks.  Responds well to osteopathic manipulation.  No change in medications today.

## 2023-09-21 NOTE — Progress Notes (Signed)
 [name] [address] [place] [address] Phone: [phone] Subjective:   Katie Glass, am serving as a scribe for Dr. Arthea Claudene.  I'm seeing this patient by the request  of:  Seabron Lenis, MD  CC: back and neck pain   YEP:Dlagzrupcz  Katie Glass is a 48 y.o. female coming in with complaint of back and neck pain. Patient states that she has some pain in L trap and has pinpointed most of her pain to posture while sleeping. Last week both hands hand some tingling in them. Not sure if this is neck or carpal tunnel related.   Medications patient has been prescribed:   Taking:         Reviewed prior external information including notes and imaging from previsou exam, outside providers and external EMR if available.   As well as notes that were available from care everywhere and other healthcare systems.  Past medical history, social, surgical and family history all reviewed in electronic medical record.  No pertanent information unless stated regarding to the chief complaint.   Past Medical History:  Diagnosis Date   Arthritis    knees   Asthma    as a child   Back pain    Food allergy    Tannin/sulfate in Wine and ciders, rash on face and hands   Joint pain    Lower extremity edema    MMT (medial meniscus tear)    right   Sleep apnea    CPAP nightly   Umbilical hernia     No Known Allergies   Review of Systems:  No headache, visual changes, nausea, vomiting, diarrhea, constipation, dizziness, abdominal pain, skin rash, fevers, chills, night sweats, weight loss, swollen lymph nodes, body aches, joint swelling, chest pain, shortness of breath, mood changes. POSITIVE muscle aches  Objective  Blood pressure 110/80, height 5' 6 (1.676 m), weight 228 lb (103.4 kg), unknown if currently breastfeeding.   General: No apparent distress alert and oriented x3 mood and affect normal, dressed appropriately.  HEENT: Pupils  equal, extraocular movements intact  Respiratory: Patient's speak in full sentences and does not appear short of breath  Cardiovascular: No lower extremity edema, non tender, no erythema  Neck exam does have some loss lordosis.  There are some tenderness to palpation of the paraspinal musculature.  Tightness with Deri right greater than left.  Patient does have some tightness in the neck with sidebending  Osteopathic findings  C2 flexed rotated and side bent right C6 flexed rotated and side bent right T3 extended rotated and side bent right inhaled rib T9 extended rotated and side bent left L2 flexed rotated and side bent right Sacrum right on right       Assessment and Plan:  Cervical disc disorder with radiculopathy of cervical region Increasing discomfort, seems to be going on the left greater than right again.  Could consider the possibility of trigger point but hold at this time.  Discussed icing regimen of home exercises, discussed which activities to do and which ones to avoid.  Increase activity slowly.  Discussed icing regimen.  Follow-up again in 6 to 8 weeks.  Responds well to osteopathic manipulation.  No change in medications today.    Nonallopathic problems  Decision today to treat with OMT was based on Physical Exam  After verbal consent patient was treated with HVLA, ME, FPR techniques in cervical, rib, thoracic, lumbar, and sacral  areas  Patient tolerated the  procedure well with improvement in symptoms  Patient given exercises, stretches and lifestyle modifications  See medications in patient instructions if given  Patient will follow up in 4-8 weeks    The above documentation has been reviewed and is accurate and complete Nicola Quesnell M Harutyun Monteverde, DO          Note: This dictation was prepared with Dragon dictation along with smaller phrase technology. Any transcriptional errors that result from this process are unintentional.

## 2023-09-22 ENCOUNTER — Other Ambulatory Visit (HOSPITAL_COMMUNITY): Payer: Self-pay

## 2023-09-22 MED ORDER — LISDEXAMFETAMINE DIMESYLATE 70 MG PO CAPS
70.0000 mg | ORAL_CAPSULE | Freq: Every morning | ORAL | 0 refills | Status: DC
  Filled 2023-09-22: qty 30, 30d supply, fill #0

## 2023-09-22 MED ORDER — LISDEXAMFETAMINE DIMESYLATE 70 MG PO CAPS
70.0000 mg | ORAL_CAPSULE | Freq: Every morning | ORAL | 0 refills | Status: DC
  Filled 2023-09-22 – 2023-10-30 (×4): qty 30, 30d supply, fill #0

## 2023-09-22 MED ORDER — LISDEXAMFETAMINE DIMESYLATE 70 MG PO CAPS
70.0000 mg | ORAL_CAPSULE | Freq: Every morning | ORAL | 0 refills | Status: DC
  Filled 2023-09-22 – 2023-12-05 (×2): qty 30, 30d supply, fill #0

## 2023-09-23 ENCOUNTER — Other Ambulatory Visit (HOSPITAL_COMMUNITY): Payer: Self-pay

## 2023-09-24 DIAGNOSIS — F4322 Adjustment disorder with anxiety: Secondary | ICD-10-CM | POA: Diagnosis not present

## 2023-10-01 ENCOUNTER — Ambulatory Visit: Admitting: Family Medicine

## 2023-10-08 DIAGNOSIS — R632 Polyphagia: Secondary | ICD-10-CM | POA: Diagnosis not present

## 2023-10-08 DIAGNOSIS — R112 Nausea with vomiting, unspecified: Secondary | ICD-10-CM | POA: Diagnosis not present

## 2023-10-08 DIAGNOSIS — F909 Attention-deficit hyperactivity disorder, unspecified type: Secondary | ICD-10-CM | POA: Diagnosis not present

## 2023-10-08 DIAGNOSIS — I10 Essential (primary) hypertension: Secondary | ICD-10-CM | POA: Diagnosis not present

## 2023-10-10 ENCOUNTER — Other Ambulatory Visit (HOSPITAL_COMMUNITY): Payer: Self-pay

## 2023-10-12 ENCOUNTER — Other Ambulatory Visit (HOSPITAL_COMMUNITY): Payer: Self-pay

## 2023-10-12 MED ORDER — METOCLOPRAMIDE HCL 5 MG PO TABS
ORAL_TABLET | ORAL | 0 refills | Status: DC
  Filled 2023-10-12: qty 60, 30d supply, fill #0

## 2023-10-16 ENCOUNTER — Ambulatory Visit: Admitting: Family Medicine

## 2023-10-19 ENCOUNTER — Other Ambulatory Visit (HOSPITAL_COMMUNITY): Payer: Self-pay

## 2023-10-20 ENCOUNTER — Other Ambulatory Visit (HOSPITAL_BASED_OUTPATIENT_CLINIC_OR_DEPARTMENT_OTHER): Payer: Self-pay

## 2023-10-22 ENCOUNTER — Ambulatory Visit: Admitting: Family Medicine

## 2023-10-22 DIAGNOSIS — F4322 Adjustment disorder with anxiety: Secondary | ICD-10-CM | POA: Diagnosis not present

## 2023-10-24 ENCOUNTER — Other Ambulatory Visit (HOSPITAL_COMMUNITY): Payer: Self-pay

## 2023-10-30 ENCOUNTER — Other Ambulatory Visit (HOSPITAL_COMMUNITY): Payer: Self-pay

## 2023-10-30 DIAGNOSIS — G4733 Obstructive sleep apnea (adult) (pediatric): Secondary | ICD-10-CM | POA: Diagnosis not present

## 2023-11-12 NOTE — Progress Notes (Unsigned)
 [facility] [address] [place] [address] Phone: [phone] Subjective:   ISusannah Gully, am serving as a scribe for Dr. Arthea Claudene.  I'm seeing this patient by the request  of:  Seabron Lenis, MD  CC: Back and neck pain, foot pain  YEP:Dlagzrupcz  LAKESHA LEVINSON is a 48 y.o. female coming in with complaint of back and neck pain. OMT 09/21/2023. Patient states doing well. Neck is bothersome. Having some numbness in hands. Pain in lateral R foot for about the last 10 days. L side neck shoulder pain.  Medications patient has been prescribed: None  Taking:         Reviewed prior external information including notes and imaging from previsou exam, outside providers and external EMR if available.   As well as notes that were available from care everywhere and other healthcare systems.  Past medical history, social, surgical and family history all reviewed in electronic medical record.  No pertanent information unless stated regarding to the chief complaint.   Past Medical History:  Diagnosis Date   Arthritis    knees   Asthma    as a child   Back pain    Food allergy    Tannin/sulfate in Wine and ciders, rash on face and hands   Joint pain    Lower extremity edema    MMT (medial meniscus tear)    right   Sleep apnea    CPAP nightly   Umbilical hernia     No Known Allergies   Review of Systems:  No headache, visual changes, nausea, vomiting, diarrhea, constipation, dizziness, abdominal pain, skin rash, fevers, chills, night sweats, weight loss, swollen lymph nodes, body aches, joint swelling, chest pain, shortness of breath, mood changes. POSITIVE muscle aches  Objective  Blood pressure 126/80, pulse 88, height 5' 6 (1.676 m), weight 234 lb (106.1 kg), SpO2 97%, unknown if currently breastfeeding.   General: No apparent distress alert and oriented x3 mood and affect normal, dressed appropriately.  HEENT: Pupils equal,  extraocular movements intact  Respiratory: Patient's speak in full sentences and does not appear short of breath  Cardiovascular: No lower extremity edema, non tender, no erythema  Gait MSK:  Back does have some loss of lordosis.  Neck exam does have some limited sidebending.  Patient does have tightness noted on the left side of the neck.  Osteopathic findings  C2 flexed rotated and side bent left C6 flexed rotated and side bent left T3 extended rotated and side bent right inhaled rib T9 extended rotated and side bent left inhaled rib L3 flexed rotated and side bent right Sacrum right on right     Assessment and Plan:  Cervical disc disorder with radiculopathy of cervical region Chronic, with exacerbation noted today.  No change in medications.  Patient is continuing to be extremely active.  Discussed icing regimen and home exercises, discussed which activities to do and which ones to avoid.  Follow-up with me again in 6 to 8 weeks otherwise.  Increase activity slowly.  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  No medication changes.  Follow-up in 6 to 8 weeks    Nonallopathic problems  Decision today to treat with OMT was based on Physical Exam  After verbal consent patient was treated with HVLA, ME, FPR techniques in cervical, rib, thoracic, lumbar, and sacral  areas  Patient tolerated the procedure well with improvement in symptoms  Patient given exercises, stretches and lifestyle modifications  See medications in patient instructions if given  Patient will follow up in 4-8 weeks    The above documentation has been reviewed and is accurate and complete Soundra Lampley M Anis Degidio, DO          Note: This dictation was prepared with Dragon dictation along with smaller phrase technology. Any transcriptional errors that result from this process are unintentional.

## 2023-11-13 ENCOUNTER — Encounter: Payer: Self-pay | Admitting: Family Medicine

## 2023-11-13 ENCOUNTER — Ambulatory Visit: Admitting: Family Medicine

## 2023-11-13 VITALS — BP 126/80 | HR 88 | Ht 66.0 in | Wt 234.0 lb

## 2023-11-13 DIAGNOSIS — M9902 Segmental and somatic dysfunction of thoracic region: Secondary | ICD-10-CM

## 2023-11-13 DIAGNOSIS — M9908 Segmental and somatic dysfunction of rib cage: Secondary | ICD-10-CM

## 2023-11-13 DIAGNOSIS — M9903 Segmental and somatic dysfunction of lumbar region: Secondary | ICD-10-CM | POA: Diagnosis not present

## 2023-11-13 DIAGNOSIS — M9901 Segmental and somatic dysfunction of cervical region: Secondary | ICD-10-CM | POA: Diagnosis not present

## 2023-11-13 DIAGNOSIS — M9904 Segmental and somatic dysfunction of sacral region: Secondary | ICD-10-CM

## 2023-11-13 DIAGNOSIS — M5010 Cervical disc disorder with radiculopathy, unspecified cervical region: Secondary | ICD-10-CM

## 2023-11-13 NOTE — Assessment & Plan Note (Signed)
 Chronic, with exacerbation noted today.  No change in medications.  Patient is continuing to be extremely active.  Discussed icing regimen and home exercises, discussed which activities to do and which ones to avoid.  Follow-up with me again in 6 to 8 weeks otherwise.  Increase activity slowly.  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  No medication changes.  Follow-up in 6 to 8 weeks

## 2023-12-04 ENCOUNTER — Other Ambulatory Visit (HOSPITAL_COMMUNITY): Payer: Self-pay

## 2023-12-04 DIAGNOSIS — G4733 Obstructive sleep apnea (adult) (pediatric): Secondary | ICD-10-CM | POA: Diagnosis not present

## 2023-12-05 ENCOUNTER — Other Ambulatory Visit (HOSPITAL_COMMUNITY): Payer: Self-pay

## 2023-12-08 ENCOUNTER — Other Ambulatory Visit (HOSPITAL_COMMUNITY): Payer: Self-pay

## 2023-12-08 MED ORDER — SPIRONOLACTONE 50 MG PO TABS
50.0000 mg | ORAL_TABLET | Freq: Every day | ORAL | 0 refills | Status: DC
  Filled 2023-12-08: qty 30, 30d supply, fill #0

## 2023-12-09 ENCOUNTER — Other Ambulatory Visit (HOSPITAL_COMMUNITY): Payer: Self-pay

## 2023-12-16 NOTE — Progress Notes (Unsigned)
 [name] [address] [place] [address] Phone: [phone] Subjective:   [name], am serving as a scribe for Dr. Arthea Claudene.  I'm seeing this patient by the request  of:  Seabron Lenis, MD  CC: Neck pain arm pain  YEP:Dlagzrupcz  Katie Glass is a 48 y.o. female coming in with complaint MSK helped, but didn't much. When she tries to grip hand will go numb. L side is more bothersome. Going from neck down into hand.       Past Medical History:  Diagnosis Date   Arthritis    knees   Asthma    as a child   Back pain    Food allergy    Tannin/sulfate in Wine and ciders, rash on face and hands   Joint pain    Lower extremity edema    MMT (medial meniscus tear)    right   Sleep apnea    CPAP nightly   Umbilical hernia    Past Surgical History:  Procedure Laterality Date   BILATERAL SALPINGECTOMY Bilateral 04/15/2018   Procedure: BILATERAL SALPINGECTOMY;  Surgeon: Sarrah Browning, MD;  Location: [facility]ING [facility];  Service: Obstetrics;  Laterality: Bilateral;   CESAREAN SECTION  11/12/2010   Procedure: CESAREAN SECTION;  Surgeon: Marjorie HILARIO Gull;  Location: [facility] OR[facility];  Service: Gynecology;  Laterality: N/A;   CESAREAN SECTION N/A 04/15/2018   Procedure: CESAREAN SECTION;  Surgeon: Sarrah Browning, MD;  Location: [facility] BIRTHING SUITES;  Service: Obstetrics;  Laterality: N/A;   KNEE ARTHROSCOPY Right 02/11/2017   Procedure: RIGHT KNEE ARTHROSCOPY WITH DEBRIDEMENT CHONDROMALACIA;  Surgeon: Liam Lerner, MD;  Location: [facility];  Service: Orthopedics;  Laterality: Right;   Social History   Socioeconomic History   Marital status: Married    Spouse name: Lennix Rotundo   Number of children: Not on file   Years of education: Not on file   Highest education level: Not on file  Occupational History   Occupation: Pastor  Tobacco Use   Smoking status: Never   Smokeless tobacco: Never  Vaping Use   Vaping  status: Never Used  Substance and Sexual Activity   Alcohol use: Yes    Comment: social   Drug use: No   Sexual activity: Yes    Birth control/protection: None  Other Topics Concern   Not on file  Social History Narrative   Not on file   Social Drivers of Health   Financial Resource Strain: Low Risk  (04/06/2018)   Overall Financial Resource Strain (CARDIA)    Difficulty of Paying Living Expenses: Not hard at all  Food Insecurity: No Food Insecurity (04/06/2018)   Hunger Vital Sign    Worried About Running Out of Food in the Last Year: Never true    Ran Out of Food in the Last Year: Never true  Transportation Needs: Unknown (04/06/2018)   PRAPARE - Administrator, Civil Service (Medical): No    Lack of Transportation (Non-Medical): Not on file  Physical Activity: Not on file  Stress: Stress Concern Present (04/06/2018)   [employer] of Occupational Health - Occupational Stress Questionnaire    Feeling of Stress : Very much  Social Connections: Not on file   No Known Allergies Family History  Problem Relation Age of Onset   Hypertension Father    Hyperlipidemia Father    Heart disease Father    Cancer Maternal Grandfather    Heart disease Paternal Grandfather  Pulmonary embolism Mother    Alcohol abuse Mother     Current Outpatient Medications (Endocrine & Metabolic):    levonorgestrel  (MIRENA ) 20 MCG/24HR IUD, 1 each by Intrauterine route once.   methylPREDNISolone  (MEDROL ) 4 MG TBPK tablet, Take as directed  Current Outpatient Medications (Cardiovascular):    nitroGLYCERIN  (NITRO-DUR ) 0.2 mg/hr patch, Apply 1/4 of a patch to skin once daily.   spironolactone  (ALDACTONE ) 50 MG tablet, Take 1 tablet (50 mg total) by mouth daily.  Current Outpatient Medications (Respiratory):    albuterol  (VENTOLIN  HFA) 108 (90 Base) MCG/ACT inhaler, Inhale 1 puff into the lungs every 4 (four) hours as needed.   cetirizine (ZYRTEC) 10 MG tablet, Take 10 mg by mouth  daily.  Current Outpatient Medications (Analgesics):    meloxicam  (MOBIC ) 15 MG tablet, TAKE 1 TABLET(15 MG) BY MOUTH DAILY   Current Outpatient Medications (Other):    lisdexamfetamine (VYVANSE ) 60 MG capsule, Take 1 capsule (60 mg total) by mouth daily.   lisdexamfetamine (VYVANSE ) 70 MG capsule, Take 1 capsule (70 mg total) by mouth daily.   lisdexamfetamine (VYVANSE ) 70 MG capsule, Take 1 capsule (70 mg total) by mouth in the morning   lisdexamfetamine (VYVANSE ) 70 MG capsule, Take 1 capsule (70 mg total) by mouth in the morning.   lisdexamfetamine (VYVANSE ) 70 MG capsule, Take 1 capsule (70 mg total) by mouth in the morning ** DNF 11/17/2023**   Lisdexamfetamine Dimesylate  (VYVANSE ) 60 MG CHEW, Chew 60 mg by mouth every morning.   metoCLOPramide  (REGLAN ) 5 MG tablet, Take 1 tablet by mouth before meals twice a day   Multiple Vitamins-Minerals (WOMENS MULTI VITAMIN & MINERAL PO), Take 1 tablet by mouth daily.   nystatin  ointment (MYCOSTATIN ), Apply topically 2 (two) times daily.   nystatin -triamcinolone  (MYCOLOG II) cream, Apply topically 2 times a day, as needed   tirzepatide  (ZEPBOUND ) 15 MG/0.5ML Pen, Inject 15 mg into the skin once a week.   tirzepatide  (ZEPBOUND ) 15 MG/0.5ML Pen, Inject 15 mg into the skin once a week.   venlafaxine  (EFFEXOR ) 75 MG tablet, Take 1 tablet (75 mg total) by mouth daily.   venlafaxine  XR (EFFEXOR  XR) 37.5 MG 24 hr capsule, Take 1 capsule (37.5 mg total) by mouth daily with breakfast.   Vitamin D , Ergocalciferol , (DRISDOL ) 1.25 MG (50000 UNIT) CAPS capsule, Take 1 capsule (50,000 Units total) by mouth every 7 (seven) days.   Vitamin D , Ergocalciferol , (DRISDOL ) 1.25 MG (50000 UNIT) CAPS capsule, Take 1 capsule (50,000 Units total) by mouth once a week.   Reviewed prior external information including notes and imaging from  primary care provider As well as notes that were available from care everywhere and other healthcare systems.  Past medical  history, social, surgical and family history all reviewed in electronic medical record.  No pertanent information unless stated regarding to the chief complaint.   Review of Systems:  No headache, visual changes, nausea, vomiting, diarrhea, constipation, dizziness, abdominal pain, skin rash, fevers, chills, night sweats, weight loss, swollen lymph nodes, body aches, joint swelling, chest pain, shortness of breath, mood changes. POSITIVE muscle aches  Objective  Blood pressure 112/84, pulse 85, height 5' 6 (1.676 m), weight 236 lb (107 kg), SpO2 99%, unknown if currently breastfeeding.   General: No apparent distress alert and oriented x3 mood and affect normal, dressed appropriately.  HEENT: Pupils equal, extraocular movements intact  Respiratory: Patient's speak in full sentences and does not appear short of breath  Cardiovascular: No lower extremity edema, non tender, no erythema  Neck exam does have some mild loss lordosis.  Patient noted has more limitation with right sided sidebending.  Negative Spurling's noted.  Left wrist does show severe tenderness to Tinel's noted.  Good grip strength still noted though.  Procedure: Real-time Ultrasound Guided Injection of left carpal tunnel Device: GE Logiq Q7 Ultrasound guided injection is preferred based studies that show increased duration, increased effect, greater accuracy, decreased procedural pain, increased response rate with ultrasound guided versus blind injection.  Verbal informed consent obtained.  Time-out conducted.  Noted no overlying erythema, induration, or other signs of local infection.  Skin prepped in a sterile fashion.  Local anesthesia: Topical Ethyl chloride.  With sterile technique and under real time ultrasound guidance:  median nerve visualized.  23g 5/8 inch needle inserted distal to proximal approach into nerve sheath. Pictures taken for needle placement. Patient did have injection of 0.5 cc of 0.5% Marcaine , and 1 cc of  Kenalog  40 mg/dL. Completed without difficulty  Pain immediately resolved suggesting accurate placement of the medication.  Advised to call if fevers/chills, erythema, induration, drainage, or persistent bleeding.  Images permanently stored  Impression: Technically successful ultrasound guided injection.    Impression and Recommendations:    The above documentation has been reviewed and is accurate and complete Katie Warehime M Tabius Rood, DO

## 2023-12-17 ENCOUNTER — Other Ambulatory Visit: Payer: Self-pay

## 2023-12-17 ENCOUNTER — Ambulatory Visit: Admitting: Family Medicine

## 2023-12-17 ENCOUNTER — Encounter: Payer: Self-pay | Admitting: Family Medicine

## 2023-12-17 VITALS — BP 112/84 | HR 85 | Ht 66.0 in | Wt 236.0 lb

## 2023-12-17 DIAGNOSIS — G5602 Carpal tunnel syndrome, left upper limb: Secondary | ICD-10-CM | POA: Diagnosis not present

## 2023-12-17 NOTE — Patient Instructions (Addendum)
 Injection in L wrist Good to see you! See you again in 2 weeks for R side

## 2023-12-17 NOTE — Assessment & Plan Note (Signed)
 Patient is given nearly a year out of every injection.  Hopeful for this type of improvement again.  Still wants to hold on any surgical intervention discussed icing regimen.  Follow-up again in 6 to 8 weeks can repeat on the contralateral side that is also starting to have some symptoms.  Differential includes lumbar radiculopathy but think it is unlikely.

## 2023-12-29 NOTE — Progress Notes (Unsigned)
 [name] [address] [place] [address] Phone: [phone] Subjective:   Katie Glass, am serving as a scribe for Dr. Arthea Claudene.  I'm seeing this patient by the request  of:  Seabron Lenis, MD  CC: Right wrist pain  YEP:Dlagzrupcz  12/17/2023 Patient is given nearly a year out of every injection.  Hopeful for this type of improvement again.  Still wants to hold on any surgical intervention discussed icing regimen.  Follow-up again in 6 to 8 weeks can repeat on the contralateral side that is also starting to have some symptoms.  Differential includes lumbar radiculopathy but think it is unlikely.      Update 12/31/2023 Katie Glass is a 48 y.o. female coming in with complaint of B wrist pain. Here for R wrist injection. Patient states doing well. Here for R wrist injection.     Past Medical History:  Diagnosis Date   Arthritis    knees   Asthma    as a child   Back pain    Food allergy    Tannin/sulfate in Wine and ciders, rash on face and hands   Joint pain    Lower extremity edema    MMT (medial meniscus tear)    right   Sleep apnea    CPAP nightly   Umbilical hernia    Past Surgical History:  Procedure Laterality Date   BILATERAL SALPINGECTOMY Bilateral 04/15/2018   Procedure: BILATERAL SALPINGECTOMY;  Surgeon: Sarrah Browning, MD;  Location: [facility] BIRTHING SUITES;  Service: Obstetrics;  Laterality: Bilateral;   CESAREAN SECTION  11/12/2010   Procedure: CESAREAN SECTION;  Surgeon: Marjorie HILARIO Gull;  Location: [facility];  Service: Gynecology;  Laterality: N/A;   CESAREAN SECTION N/A 04/15/2018   Procedure: CESAREAN SECTION;  Surgeon: Sarrah Browning, MD;  Location: [facility] BIRTHING SUITES;  Service: Obstetrics;  Laterality: N/A;   KNEE ARTHROSCOPY Right 02/11/2017   Procedure: RIGHT KNEE ARTHROSCOPY WITH DEBRIDEMENT CHONDROMALACIA;  Surgeon: Liam Lerner, MD;  Location: [facility];  Service: Orthopedics;   Laterality: Right;   Social History   Socioeconomic History   Marital status: Married    Spouse name: Merridy Pascoe   Number of children: Not on file   Years of education: Not on file   Highest education level: Not on file  Occupational History   Occupation: Pastor  Tobacco Use   Smoking status: Never   Smokeless tobacco: Never  Vaping Use   Vaping status: Never Used  Substance and Sexual Activity   Alcohol use: Yes    Comment: social   Drug use: No   Sexual activity: Yes    Birth control/protection: None  Other Topics Concern   Not on file  Social History Narrative   Not on file   Social Drivers of Health   Financial Resource Strain: Low Risk  (04/06/2018)   Overall Financial Resource Strain (CARDIA)    Difficulty of Paying Living Expenses: Not hard at all  Food Insecurity: No Food Insecurity (04/06/2018)   Hunger Vital Sign    Worried About Running Out of Food in the Last Year: Never true    Ran Out of Food in the Last Year: Never true  Transportation Needs: Unknown (04/06/2018)   PRAPARE - Administrator, Civil Service (Medical): No    Lack of Transportation (Non-Medical): Not on file  Physical Activity: Not on file  Stress: Stress Concern Present (04/06/2018)   [employer] of Occupational Health -  Occupational Stress Questionnaire    Feeling of Stress : Very much  Social Connections: Not on file   No Known Allergies Family History  Problem Relation Age of Onset   Hypertension Father    Hyperlipidemia Father    Heart disease Father    Cancer Maternal Grandfather    Heart disease Paternal Grandfather    Pulmonary embolism Mother    Alcohol abuse Mother     Current Outpatient Medications (Endocrine & Metabolic):    levonorgestrel  (MIRENA ) 20 MCG/24HR IUD, 1 each by Intrauterine route once.   methylPREDNISolone  (MEDROL ) 4 MG TBPK tablet, Take as directed  Current Outpatient Medications (Cardiovascular):    nitroGLYCERIN  (NITRO-DUR ) 0.2  mg/hr patch, Apply 1/4 of a patch to skin once daily.   spironolactone  (ALDACTONE ) 50 MG tablet, Take 1 tablet (50 mg total) by mouth daily.  Current Outpatient Medications (Respiratory):    albuterol  (VENTOLIN  HFA) 108 (90 Base) MCG/ACT inhaler, Inhale 1 puff into the lungs every 4 (four) hours as needed.   cetirizine (ZYRTEC) 10 MG tablet, Take 10 mg by mouth daily.  Current Outpatient Medications (Analgesics):    meloxicam  (MOBIC ) 15 MG tablet, TAKE 1 TABLET(15 MG) BY MOUTH DAILY   Current Outpatient Medications (Other):    lisdexamfetamine (VYVANSE ) 60 MG capsule, Take 1 capsule (60 mg total) by mouth daily.   lisdexamfetamine (VYVANSE ) 70 MG capsule, Take 1 capsule (70 mg total) by mouth daily.   lisdexamfetamine (VYVANSE ) 70 MG capsule, Take 1 capsule (70 mg total) by mouth in the morning   lisdexamfetamine (VYVANSE ) 70 MG capsule, Take 1 capsule (70 mg total) by mouth in the morning.   lisdexamfetamine (VYVANSE ) 70 MG capsule, Take 1 capsule (70 mg total) by mouth in the morning ** DNF 11/17/2023**   Lisdexamfetamine Dimesylate  (VYVANSE ) 60 MG CHEW, Chew 60 mg by mouth every morning.   metoCLOPramide  (REGLAN ) 5 MG tablet, Take 1 tablet by mouth before meals twice a day   Multiple Vitamins-Minerals (WOMENS MULTI VITAMIN & MINERAL PO), Take 1 tablet by mouth daily.   nystatin  ointment (MYCOSTATIN ), Apply topically 2 (two) times daily.   nystatin -triamcinolone  (MYCOLOG II) cream, Apply topically 2 times a day, as needed   tirzepatide  (ZEPBOUND ) 15 MG/0.5ML Pen, Inject 15 mg into the skin once a week.   tirzepatide  (ZEPBOUND ) 15 MG/0.5ML Pen, Inject 15 mg into the skin once a week.   venlafaxine  (EFFEXOR ) 75 MG tablet, Take 1 tablet (75 mg total) by mouth daily.   venlafaxine  XR (EFFEXOR  XR) 37.5 MG 24 hr capsule, Take 1 capsule (37.5 mg total) by mouth daily with breakfast.   Vitamin D , Ergocalciferol , (DRISDOL ) 1.25 MG (50000 UNIT) CAPS capsule, Take 1 capsule (50,000 Units total) by  mouth every 7 (seven) days.   Vitamin D , Ergocalciferol , (DRISDOL ) 1.25 MG (50000 UNIT) CAPS capsule, Take 1 capsule (50,000 Units total) by mouth once a week.   Reviewed prior external information including notes and imaging from  primary care provider As well as notes that were available from care everywhere and other healthcare systems.  Past medical history, social, surgical and family history all reviewed in electronic medical record.  No pertanent information unless stated regarding to the chief complaint.   Review of Systems:  No headache, visual changes, nausea, vomiting, diarrhea, constipation, dizziness, abdominal pain, skin rash, fevers, chills, night sweats, weight loss, swollen lymph nodes, body aches, joint swelling, chest pain, shortness of breath, mood changes. POSITIVE muscle aches  Objective  Height 5' 6 (1.676 m), unknown if  currently breastfeeding.   General: No apparent distress alert and oriented x3 mood and affect normal, dressed appropriately.  HEENT: Pupils equal, extraocular movements intact  Respiratory: Patient's speak in full sentences and does not appear short of breath  Cardiovascular: No lower extremity edema, non tender, no erythema  Procedure: Real-time Ultrasound Guided Injection of right median nerve sheath Device: GE Logiq Q7 Ultrasound guided injection is preferred based studies that show increased duration, increased effect, greater accuracy, decreased procedural pain, increased response rate, and decreased cost with ultrasound guided versus blind injection.  Verbal informed consent obtained.  Time-out conducted.  Noted no overlying erythema, induration, or other signs of local infection.  Skin prepped in a sterile fashion.  Local anesthesia: Topical Ethyl chloride.  With sterile technique and under real time ultrasound guidance: With a 25-gauge half inch needle injected with 0.5 cc of 0.5% Marcaine  and 0.5 cc of Kenalog  40 mg/mL Completed without  difficulty  Pain immediately resolved suggesting accurate placement of the medication.  Advised to call if fevers/chills, erythema, induration, drainage, or persistent bleeding.  Images saved Impression: Technically successful ultrasound guided injection.   Impression and Recommendations:      The above documentation has been reviewed and is accurate and complete Joden Bonsall M Ryian Lynde, DO

## 2023-12-30 DIAGNOSIS — G4733 Obstructive sleep apnea (adult) (pediatric): Secondary | ICD-10-CM | POA: Diagnosis not present

## 2023-12-31 ENCOUNTER — Ambulatory Visit: Admitting: Family Medicine

## 2023-12-31 ENCOUNTER — Other Ambulatory Visit: Payer: Self-pay

## 2023-12-31 VITALS — Ht 66.0 in

## 2023-12-31 DIAGNOSIS — G5602 Carpal tunnel syndrome, left upper limb: Secondary | ICD-10-CM

## 2023-12-31 DIAGNOSIS — G5603 Carpal tunnel syndrome, bilateral upper limbs: Secondary | ICD-10-CM | POA: Diagnosis not present

## 2023-12-31 DIAGNOSIS — G5601 Carpal tunnel syndrome, right upper limb: Secondary | ICD-10-CM

## 2023-12-31 NOTE — Assessment & Plan Note (Signed)
 Repeat injection given again today, tolerated the procedure well, nearly 1 year since we have done injection on this side.  Hopeful that this will make another significant improvement.  Follow-up with me again in 6 to 8 weeks for other problems.

## 2023-12-31 NOTE — Assessment & Plan Note (Signed)
 Improved after last injection

## 2023-12-31 NOTE — Patient Instructions (Addendum)
 R wrist injection Good to see you! See you again in 8 weeks

## 2024-01-05 ENCOUNTER — Other Ambulatory Visit (HOSPITAL_COMMUNITY): Payer: Self-pay

## 2024-01-05 MED ORDER — SPIRONOLACTONE 50 MG PO TABS
50.0000 mg | ORAL_TABLET | Freq: Every day | ORAL | 0 refills | Status: DC
  Filled 2024-01-05: qty 30, 30d supply, fill #0

## 2024-01-07 ENCOUNTER — Other Ambulatory Visit (HOSPITAL_COMMUNITY): Payer: Self-pay

## 2024-01-11 ENCOUNTER — Other Ambulatory Visit (HOSPITAL_COMMUNITY): Payer: Self-pay

## 2024-01-11 DIAGNOSIS — F4322 Adjustment disorder with anxiety: Secondary | ICD-10-CM | POA: Diagnosis not present

## 2024-01-11 MED ORDER — LISDEXAMFETAMINE DIMESYLATE 70 MG PO CAPS
70.0000 mg | ORAL_CAPSULE | Freq: Every morning | ORAL | 0 refills | Status: DC
  Filled 2024-01-11: qty 30, 30d supply, fill #0

## 2024-01-12 ENCOUNTER — Other Ambulatory Visit (HOSPITAL_COMMUNITY): Payer: Self-pay

## 2024-01-21 ENCOUNTER — Other Ambulatory Visit (HOSPITAL_COMMUNITY): Payer: Self-pay

## 2024-02-11 ENCOUNTER — Other Ambulatory Visit (HOSPITAL_COMMUNITY): Payer: Self-pay

## 2024-02-11 ENCOUNTER — Ambulatory Visit: Admitting: Family Medicine

## 2024-02-11 ENCOUNTER — Telehealth: Payer: Self-pay | Admitting: Family Medicine

## 2024-02-11 ENCOUNTER — Encounter: Payer: Self-pay | Admitting: Family Medicine

## 2024-02-11 VITALS — BP 126/82 | HR 91 | Temp 98.4°F | Ht 66.0 in | Wt 229.2 lb

## 2024-02-11 DIAGNOSIS — R632 Polyphagia: Secondary | ICD-10-CM

## 2024-02-11 DIAGNOSIS — N951 Menopausal and female climacteric states: Secondary | ICD-10-CM | POA: Diagnosis not present

## 2024-02-11 DIAGNOSIS — R600 Localized edema: Secondary | ICD-10-CM

## 2024-02-11 DIAGNOSIS — Z6836 Body mass index (BMI) 36.0-36.9, adult: Secondary | ICD-10-CM

## 2024-02-11 DIAGNOSIS — E559 Vitamin D deficiency, unspecified: Secondary | ICD-10-CM

## 2024-02-11 DIAGNOSIS — F902 Attention-deficit hyperactivity disorder, combined type: Secondary | ICD-10-CM

## 2024-02-11 DIAGNOSIS — N958 Other specified menopausal and perimenopausal disorders: Secondary | ICD-10-CM

## 2024-02-11 DIAGNOSIS — G4733 Obstructive sleep apnea (adult) (pediatric): Secondary | ICD-10-CM | POA: Diagnosis not present

## 2024-02-11 DIAGNOSIS — F50811 Binge eating disorder, moderate: Secondary | ICD-10-CM

## 2024-02-11 MED ORDER — SPIRONOLACTONE 50 MG PO TABS
50.0000 mg | ORAL_TABLET | Freq: Every day | ORAL | 0 refills | Status: AC
  Filled 2024-02-11: qty 90, 90d supply, fill #0

## 2024-02-11 MED ORDER — ZEPBOUND 15 MG/0.5ML ~~LOC~~ SOAJ
15.0000 mg | SUBCUTANEOUS | 3 refills | Status: DC
  Filled 2024-02-11: qty 6, 84d supply, fill #0

## 2024-02-11 MED ORDER — LISDEXAMFETAMINE DIMESYLATE 70 MG PO CAPS
70.0000 mg | ORAL_CAPSULE | Freq: Every morning | ORAL | 0 refills | Status: DC
  Filled 2024-02-11: qty 30, 30d supply, fill #0

## 2024-02-11 MED ORDER — VITAMIN D (ERGOCALCIFEROL) 1.25 MG (50000 UNIT) PO CAPS
50000.0000 [IU] | ORAL_CAPSULE | ORAL | 0 refills | Status: DC
  Filled 2024-02-11: qty 12, 84d supply, fill #0

## 2024-02-11 NOTE — Telephone Encounter (Signed)
o

## 2024-02-11 NOTE — Progress Notes (Signed)
 Assessment & Plan:   1. OSA on CPAP, Rx Zepbound  (Primary) Doing well. Refill Zepbound  today. Will continue weight management efforts. - tirzepatide  (ZEPBOUND ) 15 MG/0.5ML Pen; Inject 15 mg into the skin once a week.  Dispense: 6 mL; Refill: 3  2. Perimenopausal symptoms Worsening. Symptoms include hot flashes, insomnia, GSM, mood changes, insulin  resistance, brain fog, and decreased libido. Mammogram: 09/19/22. IUD 01/23/20 by Dr. Okey (uterine protection). Pap 06/14/20. - estradiol  (CLIMARA  - DOSED IN MG/24 HR) 0.025 mg/24hr patch; Place 1 patch (0.025 mg total) onto the skin once a week.  Dispense: 4 patch; Refill: 12  3. Genitourinary syndrome of menopause New. Risk versus benefits of medication reviewed. The patient understands monitoring parameters and red flags.  - conjugated estrogens  (PREMARIN ) vaginal cream; Place 1 Applicatorful vaginally daily.  Dispense: 42.5 g; Refill: 12  4. Vitamin D  deficiency Refill today. Labs at next visit.  - Vitamin D , Ergocalciferol , (DRISDOL ) 1.25 MG (50000 UNIT) CAPS capsule; Take 1 capsule (50,000 Units total) by mouth once a week.  Dispense: 12 capsule; Refill: 0  5. Attention deficit hyperactivity disorder (ADHD), combined type Controlled with current treatment. Will work on lifestyle changes to maximize. - lisdexamfetamine (VYVANSE ) 70 MG capsule; Take 1 capsule (70 mg total) by mouth in the morning.  Dispense: 30 capsule; Refill: 0  6. Binge eating disorder, moderate As above. - lisdexamfetamine (VYVANSE ) 70 MG capsule; Take 1 capsule (70 mg total) by mouth in the morning.  Dispense: 30 capsule; Refill: 0  7. Bilateral lower extremity edema Controlled.  - spironolactone  (ALDACTONE ) 50 MG tablet; Take 1 tablet (50 mg total) by mouth daily.  Dispense: 90 tablet; Refill: 0  8. Class 2 severe obesity with serious comorbidity and body mass index (BMI) of 36.0 to 36.9 in adult, unspecified obesity type Will continue to eliminate barriers to  success. Labs next visit.   Meds ordered this encounter  Medications   lisdexamfetamine (VYVANSE ) 70 MG capsule    Sig: Take 1 capsule (70 mg total) by mouth in the morning.    Dispense:  30 capsule    Refill:  0   spironolactone  (ALDACTONE ) 50 MG tablet    Sig: Take 1 tablet (50 mg total) by mouth daily.    Dispense:  90 tablet    Refill:  0   Vitamin D , Ergocalciferol , (DRISDOL ) 1.25 MG (50000 UNIT) CAPS capsule    Sig: Take 1 capsule (50,000 Units total) by mouth once a week.    Dispense:  12 capsule    Refill:  0   tirzepatide  (ZEPBOUND ) 15 MG/0.5ML Pen    Sig: Inject 15 mg into the skin once a week.    Dispense:  6 mL    Refill:  3   estradiol  (CLIMARA  - DOSED IN MG/24 HR) 0.025 mg/24hr patch    Sig: Place 1 patch (0.025 mg total) onto the skin once a week.    Dispense:  4 patch    Refill:  12   conjugated estrogens  (PREMARIN ) vaginal cream    Sig: Place 1 Applicatorful vaginally daily.    Dispense:  42.5 g    Refill:  12   Geni Shutter, DO, MS, FAAFP, Dipl. [facility] [address] [place], [address] Dept: [phone] Dept Fax: [phone]   Subjective:   Patient is well known to me from my previous clinic and is now establishing care with me as their primary care provider.  All past medical history, surgical history,  allergies, family history, immunizations andmedications were updated in the EMR today and reviewed under the history and medication portions of their EMR. Review of Systems: Negative, with the exception of above mentioned in HPI.    Objective:    VITALS: BP 126/82 (BP Location: Right Arm, Cuff Size: Normal)   Pulse 91   Temp 98.4 F (36.9 C) (Oral)   Ht 5' 6 (1.676 m)   Wt 229 lb 3.2 oz (104 kg)   SpO2 97%   BMI 36.99 kg/m   Wt Readings from Last 3 Encounters:  02/11/24 229 lb 3.2 oz (104 kg)  12/17/23 236 lb (107 kg)  11/13/23 234 lb (106.1 kg)   PHYSICAL EXAM: General: The patient  is a well-nourished female, in no acute distress. The vital signs are documented above. Cardiac: There is a regular rate and rhythm.  Pulmonary: There is good air exchange bilaterally without wheezing or rales. Psychiatric: The patient has a normal affect.  LABS: Lab Results  Component Value Date   CREATININE 0.94 06/28/2020   BUN 16 06/28/2020   NA 139 06/28/2020   K 4.3 06/28/2020   CL 103 06/28/2020   CO2 21 06/28/2020   Lab Results  Component Value Date   ALT 11 06/28/2020   AST 14 06/28/2020   ALKPHOS 44 06/28/2020   BILITOT 0.6 06/28/2020   Lab Results  Component Value Date   HGBA1C 5.4 06/28/2020   HGBA1C 5.2 09/21/2019   HGBA1C 5.4 05/17/2019   Lab Results  Component Value Date   INSULIN  4.0 06/28/2020   INSULIN  7.6 09/21/2019   INSULIN  13.2 05/17/2019   Lab Results  Component Value Date   TSH 1.350 05/17/2019   Lab Results  Component Value Date   CHOL 155 06/28/2020   HDL 62 06/28/2020   LDLCALC 81 06/28/2020   TRIG 57 06/28/2020   CHOLHDL 3.3 05/17/2019   Lab Results  Component Value Date   VD25OH 40.6 06/28/2020   VD25OH 25.0 (L) 09/21/2019   VD25OH 22.9 (L) 05/17/2019   Lab Results  Component Value Date   WBC 6.7 06/28/2020   HGB 14.9 06/28/2020   HCT 44.2 06/28/2020   MCV 93 06/28/2020   PLT 209 06/28/2020   Lab Results  Component Value Date   IRON 93 09/21/2019   TIBC 299 09/21/2019   FERRITIN 51 09/21/2019

## 2024-02-12 ENCOUNTER — Other Ambulatory Visit (HOSPITAL_COMMUNITY): Payer: Self-pay

## 2024-02-13 ENCOUNTER — Other Ambulatory Visit (HOSPITAL_COMMUNITY): Payer: Self-pay

## 2024-02-14 ENCOUNTER — Encounter: Payer: Self-pay | Admitting: Family Medicine

## 2024-02-14 DIAGNOSIS — F902 Attention-deficit hyperactivity disorder, combined type: Secondary | ICD-10-CM | POA: Insufficient documentation

## 2024-02-14 DIAGNOSIS — Z6836 Body mass index (BMI) 36.0-36.9, adult: Secondary | ICD-10-CM | POA: Insufficient documentation

## 2024-02-14 DIAGNOSIS — N951 Menopausal and female climacteric states: Secondary | ICD-10-CM | POA: Insufficient documentation

## 2024-02-14 DIAGNOSIS — N958 Other specified menopausal and perimenopausal disorders: Secondary | ICD-10-CM | POA: Insufficient documentation

## 2024-02-14 MED ORDER — ESTRADIOL 0.025 MG/24HR TD PTWK
0.0250 mg | MEDICATED_PATCH | TRANSDERMAL | 12 refills | Status: AC
  Filled 2024-02-14: qty 4, 28d supply, fill #0
  Filled 2024-03-15: qty 12, 84d supply, fill #1

## 2024-02-14 MED ORDER — ESTROGENS CONJUGATED 0.625 MG/GM VA CREA
1.0000 | TOPICAL_CREAM | Freq: Every day | VAGINAL | 12 refills | Status: AC
  Filled 2024-02-14: qty 60, 30d supply, fill #0

## 2024-02-15 ENCOUNTER — Other Ambulatory Visit (HOSPITAL_COMMUNITY): Payer: Self-pay

## 2024-02-15 ENCOUNTER — Other Ambulatory Visit: Payer: Self-pay

## 2024-02-22 NOTE — Progress Notes (Unsigned)
 [name] [address] [place] [address] Phone: [phone] Subjective:   [name], am serving as a scribe for Dr. Arthea Claudene.  I'm seeing this patient by the request  of:  Prentiss Frieze, DO  CC: Bilateral wrist pain, back pain  YEP:Dlagzrupcz  12/31/2023 Improved after last injection   Repeat injection given again today, tolerated the procedure well, nearly 1 year since we have done injection on this side.  Hopeful that this will make another significant improvement.  Follow-up with me again in 6 to 8 weeks for other problems.     Update 02/23/2024 Katie Glass is a 48 y.o. female coming in with complaint of B wrist pain. Patient states that her wrist are doing well. L flared up last week.   R sided lumbar spine pain. Pain radiating down the lateral aspect of R leg and down the front of lower leg. Had acupuncture this morning. Last seek her pain was unbearable. Pain does improve with movement as does using massage gun. Hx of lumbar disc issue that caused symptoms on L side of body.        Past Medical History:  Diagnosis Date   Allergy    Seasonal   Arthritis    knees   Asthma    as a child   Back pain    Food allergy    Tannin/sulfate in Wine and ciders, rash on face and hands   Joint pain    Lower extremity edema    Menorrhagia with regular cycle 04/02/2020   MMT (medial meniscus tear)    right   Sleep apnea    CPAP nightly   Umbilical hernia    Past Surgical History:  Procedure Laterality Date   BILATERAL SALPINGECTOMY Bilateral 04/15/2018   Procedure: BILATERAL SALPINGECTOMY;  Surgeon: Sarrah Browning, MD;  Location: [facility] BIRTHING SUIT[facility];  Service: Obstetrics;  Laterality: Bilateral;   CESAREAN SECTION  11/12/2010   Procedure: CESAREAN SECTION;  Surgeon: Marjorie HILARIO Gull;  Location: [facility];  Service: Gynecology;  Laterality: N/A;   CESAREAN SECTION N/A 04/15/2018   Procedure: CESAREAN SECTION;  Surgeon:  Sarrah Browning, MD;  Location: [facility] BIRTHING SUITES;  Service: Obstetrics;  Laterality: N/A;   KNEE ARTHROSCOPY Right 02/11/2017   Procedure: RIGHT KNEE ARTHROSCOPY WITH DEBRIDEMENT CHONDROMALACIA;  Surgeon: Liam Lerner, MD;  Location: [facility];  Service: Orthopedics;  Laterality: Right;   Social History   Socioeconomic History   Marital status: Married    Spouse name: Georgena Weisheit   Number of children: Not on file   Years of education: Not on file   Highest education level: Doctorate  Occupational History   Occupation: Education Officer, Environmental  Tobacco Use   Smoking status: Never   Smokeless tobacco: Never  Vaping Use   Vaping status: Never Used  Substance and Sexual Activity   Alcohol use: Yes    Comment: social   Drug use: No   Sexual activity: Yes    Birth control/protection: None  Other Topics Concern   Not on file  Social History Narrative   Not on file   Social Drivers of Health   Financial Resource Strain: Low Risk  (02/10/2024)   Overall Financial Resource Strain (CARDIA)    Difficulty of Paying Living Expenses: Not very hard  Food Insecurity: No Food Insecurity (02/10/2024)   Hunger Vital Sign    Worried About Running Out of Food in the Last Year: Never true    Ran  Out of Food in the Last Year: Never true  Transportation Needs: No Transportation Needs (02/10/2024)   PRAPARE - Administrator, Civil Service (Medical): No    Lack of Transportation (Non-Medical): No  Physical Activity: Sufficiently Active (02/10/2024)   Exercise Vital Sign    Days of Exercise per Week: 6 days    Minutes of Exercise per Session: 30 min  Stress: Stress Concern Present (02/10/2024)   [employer] of Occupational Health - Occupational Stress Questionnaire    Feeling of Stress: To some extent  Social Connections: Socially Integrated (02/10/2024)   Social Connection and Isolation Panel    Frequency of Communication with Friends and Family: More than three times  a week    Frequency of Social Gatherings with Friends and Family: More than three times a week    Attends Religious Services: More than 4 times per year    Active Member of [employer] or Organizations: Yes    Attends Engineer, Structural: More than 4 times per year    Marital Status: Married   No Known Allergies Family History  Problem Relation Age of Onset   Hypertension Father    Hyperlipidemia Father    Heart disease Father    Cancer Maternal Grandfather    Heart disease Paternal Grandfather    Pulmonary embolism Mother    Alcohol abuse Mother     Current Outpatient Medications (Endocrine & Metabolic):    estradiol  (CLIMARA  - DOSED IN MG/24 HR) 0.025 mg/24hr patch, Place 1 patch (0.025 mg total) onto the skin once a week.   levonorgestrel  (MIRENA ) 20 MCG/24HR IUD, 1 each by Intrauterine route once.   predniSONE  (DELTASONE ) 20 MG tablet, Take 2 tablets (40 mg total) by mouth daily with breakfast.  Current Outpatient Medications (Cardiovascular):    spironolactone  (ALDACTONE ) 50 MG tablet, Take 1 tablet (50 mg total) by mouth daily.  Current Outpatient Medications (Respiratory):    albuterol  (VENTOLIN  HFA) 108 (90 Base) MCG/ACT inhaler, Inhale 1 puff into the lungs every 4 (four) hours as needed.   cetirizine (ZYRTEC) 10 MG tablet, Take 10 mg by mouth daily.  Current Outpatient Medications (Analgesics):    meloxicam  (MOBIC ) 15 MG tablet, TAKE 1 TABLET(15 MG) BY MOUTH DAILY   Current Outpatient Medications (Other):    conjugated estrogens  (PREMARIN ) vaginal cream, Place 1 Applicatorful vaginally daily.   lisdexamfetamine (VYVANSE ) 70 MG capsule, Take 1 capsule (70 mg total) by mouth in the morning.   Multiple Vitamins-Minerals (WOMENS MULTI VITAMIN & MINERAL PO), Take 1 tablet by mouth daily.   nystatin -triamcinolone  (MYCOLOG II) cream, Apply topically 2 times a day, as needed   tirzepatide  (ZEPBOUND ) 15 MG/0.5ML Pen, Inject 15 mg into the skin once a week.   venlafaxine   (EFFEXOR ) 75 MG tablet, Take 1 tablet (75 mg total) by mouth daily.   venlafaxine  XR (EFFEXOR  XR) 37.5 MG 24 hr capsule, Take 1 capsule (37.5 mg total) by mouth daily with breakfast.   Vitamin D , Ergocalciferol , (DRISDOL ) 1.25 MG (50000 UNIT) CAPS capsule, Take 1 capsule (50,000 Units total) by mouth once a week.   Reviewed prior external information including notes and imaging from  primary care provider As well as notes that were available from care everywhere and other healthcare systems.  Past medical history, social, surgical and family history all reviewed in electronic medical record.  No pertanent information unless stated regarding to the chief complaint.   Review of Systems:  No headache, visual changes, nausea, vomiting, diarrhea, constipation, dizziness,  abdominal pain, skin rash, fevers, chills, night sweats, weight loss, swollen lymph nodes, body aches, joint swelling, chest pain, shortness of breath, mood changes. POSITIVE muscle aches  Objective  Blood pressure 128/88, pulse 89, height 5' 6 (1.676 m), weight 225 lb (102.1 kg), SpO2 98%, unknown if currently breastfeeding.   General: No apparent distress alert and oriented x3 mood and affect normal, dressed appropriately.  HEENT: Pupils equal, extraocular movements intact  Respiratory: Patient's speak in full sentences and does not appear short of breath  Cardiovascular: No lower extremity edema, non tender, no erythema  Worsening pain in the lower spine with palpation.  Worsening pain with radicular symptoms with forward flexion.  Positive straight leg test on the right side noted.  Negative FABER test noted.  Does have tightness though right greater than left.   Osteopathic findings C2 flexed rotated and side bent right C6 flexed rotated and side bent right T3 extended rotated and side bent right inhaled third rib T6 extended rotated and side bent left inhaled rib L2 flexed rotated and side bent right L4 flexed rotated  and side bent left Sacrum right on right    Impression and Recommendations:  Right lumbar radiculopathy Lumbar radiculopathy noted, discussed icing regimen and home exercises, discussed which activities to do and which ones his activity slowly.  Patient likely has no weakness.  Discussed the gabapentin  200 mg at night which patient does have a prescription for now restart taking.  I discussed prednisone  40 mg daily for 5 days.  X-rays of the lumbar spine ordered to rule out any patient is doing wonderful with weight loss.  Down another 11 pounds in the last 3 months.  Follow-up with me again in 6 to 12 weeks    Decision today to treat with OMT was based on Physical Exam  After verbal consent patient was treated with HVLA, ME, FPR techniques in cervical, thoracic, rib, lumbar and sacral areas, all areas are chronic   Patient tolerated the procedure well with improvement in symptoms  Patient given exercises, stretches and lifestyle modifications  See medications in patient instructions if given  Patient will follow up in 4-8 weeks   The above documentation has been reviewed and is accurate and complete Kristene Liberati M Titilayo Hagans, DO

## 2024-02-23 ENCOUNTER — Ambulatory Visit: Admitting: Family Medicine

## 2024-02-23 ENCOUNTER — Ambulatory Visit

## 2024-02-23 ENCOUNTER — Other Ambulatory Visit: Payer: Self-pay

## 2024-02-23 ENCOUNTER — Encounter: Payer: Self-pay | Admitting: Family Medicine

## 2024-02-23 ENCOUNTER — Encounter (INDEPENDENT_AMBULATORY_CARE_PROVIDER_SITE_OTHER): Payer: Self-pay

## 2024-02-23 ENCOUNTER — Other Ambulatory Visit (HOSPITAL_COMMUNITY): Payer: Self-pay

## 2024-02-23 VITALS — BP 128/88 | HR 89 | Ht 66.0 in | Wt 225.0 lb

## 2024-02-23 DIAGNOSIS — M9908 Segmental and somatic dysfunction of rib cage: Secondary | ICD-10-CM | POA: Diagnosis not present

## 2024-02-23 DIAGNOSIS — M9901 Segmental and somatic dysfunction of cervical region: Secondary | ICD-10-CM | POA: Diagnosis not present

## 2024-02-23 DIAGNOSIS — M25532 Pain in left wrist: Secondary | ICD-10-CM

## 2024-02-23 DIAGNOSIS — M25531 Pain in right wrist: Secondary | ICD-10-CM

## 2024-02-23 DIAGNOSIS — M5416 Radiculopathy, lumbar region: Secondary | ICD-10-CM | POA: Diagnosis not present

## 2024-02-23 DIAGNOSIS — M9904 Segmental and somatic dysfunction of sacral region: Secondary | ICD-10-CM | POA: Diagnosis not present

## 2024-02-23 DIAGNOSIS — M9902 Segmental and somatic dysfunction of thoracic region: Secondary | ICD-10-CM

## 2024-02-23 DIAGNOSIS — M47816 Spondylosis without myelopathy or radiculopathy, lumbar region: Secondary | ICD-10-CM | POA: Diagnosis not present

## 2024-02-23 DIAGNOSIS — M545 Low back pain, unspecified: Secondary | ICD-10-CM

## 2024-02-23 DIAGNOSIS — M9903 Segmental and somatic dysfunction of lumbar region: Secondary | ICD-10-CM

## 2024-02-23 MED ORDER — PREDNISONE 20 MG PO TABS
40.0000 mg | ORAL_TABLET | Freq: Every day | ORAL | 0 refills | Status: DC
  Filled 2024-02-23: qty 10, 5d supply, fill #0

## 2024-02-23 NOTE — Assessment & Plan Note (Signed)
 Lumbar radiculopathy noted, discussed icing regimen and home exercises, discussed which activities to do and which ones his activity slowly.  Patient likely has no weakness.  Discussed the gabapentin  200 mg at night which patient does have a prescription for now restart taking.  I discussed prednisone  40 mg daily for 5 days.  X-rays of the lumbar spine ordered to rule out any patient is doing wonderful with weight loss.  Down another 11 pounds in the last 3 months.  Follow-up with me again in 6 to 12 weeks

## 2024-02-23 NOTE — Patient Instructions (Addendum)
 Xray today Prednisone  40mg  for 5 days See me in 5-6 weeks

## 2024-02-25 ENCOUNTER — Ambulatory Visit: Admitting: Family Medicine

## 2024-02-25 DIAGNOSIS — F4322 Adjustment disorder with anxiety: Secondary | ICD-10-CM | POA: Diagnosis not present

## 2024-02-29 NOTE — Telephone Encounter (Signed)
Please see message below from pt. Thank you!

## 2024-03-01 ENCOUNTER — Ambulatory Visit: Payer: Self-pay | Admitting: Family Medicine

## 2024-03-04 ENCOUNTER — Encounter (HOSPITAL_COMMUNITY): Payer: Self-pay

## 2024-03-07 ENCOUNTER — Other Ambulatory Visit: Payer: Self-pay | Admitting: Family Medicine

## 2024-03-07 ENCOUNTER — Other Ambulatory Visit (HOSPITAL_COMMUNITY): Payer: Self-pay

## 2024-03-07 DIAGNOSIS — G4733 Obstructive sleep apnea (adult) (pediatric): Secondary | ICD-10-CM | POA: Diagnosis not present

## 2024-03-08 ENCOUNTER — Other Ambulatory Visit (HOSPITAL_COMMUNITY): Payer: Self-pay

## 2024-03-08 MED ORDER — VENLAFAXINE HCL 75 MG PO TABS
75.0000 mg | ORAL_TABLET | Freq: Every day | ORAL | 1 refills | Status: AC
  Filled 2024-03-08: qty 90, 90d supply, fill #0

## 2024-03-09 ENCOUNTER — Other Ambulatory Visit (HOSPITAL_COMMUNITY): Payer: Self-pay

## 2024-03-09 ENCOUNTER — Other Ambulatory Visit: Payer: Self-pay | Admitting: Family Medicine

## 2024-03-09 MED ORDER — PREDNISONE 50 MG PO TABS
50.0000 mg | ORAL_TABLET | Freq: Every day | ORAL | 0 refills | Status: AC
  Filled 2024-03-09: qty 5, 5d supply, fill #0

## 2024-03-09 NOTE — Progress Notes (Signed)
 Worsening lumbar radiculopathy noted.  Discussed with patient about when to seek medical attention.  Do think at this point will try prednisone .  Send in 50 mg for 5 days.

## 2024-03-10 ENCOUNTER — Ambulatory Visit: Admitting: Family Medicine

## 2024-03-10 ENCOUNTER — Encounter: Payer: Self-pay | Admitting: Family Medicine

## 2024-03-10 ENCOUNTER — Other Ambulatory Visit (HOSPITAL_COMMUNITY): Payer: Self-pay

## 2024-03-10 VITALS — BP 108/76 | HR 86 | Ht 66.0 in

## 2024-03-10 DIAGNOSIS — M25562 Pain in left knee: Secondary | ICD-10-CM | POA: Diagnosis not present

## 2024-03-10 DIAGNOSIS — G4733 Obstructive sleep apnea (adult) (pediatric): Secondary | ICD-10-CM | POA: Diagnosis not present

## 2024-03-10 DIAGNOSIS — M23007 Cystic meniscus, unspecified meniscus, left knee: Secondary | ICD-10-CM | POA: Diagnosis not present

## 2024-03-10 MED ORDER — DOXYCYCLINE HYCLATE 100 MG PO TABS
100.0000 mg | ORAL_TABLET | Freq: Two times a day (BID) | ORAL | 0 refills | Status: DC
  Filled 2024-03-10: qty 20, 10d supply, fill #0

## 2024-03-10 NOTE — Progress Notes (Signed)
 [name] [address] [place] [address] Phone: [phone] Subjective:   [name], am serving as a scribe for Dr. Arthea Claudene.  I'm seeing this patient by the request  of:  Prentiss Frieze, DO  CC: Left knee pain  YEP:Dlagzrupcz  MAILYN STEICHEN is a 48 y.o. female coming in with complaint of L knee pain. Went to a party on Tuesday and wore boots. Unsure if this caused her knee pain. Pain over medial aspect. Waking up at night due to pain from her back and knee.     Past Medical History:  Diagnosis Date   Allergy    Seasonal   Arthritis    knees   Asthma    as a child   Back pain    Food allergy    Tannin/sulfate in Wine and ciders, rash on face and hands   Joint pain    Lower extremity edema    Menorrhagia with regular cycle 04/02/2020   MMT (medial meniscus tear)    right   Sleep apnea    CPAP nightly   Umbilical hernia    Past Surgical History:  Procedure Laterality Date   BILATERAL SALPINGECTOMY Bilateral 04/15/2018   Procedure: BILATERAL SALPINGECTOMY;  Surgeon: Sarrah Browning, MD;  Location: [facility]IRTHING SU[facility];  Service: Obstetrics;  Laterality: Bilateral;   CESAREAN SECTION  11/12/2010   Procedure: CESAREAN SECTION;  Surgeon: Marjorie HILARIO Gull;  Location: [facility];  Service: Gynecology;  Laterality: N/A;   CESAREAN SECTION N/A 04/15/2018   Procedure: CESAREAN SECTION;  Surgeon: Sarrah Browning, MD;  Location: [facility]IRTHING SUIT[facility];  Service: Obstetrics;  Laterality: N/A;   KNEE ARTHROSCOPY Right 02/11/2017   Procedure: RIGHT KNEE ARTHROSCOPY WITH DEBRIDEMENT CHONDROMALACIA;  Surgeon: Liam Lerner, MD;  Location: [facility];  Service: Orthopedics;  Laterality: Right;   Social History   Socioeconomic History   Marital status: Married    Spouse name: Foye Haggart   Number of children: Not on file   Years of education: Not on file   Highest education level: Doctorate  Occupational History    Occupation: Education Officer, Environmental  Tobacco Use   Smoking status: Never   Smokeless tobacco: Never  Vaping Use   Vaping status: Never Used  Substance and Sexual Activity   Alcohol use: Yes    Comment: social   Drug use: No   Sexual activity: Yes    Birth control/protection: None  Other Topics Concern   Not on file  Social History Narrative   Not on file   Social Drivers of Health   Tobacco Use: Low Risk (02/23/2024)   Patient History    Smoking Tobacco Use: Never    Smokeless Tobacco Use: Never    Passive Exposure: Not on file  Financial Resource Strain: Low Risk (02/10/2024)   Overall Financial Resource Strain (CARDIA)    Difficulty of Paying Living Expenses: Not very hard  Food Insecurity: No Food Insecurity (02/10/2024)   Epic    Worried About Programme Researcher, Broadcasting/film/video in the Last Year: Never true    Ran Out of Food in the Last Year: Never true  Transportation Needs: No Transportation Needs (02/10/2024)   Epic    Lack of Transportation (Medical): No    Lack of Transportation (Non-Medical): No  Physical Activity: Sufficiently Active (02/10/2024)   Exercise Vital Sign    Days of Exercise per Week: 6 days    Minutes of Exercise per Session: 30 min  Stress:  Stress Concern Present (02/10/2024)   [employer] of Occupational Health - Occupational Stress Questionnaire    Feeling of Stress: To some extent  Social Connections: Socially Integrated (02/10/2024)   Social Connection and Isolation Panel    Frequency of Communication with Friends and Family: More than three times a week    Frequency of Social Gatherings with Friends and Family: More than three times a week    Attends Religious Services: More than 4 times per year    Active Member of Clubs or Organizations: Yes    Attends Banker Meetings: More than 4 times per year    Marital Status: Married  Depression (EYV7-0): Not on file  Alcohol Screen: Low Risk (02/10/2024)   Alcohol Screen    Last Alcohol Screening Score  (AUDIT): 3  Housing: Low Risk (02/10/2024)   Epic    Unable to Pay for Housing in the Last Year: No    Number of Times Moved in the Last Year: 0    Homeless in the Last Year: No  Utilities: Not on file  Health Literacy: Not on file   Allergies[1] Family History  Problem Relation Age of Onset   Hypertension Father    Hyperlipidemia Father    Heart disease Father    Cancer Maternal Grandfather    Heart disease Paternal Grandfather    Pulmonary embolism Mother    Alcohol abuse Mother     Current Outpatient Medications (Endocrine & Metabolic):    estradiol  (CLIMARA  - DOSED IN MG/24 HR) 0.025 mg/24hr patch, Place 1 patch (0.025 mg total) onto the skin once a week.   levonorgestrel  (MIRENA ) 20 MCG/24HR IUD, 1 each by Intrauterine route once.   predniSONE  (DELTASONE ) 50 MG tablet, Take 1 tablet (50 mg total) by mouth daily.  Current Outpatient Medications (Cardiovascular):    spironolactone  (ALDACTONE ) 50 MG tablet, Take 1 tablet (50 mg total) by mouth daily.  Current Outpatient Medications (Respiratory):    albuterol  (VENTOLIN  HFA) 108 (90 Base) MCG/ACT inhaler, Inhale 1 puff into the lungs every 4 (four) hours as needed.   cetirizine (ZYRTEC) 10 MG tablet, Take 10 mg by mouth daily.  Current Outpatient Medications (Analgesics):    meloxicam  (MOBIC ) 15 MG tablet, TAKE 1 TABLET(15 MG) BY MOUTH DAILY  Current Outpatient Medications (Other):    doxycycline  (VIBRA -TABS) 100 MG tablet, Take 1 tablet (100 mg total) by mouth 2 (two) times daily.   conjugated estrogens  (PREMARIN ) vaginal cream, Place 1 Applicatorful vaginally daily.   lisdexamfetamine  (VYVANSE ) 70 MG capsule, Take 1 capsule (70 mg total) by mouth in the morning.   Multiple Vitamins-Minerals (WOMENS MULTI VITAMIN & MINERAL PO), Take 1 tablet by mouth daily.   nystatin -triamcinolone  (MYCOLOG II) cream, Apply topically 2 times a day, as needed   tirzepatide  (ZEPBOUND ) 15 MG/0.5ML Pen, Inject 15 mg into the skin once a  week.   venlafaxine  (EFFEXOR ) 75 MG tablet, Take 1 tablet (75 mg total) by mouth daily.   venlafaxine  XR (EFFEXOR  XR) 37.5 MG 24 hr capsule, Take 1 capsule (37.5 mg total) by mouth daily with breakfast.   Vitamin D , Ergocalciferol , (DRISDOL ) 1.25 MG (50000 UNIT) CAPS capsule, Take 1 capsule (50,000 Units total) by mouth once a week.   Reviewed prior external information including notes and imaging from  primary care provider As well as notes that were available from care everywhere and other healthcare systems.  Past medical history, social, surgical and family history all reviewed in electronic medical record.  No pertanent information  unless stated regarding to the chief complaint.   Review of Systems:  No headache, visual changes, nausea, vomiting, diarrhea, constipation, dizziness, abdominal pain, skin rash, fevers, chills, night sweats, weight loss, swollen lymph nodes, body aches, joint swelling, chest pain, shortness of breath, mood changes. POSITIVE muscle aches  Objective  Blood pressure 108/76, pulse 86, height 5' 6 (1.676 m), SpO2 98%, unknown if currently breastfeeding.   General: No apparent distress alert and oriented x3 mood and affect normal, dressed appropriately.  HEENT: Pupils equal, extraocular movements intact  Respiratory: Patient's speak in full sentences and does not appear short of breath  Cardiovascular: No lower extremity edema, non tender, no erythema  Severely tender just minorly proximal of the medial joint space and just anterior to the midline area.  Patient has significant voluntary guarding noted.  Limited muscular skeletal ultrasound was performed and interpreted by CLAUDENE HUSSAR, M  Limited ultrasound shows that there is an acute on chronic medial meniscal tear noted but significant hypoechoic changes noted.  Discussed icing regimen and home exercises.  Hypoechoic mass noted that does appear to be a cyst.  Possible ganglion.  Does maybe have some  communication with the anterior horn of the medial meniscus  Impression: Questionable parameniscal cyst at area where patient is the most tender   Procedure: Real-time Ultrasound Guided Injection of left parameniscal cyst Device: GE Logiq Q7 Ultrasound guided injection is preferred based studies that show increased duration, increased effect, greater accuracy, decreased procedural pain, increased response rate, and decreased cost with ultrasound guided versus blind injection.  Verbal informed consent obtained.  Time-out conducted.  Noted no overlying erythema, induration, or other signs of local infection.  Skin prepped in a sterile fashion.  Local anesthesia: Topical Ethyl chloride.  With sterile technique and under real time ultrasound guidance: With a 21-gauge 2 inch needle injected with 0.5 cc 0.5% Marcaine  and under ultrasound saw deflation of the cyst occurred but did not remove significant amount of fluid.  Injected 1 cc of Kenalog  40 mg/mL. Completed without difficulty  Pain immediately resolved suggesting accurate placement of the medication.  Advised to call if fevers/chills, erythema, induration, drainage, or persistent bleeding.  Impression: Technically successful ultrasound guided injection.   Impression and Recommendations:     The above documentation has been reviewed and is accurate and complete Alanta Scobey M Brandilee Pies, DO       [1] No Known Allergies

## 2024-03-10 NOTE — Assessment & Plan Note (Signed)
 Noted.  Did have a cyst on the anterior SPECT of the knee.  Think that this is contributing more and did have aspiration with improvement in range of motion and immediately.  Discussed with patient to continue to monitor.  Does appear to be more of a ganglion and may need repeat aspiration.  Has an appointment in 2 to 3 weeks.

## 2024-03-10 NOTE — Patient Instructions (Addendum)
 Drained knee Doxy called in

## 2024-03-10 NOTE — Assessment & Plan Note (Signed)
 Patient had aspiration done today.  Seem to be more of a ganglion.  Discussed with patient about potential compression and also soreness for a day.  Discussed icing regimen and home exercises, and discussed which activities to do and which ones to avoid.  Increase activity slowly.  Follow-up again in 6 to 8 weeks

## 2024-03-15 ENCOUNTER — Other Ambulatory Visit (HOSPITAL_COMMUNITY): Payer: Self-pay

## 2024-03-15 ENCOUNTER — Ambulatory Visit: Admitting: Family Medicine

## 2024-03-15 VITALS — BP 134/82 | HR 89 | Ht 66.0 in | Wt 225.6 lb

## 2024-03-15 DIAGNOSIS — Z6836 Body mass index (BMI) 36.0-36.9, adult: Secondary | ICD-10-CM | POA: Diagnosis not present

## 2024-03-15 DIAGNOSIS — M25562 Pain in left knee: Secondary | ICD-10-CM | POA: Diagnosis not present

## 2024-03-15 DIAGNOSIS — F902 Attention-deficit hyperactivity disorder, combined type: Secondary | ICD-10-CM | POA: Diagnosis not present

## 2024-03-15 DIAGNOSIS — E66812 Obesity, class 2: Secondary | ICD-10-CM | POA: Diagnosis not present

## 2024-03-15 DIAGNOSIS — N958 Other specified menopausal and perimenopausal disorders: Secondary | ICD-10-CM

## 2024-03-15 DIAGNOSIS — N951 Menopausal and female climacteric states: Secondary | ICD-10-CM | POA: Diagnosis not present

## 2024-03-15 MED ORDER — LISDEXAMFETAMINE DIMESYLATE 70 MG PO CAPS
70.0000 mg | ORAL_CAPSULE | Freq: Every morning | ORAL | 0 refills | Status: DC
  Filled 2024-03-15: qty 30, 30d supply, fill #0

## 2024-03-15 NOTE — Progress Notes (Incomplete)
 "  Weight Management:   Starting weight: 268 lbs Starting date: 09/21/2019 Today's weight: 225lbs Today's date: 03/15/2024 Total lbs lost to date: -43 lbs Total lbs lost since last in-office visit: -0 lbs Total weight loss percentage to date: -16.04% Body mass index is 36.41 kg/m.   Interim History: ***.  Nutrition Plan: *** Anti-obesity medications: Vyvnase, Zepbound . Reported side effects: none. Hunger is well controlled. Cravings are moderately controlled. Activity: *** Sleep: Number of hours slept each night: 6. Sleep is not restful.   Subjective:   History of Present Illness Katie Glass is a 48 year old female who presents with left knee pain and recent cyst drainage.  Left knee pain - Severe burning pain localized to the left knee, described as the worst pain she has ever experienced - Pain disrupts sleep - Recent episode is persistent and not related to prior brief bilateral knee soreness from inappropriate footwear - Recent cyst drainage performed with some improvement - Currently using a patch and topical cream for the knee, with improvement noted after a couple of weeks of cream use  Sleep disturbance - Sleep disrupted by severe left knee pain - Poor sleep also associated with elevated resting heart rate  Elevated resting heart rate - Resting heart rate has been elevated recently - Recently prescribed prednisone , which she feels may have contributed to increased heart rate  Lumbar disc disease - Known disc problem - Initially considered possible contribution of disc disease to knee pain  Gastrointestinal symptoms - Stomach discomfort for the past two days - Symptoms possibly stress-related - Recent exposure to son with stomach bug  Medication and device use - IUD in place - Effexor  75 mg plus 37.5 mg resumed after a month-long lapse - B12 supplements in use  Review of Systems: Negative, with the exception of above mentioned in HPI.  History:    Reviewed by clinician on day of visit: allergies, medications, problem list, medical history, surgical history, family history, social history, and previous encounter notes.  Medications:   Show/hide medication list[1] Allergies[2]  Objective:   BP 134/82 (BP Location: Left Arm, Cuff Size: Large)   Pulse 89   Ht 5' 6 (1.676 m)   Wt 225 lb 9.6 oz (102.3 kg)   SpO2 99%   BMI 36.41 kg/m   Physical Exam Constitutional:      General: She is not in acute distress.    Appearance: She is well-developed.  HENT:     Head: Normocephalic and atraumatic.  Eyes:     Conjunctiva/sclera: Conjunctivae normal.  Cardiovascular:     Rate and Rhythm: Normal rate and regular rhythm.     Heart sounds: Normal heart sounds.  Pulmonary:     Effort: Pulmonary effort is normal.     Breath sounds: Normal breath sounds.  Neurological:     General: No focal deficit present.     Mental Status: She is alert.  Psychiatric:        Behavior: Behavior normal.    Diagnoses and Orders:   1. Attention deficit hyperactivity disorder (ADHD), combined type   2. Binge eating disorder, moderate   3. Perimenopausal symptoms   4. Genitourinary syndrome of menopause    Assessment & Plan:   Assessment and Plan Assessment & Plan Genitourinary syndrome of menopause Improvement with estradiol  patch and conjugated  estrogens  cream. - Continue estradiol  patch and conjugated  estrogens  cream as prescribed.  Perimenopausal symptoms Managed with current hormone therapy. - Continue current hormone therapy regimen.  Attention  deficit hyperactivity disorder, combined type Symptoms managed with Vyvanse . Improved focus and stress handling. Sunsama used for transport planner. - Refilled Vyvanse  prescription.  Class 2 severe obesity Weight loss of 4 pounds. Muscle mass decreased, fat mass increased.  General health maintenance Discussed healthy lifestyle and technology use for task management. - Encouraged  continued use of Sunsama for task management. - Encouraged maintaining a healthy lifestyle.  Geni Shutter, DO, MS, FAAFP, Dipl. [facility] [address] [place], [address] Dept: [phone] Dept Fax: [phone]  Attestations:   Discussed the use of AI scribe software for clinical note transcription with the patient, who gave verbal consent to proceed. Reviewed by clinician on day of visit: allergies, medications, problem list, medical history, surgical history, family history, social history, and previous encounter notes.     [1]  Outpatient Medications Prior to Visit  Medication Sig   albuterol  (VENTOLIN  HFA) 108 (90 Base) MCG/ACT inhaler Inhale 1 puff into the lungs every 4 (four) hours as needed.   cetirizine (ZYRTEC) 10 MG tablet Take 10 mg by mouth daily.   conjugated estrogens  (PREMARIN ) vaginal cream Place 1 Applicatorful vaginally daily.   estradiol  (CLIMARA  - DOSED IN MG/24 HR) 0.025 mg/24hr patch Place 1 patch (0.025 mg total) onto the skin once a week.   levonorgestrel  (MIRENA ) 20 MCG/24HR IUD 1 each by Intrauterine route once.   lisdexamfetamine  (VYVANSE ) 70 MG capsule Take 1 capsule (70 mg total) by mouth in the morning.   meloxicam  (MOBIC ) 15 MG tablet TAKE 1 TABLET(15 MG) BY MOUTH DAILY   Multiple Vitamins-Minerals (WOMENS MULTI VITAMIN & MINERAL PO) Take 1 tablet by mouth daily.   nystatin -triamcinolone  (MYCOLOG II) cream Apply topically 2 times a day, as needed   predniSONE  (DELTASONE ) 50 MG tablet Take 1 tablet (50 mg total) by mouth daily.   spironolactone  (ALDACTONE ) 50 MG tablet Take 1 tablet (50 mg total) by mouth daily.   tirzepatide  (ZEPBOUND ) 15 MG/0.5ML Pen Inject 15 mg into the skin once a week.   venlafaxine  (EFFEXOR ) 75 MG tablet Take 1 tablet (75 mg total) by mouth daily.   venlafaxine  XR (EFFEXOR  XR) 37.5 MG 24 hr capsule Take 1 capsule (37.5 mg total) by mouth daily with breakfast.   Vitamin D ,  Ergocalciferol , (DRISDOL ) 1.25 MG (50000 UNIT) CAPS capsule Take 1 capsule (50,000 Units total) by mouth once a week.   [DISCONTINUED] doxycycline  (VIBRA -TABS) 100 MG tablet Take 1 tablet (100 mg total) by mouth 2 (two) times daily.   No facility-administered medications prior to visit.  [2] No Known Allergies  "

## 2024-03-17 ENCOUNTER — Encounter: Payer: Self-pay | Admitting: Family Medicine

## 2024-03-17 NOTE — Progress Notes (Signed)
 "  Weight Management:   Starting weight: 268 lbs Starting date: 09/21/2019 Today's weight: 225lbs Today's date: 03/17/2024 Total lbs lost to date: -43 lbs Total lbs lost since last in-office visit: -4 lbs Total weight loss percentage to date: -16.04% Body mass index is 36.41 kg/m.   Nutrition Plan: Mindfulness, focused on adequate protein.  Anti-obesity medications: Vyvanse , Zepbound . Reported side effects: none. Hunger is well controlled. Cravings are moderately controlled. Activity: FIA, walking. Sleep: Number of hours slept each night: 6. Sleep is not restful.   Subjective:   History of Present Illness Katie Glass is a 48 year old female who presents with left knee pain and recent cyst drainage.  Left knee pain - Severe burning pain localized to the left knee, described as the worst pain she has ever experienced - Pain disrupts sleep - Recent episode is persistent and not related to prior brief bilateral knee soreness from inappropriate footwear - Recent cyst drainage performed with some improvement - Currently using a patch and topical cream for the knee, with improvement noted after a couple of weeks of cream use  Sleep disturbance - Sleep disrupted by severe left knee pain  Gastrointestinal symptoms - Stomach discomfort for the past two days - Symptoms possibly stress-related - Recent exposure to son with stomach bug  Medication and device use - IUD in place - Effexor  75 mg plus 37.5 mg resumed after a month-long lapse - B12 supplements in use  Review of Systems: Negative, with the exception of above mentioned in HPI.  History:   Reviewed by clinician on day of visit: allergies, medications, problem list, medical history, surgical history, family history, social history, and previous encounter notes.  Medications:   Show/hide medication list[1] Allergies[2]  Objective:   BP 134/82 (BP Location: Left Arm, Cuff Size: Large)   Pulse 89   Ht 5' 6  (1.676 m)   Wt 225 lb 9.6 oz (102.3 kg)   SpO2 99%   BMI 36.41 kg/m   Physical Exam Constitutional:      General: She is not in acute distress.    Appearance: She is well-developed.  HENT:     Head: Normocephalic and atraumatic.  Eyes:     Conjunctiva/sclera: Conjunctivae normal.  Cardiovascular:     Rate and Rhythm: Normal rate and regular rhythm.     Heart sounds: Normal heart sounds.  Pulmonary:     Effort: Pulmonary effort is normal.     Breath sounds: Normal breath sounds.  Neurological:     General: No focal deficit present.     Mental Status: She is alert.  Psychiatric:        Behavior: Behavior normal.    Diagnoses and Orders:   1. Genitourinary syndrome of (peri)menopause   2. Attention deficit hyperactivity disorder (ADHD), combined type   3. Perimenopausal symptoms   4. Acute pain of left knee, improving   5. Class 2 obesity without serious comorbidity with body mass index (BMI) of 36.0 to 36.9 in adult, unspecified obesity type    Meds ordered this encounter  Medications   lisdexamfetamine  (VYVANSE ) 70 MG capsule    Sig: Take 1 capsule (70 mg total) by mouth in the morning.    Dispense:  30 capsule    Refill:  0   Assessment & Plan:   Assessment and Plan Assessment & Plan Genitourinary syndrome of menopause Improvement with estradiol  patch and conjugated  estrogens  cream. - Continue estradiol  patch and conjugated  estrogens  cream as prescribed.  Perimenopausal  symptoms Managed with current hormone therapy. - Continue current hormone therapy regimen.  Attention deficit hyperactivity disorder, combined type Symptoms managed with Vyvanse . Improved focus and stress handling. Sunsama used for transport planner. - Refilled Vyvanse  prescription.  Class 2 severe obesity Weight loss of 4 pounds. Muscle mass decreased, fat mass increased.   General health maintenance Discussed healthy lifestyle and technology use for task management. - Encouraged continued  use of Sunsama for task management. - Encouraged maintaining a healthy lifestyle.  Geni Shutter, DO, MS, FAAFP, Dipl. [facility] at [facility] [address] [place], [address] Dept: [phone] Dept Fax: [phone]  Attestations:   Discussed the use of AI scribe software for clinical note transcription with the patient, who gave verbal consent to proceed. Reviewed by clinician on day of visit: allergies, medications, problem list, medical history, surgical history, family history, social history, and previous encounter notes.      [1]  Outpatient Medications Prior to Visit  Medication Sig   albuterol  (VENTOLIN  HFA) 108 (90 Base) MCG/ACT inhaler Inhale 1 puff into the lungs every 4 (four) hours as needed.   cetirizine (ZYRTEC) 10 MG tablet Take 10 mg by mouth daily.   conjugated estrogens  (PREMARIN ) vaginal cream Place 1 Applicatorful vaginally daily.   estradiol  (CLIMARA  - DOSED IN MG/24 HR) 0.025 mg/24hr patch Place 1 patch (0.025 mg total) onto the skin once a week.   levonorgestrel  (MIRENA ) 20 MCG/24HR IUD 1 each by Intrauterine route once.   meloxicam  (MOBIC ) 15 MG tablet TAKE 1 TABLET(15 MG) BY MOUTH DAILY   Multiple Vitamins-Minerals (WOMENS MULTI VITAMIN & MINERAL PO) Take 1 tablet by mouth daily.   nystatin -triamcinolone  (MYCOLOG II) cream Apply topically 2 times a day, as needed   predniSONE  (DELTASONE ) 50 MG tablet Take 1 tablet (50 mg total) by mouth daily.   spironolactone  (ALDACTONE ) 50 MG tablet Take 1 tablet (50 mg total) by mouth daily.   tirzepatide  (ZEPBOUND ) 15 MG/0.5ML Pen Inject 15 mg into the skin once a week.   venlafaxine  (EFFEXOR ) 75 MG tablet Take 1 tablet (75 mg total) by mouth daily.   venlafaxine  XR (EFFEXOR  XR) 37.5 MG 24 hr capsule Take 1 capsule (37.5 mg total) by mouth daily with breakfast.   Vitamin D , Ergocalciferol , (DRISDOL ) 1.25 MG (50000 UNIT) CAPS capsule Take 1 capsule (50,000 Units total) by mouth once  a week.   [DISCONTINUED] doxycycline  (VIBRA -TABS) 100 MG tablet Take 1 tablet (100 mg total) by mouth 2 (two) times daily.   [DISCONTINUED] lisdexamfetamine  (VYVANSE ) 70 MG capsule Take 1 capsule (70 mg total) by mouth in the morning.   No facility-administered medications prior to visit.  [2] No Known Allergies  "

## 2024-03-29 NOTE — Progress Notes (Unsigned)
 " [facility] [address] [place] [address] Phone: [phone] Subjective:   Katie Glass Berwyn Posey, am serving as a scribe for Dr. Arthea Claudene.  I'm seeing this patient by the request  of:  Prentiss Frieze, DO  CC: Left knee pain follow-up, back pain follow-up  YEP:Dlagzrupcz  03/10/2024 Patient had aspiration done today.  Seem to be more of a ganglion.  Discussed with patient about potential compression and also soreness for a day.  Discussed icing regimen and home exercises, and discussed which activities to do and which ones to avoid.  Increase activity slowly.  Follow-up again in 6 to 8 weeks     Noted.  Did have a cyst on the anterior SPECT of the knee.  Think that this is contributing more and did have aspiration with improvement in range of motion and immediately.  Discussed with patient to continue to monitor.  Does appear to be more of a ganglion and may need repeat aspiration.  Has an appointment in 2 to 3 weeks.      Update 03/30/2024 Katie Glass is a 49 y.o. female coming in with complaint of L knee pain. Patient states that her knee is doing well. After 2 days her pain subsided.   Pain in R hip that radiates down lateral aspect of R leg. Feels like she has tight muscle in hip that is contributing to her pain. Has been stretching a lot more recently. Able to workout but pain worsens when she is at rest.        Past Medical History:  Diagnosis Date   Allergy    Seasonal   Arthritis    knees   Asthma    as a child   Back pain    Food allergy    Tannin/sulfate in Wine and ciders, rash on face and hands   Joint pain    Lower extremity edema    Menorrhagia with regular cycle 04/02/2020   MMT (medial meniscus tear)    right   Sleep apnea    CPAP nightly   Umbilical hernia    Past Surgical History:  Procedure Laterality Date   BILATERAL SALPINGECTOMY Bilateral 04/15/2018   Procedure: BILATERAL SALPINGECTOMY;  Surgeon:  Sarrah Browning, MD;  Location: [facility] BIRTHING SUITES;  Service: Obstetrics;  Laterality: Bilateral;   CESAREAN SECTION  11/12/2010   Procedure: CESAREAN SECTION;  Surgeon: Marjorie HILARIO Gull;  Location: [facility] ORS;  Service: Gynecology;  Laterality: N/A;   CESAREAN SECTION N/A 04/15/2018   Procedure: CESAREAN SECTION;  Surgeon: Sarrah Browning, MD;  Location: [facility] BIRTHING SUITES;  Service: Obstetrics;  Laterality: N/A;   KNEE ARTHROSCOPY Right 02/11/2017   Procedure: RIGHT KNEE ARTHROSCOPY WITH DEBRIDEMENT CHONDROMALACIA;  Surgeon: Liam Lerner, MD;  Location: [facility];  Service: Orthopedics;  Laterality: Right;   Social History   Socioeconomic History   Marital status: Married    Spouse name: Katie Glass   Number of children: Not on file   Years of education: Not on file   Highest education level: Doctorate  Occupational History   Occupation: Education Officer, Environmental  Tobacco Use   Smoking status: Never   Smokeless tobacco: Never  Vaping Use   Vaping status: Never Used  Substance and Sexual Activity   Alcohol use: Yes    Comment: social   Drug use: No   Sexual activity: Yes    Birth control/protection: None  Other Topics Concern   Not on file  Social History  Narrative   Not on file   Social Drivers of Health   Tobacco Use: Low Risk (03/17/2024)   Patient History    Smoking Tobacco Use: Never    Smokeless Tobacco Use: Never    Passive Exposure: Not on file  Financial Resource Strain: Low Risk (02/10/2024)   Overall Financial Resource Strain (CARDIA)    Difficulty of Paying Living Expenses: Not very hard  Food Insecurity: No Food Insecurity (02/10/2024)   Epic    Worried About Programme Researcher, Broadcasting/film/video in the Last Year: Never true    Ran Out of Food in the Last Year: Never true  Transportation Needs: No Transportation Needs (02/10/2024)   Epic    Lack of Transportation (Medical): No    Lack of Transportation (Non-Medical): No  Physical Activity: Sufficiently Active (02/10/2024)    Exercise Vital Sign    Days of Exercise per Week: 6 days    Minutes of Exercise per Session: 30 min  Stress: Stress Concern Present (02/10/2024)   [employer] of Occupational Health - Occupational Stress Questionnaire    Feeling of Stress: To some extent  Social Connections: Socially Integrated (02/10/2024)   Social Connection and Isolation Panel    Frequency of Communication with Friends and Family: More than three times a week    Frequency of Social Gatherings with Friends and Family: More than three times a week    Attends Religious Services: More than 4 times per year    Active Member of Clubs or Organizations: Yes    Attends Banker Meetings: More than 4 times per year    Marital Status: Married  Depression (EYV7-0): Not on file  Alcohol Screen: Low Risk (02/10/2024)   Alcohol Screen    Last Alcohol Screening Score (AUDIT): 3  Housing: Low Risk (02/10/2024)   Epic    Unable to Pay for Housing in the Last Year: No    Number of Times Moved in the Last Year: 0    Homeless in the Last Year: No  Utilities: Not on file  Health Literacy: Not on file   Allergies[1] Family History  Problem Relation Age of Onset   Hypertension Father    Hyperlipidemia Father    Heart disease Father    Cancer Maternal Grandfather    Heart disease Paternal Grandfather    Pulmonary embolism Mother    Alcohol abuse Mother     Current Outpatient Medications (Endocrine & Metabolic):    estradiol  (CLIMARA  - DOSED IN MG/24 HR) 0.025 mg/24hr patch, Place 1 patch (0.025 mg total) onto the skin once a week.   levonorgestrel  (MIRENA ) 20 MCG/24HR IUD, 1 each by Intrauterine route once.   predniSONE  (DELTASONE ) 50 MG tablet, Take 1 tablet (50 mg total) by mouth daily.  Current Outpatient Medications (Cardiovascular):    spironolactone  (ALDACTONE ) 50 MG tablet, Take 1 tablet (50 mg total) by mouth daily.  Current Outpatient Medications (Respiratory):    albuterol  (VENTOLIN  HFA) 108  (90 Base) MCG/ACT inhaler, Inhale 1 puff into the lungs every 4 (four) hours as needed.   cetirizine (ZYRTEC) 10 MG tablet, Take 10 mg by mouth daily.  Current Outpatient Medications (Analgesics):    meloxicam  (MOBIC ) 15 MG tablet, TAKE 1 TABLET(15 MG) BY MOUTH DAILY  Current Outpatient Medications (Other):    conjugated estrogens  (PREMARIN ) vaginal cream, Place 1 Applicatorful vaginally daily.   lisdexamfetamine  (VYVANSE ) 70 MG capsule, Take 1 capsule (70 mg total) by mouth in the morning.   Multiple Vitamins-Minerals (WOMENS MULTI VITAMIN &  MINERAL PO), Take 1 tablet by mouth daily.   nystatin -triamcinolone  (MYCOLOG II) cream, Apply topically 2 times a day, as needed   tirzepatide  (ZEPBOUND ) 15 MG/0.5ML Pen, Inject 15 mg into the skin once a week.   venlafaxine  (EFFEXOR ) 75 MG tablet, Take 1 tablet (75 mg total) by mouth daily.   venlafaxine  XR (EFFEXOR  XR) 37.5 MG 24 hr capsule, Take 1 capsule (37.5 mg total) by mouth daily with breakfast.   Vitamin D , Ergocalciferol , (DRISDOL ) 1.25 MG (50000 UNIT) CAPS capsule, Take 1 capsule (50,000 Units total) by mouth once a week.   Reviewed prior external information including notes and imaging from  primary care provider As well as notes that were available from care everywhere and other healthcare systems.  Past medical history, social, surgical and family history all reviewed in electronic medical record.  No pertanent information unless stated regarding to the chief complaint.   Review of Systems:  No headache, visual changes, nausea, vomiting, diarrhea, constipation, dizziness, abdominal pain, skin rash, fevers, chills, night sweats, weight loss, swollen lymph nodes, body aches, joint swelling, chest pain, shortness of breath, mood changes. POSITIVE muscle aches  Objective  Blood pressure 108/78, height 5' 6 (1.676 m), weight 223 lb (101.2 kg), unknown if currently breastfeeding.   General: No apparent distress alert and oriented x3 mood  and affect normal, dressed appropriately.  HEENT: Pupils equal, extraocular movements intact  Respiratory: Patient's speak in full sentences and does not appear short of breath  Cardiovascular: No lower extremity edema, non tender, no erythema  Low back exam shows tightness noted with FABER right greater than left.  Patient does have tightness noted over the piriformis area. Tightness with straight leg test.  Patient does have tenderness to palpation in the paraspinal musculature right greater than left.   Osteopathic findings C2 flexed rotated and side bent right C7 flexed rotated and side bent right T6 extended rotated and side bent right inhaled third rib L1 flexed rotated and side bent right L4 flexed rotated and side bent right Sacrum right on right      Impression and Recommendations:    Right lumbar radiculopathy Chronic, with exacerbation.  Continuing the gabapentin  and the meloxicam .  Discussed with patient about icing regimen, home exercises, discussed with patient osteopathic manipulation and patient is considering chiropractic care.  We discussed core strengthening.  Worsening pain or any weakness do need to consider the possibility of MRI.  I do not think that this would change medical management at this time.  Follow-up again in 6 weeks     Decision today to treat with OMT was based on Physical Exam  After verbal consent patient was treated with HVLA, ME, FPR techniques in cervical, thoracic, rib, lumbar and sacral areas, all areas are chronic   Patient tolerated the procedure well with improvement in symptoms  Patient given exercises, stretches and lifestyle modifications  See medications in patient instructions if given  Patient will follow up in 4-8 weeks  The above documentation has been reviewed and is accurate and complete Gerrad Welker M Kacin Dancy, DO        [1] No Known Allergies  "

## 2024-03-30 ENCOUNTER — Ambulatory Visit: Admitting: Family Medicine

## 2024-03-30 ENCOUNTER — Other Ambulatory Visit: Payer: Self-pay

## 2024-03-30 ENCOUNTER — Encounter: Payer: Self-pay | Admitting: Family Medicine

## 2024-03-30 VITALS — BP 108/78 | Ht 66.0 in | Wt 223.0 lb

## 2024-03-30 DIAGNOSIS — M5416 Radiculopathy, lumbar region: Secondary | ICD-10-CM

## 2024-03-30 DIAGNOSIS — M9908 Segmental and somatic dysfunction of rib cage: Secondary | ICD-10-CM | POA: Diagnosis not present

## 2024-03-30 DIAGNOSIS — G8929 Other chronic pain: Secondary | ICD-10-CM | POA: Diagnosis not present

## 2024-03-30 DIAGNOSIS — M9904 Segmental and somatic dysfunction of sacral region: Secondary | ICD-10-CM | POA: Diagnosis not present

## 2024-03-30 DIAGNOSIS — M9902 Segmental and somatic dysfunction of thoracic region: Secondary | ICD-10-CM | POA: Diagnosis not present

## 2024-03-30 DIAGNOSIS — M9903 Segmental and somatic dysfunction of lumbar region: Secondary | ICD-10-CM | POA: Diagnosis not present

## 2024-03-30 DIAGNOSIS — M9901 Segmental and somatic dysfunction of cervical region: Secondary | ICD-10-CM | POA: Diagnosis not present

## 2024-03-30 NOTE — Assessment & Plan Note (Signed)
 Chronic, with exacerbation.  Continuing the gabapentin  and the meloxicam .  Discussed with patient about icing regimen, home exercises, discussed with patient osteopathic manipulation and patient is considering chiropractic care.  We discussed core strengthening.  Worsening pain or any weakness do need to consider the possibility of MRI.  I do not think that this would change medical management at this time.  Follow-up again in 6 weeks

## 2024-03-30 NOTE — Patient Instructions (Signed)
 See me in 6 weeks

## 2024-04-01 ENCOUNTER — Ambulatory Visit: Admitting: Family Medicine

## 2024-04-07 ENCOUNTER — Ambulatory Visit: Admitting: Family Medicine

## 2024-04-07 ENCOUNTER — Other Ambulatory Visit (HOSPITAL_COMMUNITY): Payer: Self-pay

## 2024-04-07 ENCOUNTER — Other Ambulatory Visit: Payer: Self-pay | Admitting: Family Medicine

## 2024-04-07 DIAGNOSIS — F902 Attention-deficit hyperactivity disorder, combined type: Secondary | ICD-10-CM

## 2024-04-07 MED ORDER — LISDEXAMFETAMINE DIMESYLATE 70 MG PO CAPS
70.0000 mg | ORAL_CAPSULE | Freq: Every morning | ORAL | 0 refills | Status: DC
  Filled 2024-04-07 – 2024-04-10 (×2): qty 30, 30d supply, fill #0

## 2024-04-10 ENCOUNTER — Other Ambulatory Visit (HOSPITAL_COMMUNITY): Payer: Self-pay

## 2024-04-19 NOTE — Progress Notes (Unsigned)
 "    Patient Care Team: Prentiss Frieze, DO as PCP - General (Family Medicine)  Weight Management:   Starting weight: 268 lbs Starting date: 09/21/2019 Today's weight: lbs Today's date: 01/17/2025 Total lbs lost to date: -43 lbs Total lbs lost since last in-office visit: -4 lbs Total weight loss percentage to date: -16.04% Body mass index is 36.41 kg/m.    Nutrition Plan: Mindfulness, focused on adequate protein.  Anti-obesity medications: Vyvanse , Zepbound . Reported side effects: none. Hunger is well controlled. Cravings are moderately controlled. Activity: FIA, walking. Sleep: Number of hours slept each night: 6. Sleep is not restful.  Diagnoses and Orders:   No diagnosis found. No orders of the defined types were placed in this encounter.  No orders of the defined types were placed in this encounter.  Assessment & Plan:  Assessment and Plan     Frieze Prentiss, DO, MS (Nutrition), FAAFP, Dipl. ABOM Fellow, American Academy of Family Physicians Diplomate, American Board of Obesity Medicine [facility] [address] [place], [place] [address] Dept: [phone] Fax: [phone]  Subjective:     Review of Systems: Negative, with the exception of above mentioned in HPI.  History:   Reviewed by clinician on day of visit: allergies, medications, problem list, medical history, surgical history, family history, social history, and previous encounter notes.  Medications:   Show/hide medication list[1] Allergies[2]  Objective:   There were no vitals taken for this visit.  Physical Exam Results for orders placed or performed in visit on 06/28/20  CBC with Differential/Platelet   Collection Time: 06/28/20  2:46 PM  Result Value Ref Range   WBC 6.7 3.4 - 10.8 x10E3/uL   RBC 4.74 3.77 - 5.28 x10E6/uL   Hemoglobin 14.9 11.1 - 15.9 g/dL   Hematocrit 55.7 65.9 - 46.6 %   MCV 93 79 - 97 fL   MCH 31.4 26.6 -  33.0 pg   MCHC 33.7 31.5 - 35.7 g/dL   RDW 88.1 88.2 - 84.5 %   Platelets 209 150 - 450 x10E3/uL   Neutrophils 72 Not Estab. %   Lymphs 19 Not Estab. %   Monocytes 7 Not Estab. %   Eos 2 Not Estab. %   Basos 0 Not Estab. %   Neutrophils Absolute 4.8 1.4 - 7.0 x10E3/uL   Lymphocytes Absolute 1.3 0.7 - 3.1 x10E3/uL   Monocytes Absolute 0.5 0.1 - 0.9 x10E3/uL   EOS (ABSOLUTE) 0.1 0.0 - 0.4 x10E3/uL   Basophils Absolute 0.0 0.0 - 0.2 x10E3/uL   Immature Granulocytes 0 Not Estab. %   Immature Grans (Abs) 0.0 0.0 - 0.1 x10E3/uL  Comprehensive metabolic panel   Collection Time: 06/28/20  2:46 PM  Result Value Ref Range   Glucose 91 65 - 99 mg/dL   BUN 16 6 - 24 mg/dL   Creatinine, Ser 9.05 0.57 - 1.00 mg/dL   eGFR 77 >40 fO/fpw/8.26   BUN/Creatinine Ratio 17 9 - 23   Sodium 139 134 - 144 mmol/L   Potassium 4.3 3.5 - 5.2 mmol/L   Chloride 103 96 - 106 mmol/L   CO2 21 20 - 29 mmol/L   Calcium 9.3 8.7 - 10.2 mg/dL   Total Protein 7.3 6.0 - 8.5 g/dL   Albumin 4.5 3.8 - 4.8 g/dL   Globulin, Total 2.8 1.5 - 4.5 g/dL   Albumin/Globulin Ratio 1.6 1.2 - 2.2   Bilirubin Total 0.6 0.0 - 1.2 mg/dL   Alkaline Phosphatase 44 44 -  121 IU/L   AST 14 0 - 40 IU/L   ALT 11 0 - 32 IU/L  Hemoglobin A1c   Collection Time: 06/28/20  2:46 PM  Result Value Ref Range   Hgb A1c MFr Bld 5.4 4.8 - 5.6 %   Est. average glucose Bld gHb Est-mCnc 108 mg/dL  Insulin , random   Collection Time: 06/28/20  2:46 PM  Result Value Ref Range   INSULIN  4.0 2.6 - 24.9 uIU/mL  Lipid Panel With LDL/HDL Ratio   Collection Time: 06/28/20  2:46 PM  Result Value Ref Range   Cholesterol, Total 155 100 - 199 mg/dL   Triglycerides 57 0 - 149 mg/dL   HDL 62 >60 mg/dL   VLDL Cholesterol Cal 12 5 - 40 mg/dL   LDL Chol Calc (NIH) 81 0 - 99 mg/dL   LDL/HDL Ratio 1.3 0.0 - 3.2 ratio  Vitamin B12   Collection Time: 06/28/20  2:46 PM  Result Value Ref Range   Vitamin B-12 416 232 - 1,245 pg/mL  VITAMIN D  25 Hydroxy (Vit-D  Deficiency, Fractures)   Collection Time: 06/28/20  2:46 PM  Result Value Ref Range   Vit D, 25-Hydroxy 40.6 30.0 - 100.0 ng/mL    04/19/2024    PHQ2-9 Depression Screening   Little interest or pleasure in doing things    Feeling down, depressed, or hopeless    PHQ-2 - Total Score    Trouble falling or staying asleep, or sleeping too much    Feeling tired or having little energy    Poor appetite or overeating     Feeling bad about yourself - or that you are a failure or have let yourself or your family down    Trouble concentrating on things, such as reading the newspaper or watching television    Moving or speaking so slowly that other people could have noticed.  Or the opposite - being so fidgety or restless that you have been moving around a lot more than usual    Thoughts that you would be better off dead, or hurting yourself in some way    PHQ2-9 Total Score    If you checked off any problems, how difficult have these problems made it for you to do your work, take care of things at home, or get along with other people    Depression Interventions/Treatment          No data to display         Attestations:   {EW ATTESTATIONS:34266}    [1]  Outpatient Medications Prior to Visit  Medication Sig   albuterol  (VENTOLIN  HFA) 108 (90 Base) MCG/ACT inhaler Inhale 1 puff into the lungs every 4 (four) hours as needed.   cetirizine (ZYRTEC) 10 MG tablet Take 10 mg by mouth daily.   conjugated estrogens  (PREMARIN ) vaginal cream Place 1 Applicatorful vaginally daily.   estradiol  (CLIMARA  - DOSED IN MG/24 HR) 0.025 mg/24hr patch Place 1 patch (0.025 mg total) onto the skin once a week.   levonorgestrel  (MIRENA ) 20 MCG/24HR IUD 1 each by Intrauterine route once.   lisdexamfetamine  (VYVANSE ) 70 MG capsule Take 1 capsule (70 mg total) by mouth in the morning.   meloxicam  (MOBIC ) 15 MG tablet TAKE 1 TABLET(15 MG) BY MOUTH DAILY   Multiple Vitamins-Minerals (WOMENS MULTI VITAMIN & MINERAL  PO) Take 1 tablet by mouth daily.   nystatin -triamcinolone  (MYCOLOG II) cream Apply topically 2 times a day, as needed   predniSONE  (DELTASONE ) 50 MG tablet Take 1  tablet (50 mg total) by mouth daily.   spironolactone  (ALDACTONE ) 50 MG tablet Take 1 tablet (50 mg total) by mouth daily.   tirzepatide  (ZEPBOUND ) 15 MG/0.5ML Pen Inject 15 mg into the skin once a week.   venlafaxine  (EFFEXOR ) 75 MG tablet Take 1 tablet (75 mg total) by mouth daily.   venlafaxine  XR (EFFEXOR  XR) 37.5 MG 24 hr capsule Take 1 capsule (37.5 mg total) by mouth daily with breakfast.   Vitamin D , Ergocalciferol , (DRISDOL ) 1.25 MG (50000 UNIT) CAPS capsule Take 1 capsule (50,000 Units total) by mouth once a week.   No facility-administered medications prior to visit.  [2] No Known Allergies  "

## 2024-04-20 ENCOUNTER — Encounter: Payer: Self-pay | Admitting: Family Medicine

## 2024-04-20 ENCOUNTER — Ambulatory Visit: Admitting: Family Medicine

## 2024-04-20 VITALS — BP 124/80 | HR 74 | Ht 66.0 in | Wt 218.6 lb

## 2024-04-20 DIAGNOSIS — E66812 Obesity, class 2: Secondary | ICD-10-CM

## 2024-04-20 DIAGNOSIS — N958 Other specified menopausal and perimenopausal disorders: Secondary | ICD-10-CM

## 2024-04-20 DIAGNOSIS — Z6835 Body mass index (BMI) 35.0-35.9, adult: Secondary | ICD-10-CM

## 2024-04-20 DIAGNOSIS — G4733 Obstructive sleep apnea (adult) (pediatric): Secondary | ICD-10-CM

## 2024-04-20 DIAGNOSIS — F902 Attention-deficit hyperactivity disorder, combined type: Secondary | ICD-10-CM | POA: Diagnosis not present

## 2024-04-20 DIAGNOSIS — Z23 Encounter for immunization: Secondary | ICD-10-CM | POA: Diagnosis not present

## 2024-04-20 DIAGNOSIS — E559 Vitamin D deficiency, unspecified: Secondary | ICD-10-CM | POA: Diagnosis not present

## 2024-04-22 ENCOUNTER — Telehealth (HOSPITAL_COMMUNITY): Payer: Self-pay

## 2024-04-22 ENCOUNTER — Other Ambulatory Visit (HOSPITAL_COMMUNITY): Payer: Self-pay

## 2024-04-22 ENCOUNTER — Other Ambulatory Visit: Payer: Self-pay

## 2024-04-22 MED ORDER — LISDEXAMFETAMINE DIMESYLATE 70 MG PO CAPS
70.0000 mg | ORAL_CAPSULE | Freq: Every morning | ORAL | 0 refills | Status: AC
  Filled 2024-04-22: qty 30, 30d supply, fill #0

## 2024-04-22 MED ORDER — ZEPBOUND 15 MG/0.5ML ~~LOC~~ SOAJ
15.0000 mg | SUBCUTANEOUS | 3 refills | Status: AC
  Filled 2024-04-22: qty 6, 84d supply, fill #0

## 2024-04-22 MED ORDER — VITAMIN D (ERGOCALCIFEROL) 1.25 MG (50000 UNIT) PO CAPS
50000.0000 [IU] | ORAL_CAPSULE | ORAL | 0 refills | Status: AC
  Filled 2024-04-22: qty 12, 84d supply, fill #0

## 2024-04-22 NOTE — Telephone Encounter (Signed)
 Pharmacy Patient Advocate Encounter   Received notification from Pt Calls Messages that prior authorization for Zepbound  15 mg/0.5 ml auto injectors is required/requested.   Insurance verification completed.   The patient is insured through [employer].   Per test claim: Per test claim, medication is not covered due to plan/benefit exclusion, PA not submitted at this time

## 2024-04-22 NOTE — Progress Notes (Signed)
 "    Patient Care Team: Prentiss Frieze, DO as PCP - General (Family Medicine)  Weight Management:   Starting weight: 268 lbs Starting date: 09/21/2019 Today's weight: 218 lbs Today's date: 01/18/2025 Total lbs lost to date: -50 lbs Total lbs lost since last in-office visit: -5 lbs Total weight loss percentage to date: -18.66% Body mass index is 35.28 kg/m.    Nutrition Plan: Mindfulness, focused on adequate protein.  Anti-obesity medications: Vyvanse , Zepbound . Reported side effects: none. Hunger is well controlled. Cravings are moderately controlled. Activity: FIA, walking. Sleep: Number of hours slept each night: 6. Sleep is not restful.  Patient is under the care of an Obesity Medicine-certified physician providing longitudinal care using a comprehensive, pillar-based obesity treatment model (nutrition therapy, physical activity counseling, behavioral modification, pharmacotherapy when indicated, and medical monitoring), consistent with standards outlined by the Obesity Medicine Association. Obesity is treated as a medical disease, not a cosmetic condition. Weight, BMI, waist circumference, blood pressure, relevant metabolic markers (as applicable), medication tolerability, and adherence will be monitored at regular follow-up intervals. Pharmacotherapy is expected to improve obesity-related comorbid conditions, including obstructive sleep apnea, cardiovascular risk, and/or metabolic liver disease.   Frieze Prentiss, DO, MS (Nutrition), FAAFP, Dipl. ABOM Fellow, American Academy of Family Physicians Diplomate, American Board of Obesity Medicine [facility] [address] [place], [place] [address] Dept: [phone] Fax: [phone]  Diagnoses and Orders:   1. Immunization due   2. OSA on CPAP, Rx Zepbound    3. Attention deficit hyperactivity disorder (ADHD), combined type   4. Vitamin D  deficiency   5. Genitourinary syndrome  of (peri)menopause   6. Class 2 severe obesity with serious comorbidity and body mass index (BMI) of 35.0 to 35.9 in adult, unspecified obesity type    Meds ordered this encounter  Medications   lisdexamfetamine  (VYVANSE ) 70 MG capsule    Sig: Take 1 capsule (70 mg total) by mouth in the morning.    Dispense:  30 capsule    Refill:  0   Vitamin D , Ergocalciferol , (DRISDOL ) 1.25 MG (50000 UNIT) CAPS capsule    Sig: Take 1 capsule (50,000 Units total) by mouth once a week.    Dispense:  12 capsule    Refill:  0   tirzepatide  (ZEPBOUND ) 15 MG/0.5ML Pen    Sig: Inject 15 mg into the skin once a week.    Dispense:  6 mL    Refill:  3   Orders Placed This Encounter  Procedures   Flu vaccine trivalent PF, 6mos and older(Flulaval,Afluria,Fluarix,Fluzone)   Assessment & Plan:   Assessment & Plan Routine wellness visit with focus on weight loss, muscle mass, and sleep patterns. - Continue current lifestyle modifications for weight management and muscle mass improvement.  Attention deficit hyperactivity disorder, combined type Discussed organizational tools and AI assistance for task management. - Encouraged use of organizational tools and AI assistance for task management.  Genitourinary syndrome of menopause Symptoms managed with estradiol  patch. Reports improved well-being. - Continue estradiol  patch therapy.  Class 2 obesity without serious comorbidity Weight loss of 7 pounds with increased muscle mass. Reports eating less and improved well-being.  Subjective:   Discussed the use of AI scribe software for clinical note transcription with the patient, who gave verbal consent to proceed.  History of Present Illness Katie Glass is a 49 year old female who presents with weight management and sleep issues.  Weight changes and appetite suppression - 7-pound weight loss  over a recent period - Increased muscle mass - Appetite suppression associated with use of a patch -  Occasional consumption of large meals despite appetite reduction - Overall improvement in well-being since starting the patch, though unable to clearly articulate specific changes  Sleep disturbances - Irregular sleep patterns - Earlier wake times than usual - Sleep disruption during a recent break in exercise routine - Morning awakenings frequently influenced by interactions with her six-year-old child  Visual impairment - Keratoconus - Progressive worsening of vision with age  Review of Systems: Negative, with the exception of above mentioned in HPI.  History:   Reviewed by clinician on day of visit: allergies, medications, problem list, medical history, surgical history, family history, social history, and previous encounter notes.  Medications:   Show/hide medication list[1] Allergies[2]  Objective:   BP 124/80 (BP Location: Right Arm, Cuff Size: Large)   Pulse 74   Ht 5' 6 (1.676 m)   Wt 218 lb 9.6 oz (99.2 kg)   SpO2 97%   BMI 35.28 kg/m   Physical Exam Constitutional:      General: She is not in acute distress.    Appearance: She is well-developed.  HENT:     Head: Normocephalic and atraumatic.  Eyes:     Conjunctiva/sclera: Conjunctivae normal.  Cardiovascular:     Rate and Rhythm: Normal rate and regular rhythm.     Heart sounds: Normal heart sounds.  Pulmonary:     Effort: Pulmonary effort is normal.     Breath sounds: Normal breath sounds.  Neurological:     General: No focal deficit present.     Mental Status: She is alert.  Psychiatric:        Behavior: Behavior normal.         [1]  Outpatient Medications Prior to Visit  Medication Sig   albuterol  (VENTOLIN  HFA) 108 (90 Base) MCG/ACT inhaler Inhale 1 puff into the lungs every 4 (four) hours as needed.   cetirizine (ZYRTEC) 10 MG tablet Take 10 mg by mouth daily.   conjugated estrogens  (PREMARIN ) vaginal cream Place 1 Applicatorful vaginally daily.   estradiol  (CLIMARA  - DOSED IN MG/24 HR)  0.025 mg/24hr patch Place 1 patch (0.025 mg total) onto the skin once a week.   levonorgestrel  (MIRENA ) 20 MCG/24HR IUD 1 each by Intrauterine route once.   meloxicam  (MOBIC ) 15 MG tablet TAKE 1 TABLET(15 MG) BY MOUTH DAILY   Multiple Vitamins-Minerals (WOMENS MULTI VITAMIN & MINERAL PO) Take 1 tablet by mouth daily.   nystatin -triamcinolone  (MYCOLOG II) cream Apply topically 2 times a day, as needed   predniSONE  (DELTASONE ) 50 MG tablet Take 1 tablet (50 mg total) by mouth daily.   spironolactone  (ALDACTONE ) 50 MG tablet Take 1 tablet (50 mg total) by mouth daily.   venlafaxine  (EFFEXOR ) 75 MG tablet Take 1 tablet (75 mg total) by mouth daily.   venlafaxine  XR (EFFEXOR  XR) 37.5 MG 24 hr capsule Take 1 capsule (37.5 mg total) by mouth daily with breakfast.   [DISCONTINUED] lisdexamfetamine  (VYVANSE ) 70 MG capsule Take 1 capsule (70 mg total) by mouth in the morning.   [DISCONTINUED] tirzepatide  (ZEPBOUND ) 15 MG/0.5ML Pen Inject 15 mg into the skin once a week.   [DISCONTINUED] Vitamin D , Ergocalciferol , (DRISDOL ) 1.25 MG (50000 UNIT) CAPS capsule Take 1 capsule (50,000 Units total) by mouth once a week.   No facility-administered medications prior to visit.  [2] No Known Allergies  "

## 2024-04-22 NOTE — Telephone Encounter (Signed)
 PA request has been Received. New Encounter has been or will be created for follow up. For additional info see Pharmacy Prior Auth telephone encounter from 04/22/24.

## 2024-04-23 ENCOUNTER — Other Ambulatory Visit (HOSPITAL_COMMUNITY): Payer: Self-pay

## 2024-05-12 ENCOUNTER — Ambulatory Visit: Admitting: Family Medicine

## 2024-05-24 ENCOUNTER — Ambulatory Visit: Admitting: Family Medicine
